# Patient Record
Sex: Male | Born: 1950 | Race: White | Hispanic: No | Marital: Married | Smoking: Never smoker
Health system: Southern US, Community
[De-identification: ages and names within clinical notes are randomized; demographics above are authoritative.]

## PROBLEM LIST (undated history)

## (undated) DIAGNOSIS — E785 Hyperlipidemia, unspecified: Secondary | ICD-10-CM

## (undated) DIAGNOSIS — N189 Chronic kidney disease, unspecified: Secondary | ICD-10-CM

## (undated) DIAGNOSIS — F1020 Alcohol dependence, uncomplicated: Secondary | ICD-10-CM

## (undated) DIAGNOSIS — B20 Human immunodeficiency virus [HIV] disease: Secondary | ICD-10-CM

## (undated) DIAGNOSIS — J189 Pneumonia, unspecified organism: Secondary | ICD-10-CM

## (undated) DIAGNOSIS — I10 Essential (primary) hypertension: Secondary | ICD-10-CM

## (undated) DIAGNOSIS — N529 Male erectile dysfunction, unspecified: Secondary | ICD-10-CM

## (undated) HISTORY — PX: BACK SURGERY: SHX140

## (undated) HISTORY — DX: Male erectile dysfunction, unspecified: N52.9

## (undated) HISTORY — DX: Hyperlipidemia, unspecified: E78.5

## (undated) HISTORY — DX: Pneumonia, unspecified organism: J18.9

## (undated) HISTORY — DX: Essential (primary) hypertension: I10

## (undated) HISTORY — PX: TONSILLECTOMY: SUR1361

## (undated) HISTORY — DX: Human immunodeficiency virus (HIV) disease: B20

## (undated) HISTORY — DX: Alcohol dependence, uncomplicated: F10.20

---

## 2005-11-20 ENCOUNTER — Encounter (INDEPENDENT_AMBULATORY_CARE_PROVIDER_SITE_OTHER): Payer: Self-pay | Admitting: *Deleted

## 2005-11-20 LAB — CONVERTED CEMR LAB: CD4 Count: 427 microliters

## 2006-01-08 ENCOUNTER — Ambulatory Visit: Payer: Self-pay | Admitting: Internal Medicine

## 2006-01-08 ENCOUNTER — Encounter (INDEPENDENT_AMBULATORY_CARE_PROVIDER_SITE_OTHER): Payer: Self-pay | Admitting: *Deleted

## 2006-01-08 ENCOUNTER — Encounter: Admission: RE | Admit: 2006-01-08 | Discharge: 2006-01-08 | Payer: Self-pay | Admitting: Internal Medicine

## 2006-01-23 ENCOUNTER — Ambulatory Visit: Payer: Self-pay | Admitting: Internal Medicine

## 2006-02-26 ENCOUNTER — Encounter: Admission: RE | Admit: 2006-02-26 | Discharge: 2006-02-26 | Payer: Self-pay | Admitting: Internal Medicine

## 2006-02-26 ENCOUNTER — Ambulatory Visit: Payer: Self-pay | Admitting: Internal Medicine

## 2006-02-26 ENCOUNTER — Encounter (INDEPENDENT_AMBULATORY_CARE_PROVIDER_SITE_OTHER): Payer: Self-pay | Admitting: *Deleted

## 2006-02-26 LAB — CONVERTED CEMR LAB
ALT: 31 units/L (ref 0–53)
AST: 21 units/L (ref 0–37)
Alkaline Phosphatase: 75 units/L (ref 39–117)
Basophils Absolute: 0 10*3/uL (ref 0.0–0.1)
Basophils Relative: 0 % (ref 0–1)
CD4 Count: 320 microliters
Eosinophils Absolute: 0.9 10*3/uL — ABNORMAL HIGH (ref 0.0–0.7)
Eosinophils Relative: 11 % — ABNORMAL HIGH (ref 0–5)
Glucose, Bld: 95 mg/dL (ref 70–99)
HCT: 46.5 % (ref 39.0–52.0)
HIV 1 RNA Quant: 2930 copies/mL
HIV-1 RNA Quant, Log: 3.47 — ABNORMAL HIGH (ref <1.70)
MCV: 83.2 fL (ref 78.0–100.0)
Platelets: 245 10*3/uL (ref 150–400)
RDW: 14.8 % — ABNORMAL HIGH (ref 11.5–14.0)
Sodium: 140 meq/L (ref 135–145)
Total Bilirubin: 0.3 mg/dL (ref 0.3–1.2)
Total Protein: 7.4 g/dL (ref 6.0–8.3)

## 2006-03-17 ENCOUNTER — Ambulatory Visit: Payer: Self-pay | Admitting: Internal Medicine

## 2006-04-06 ENCOUNTER — Encounter: Payer: Self-pay | Admitting: Internal Medicine

## 2006-04-20 ENCOUNTER — Encounter (INDEPENDENT_AMBULATORY_CARE_PROVIDER_SITE_OTHER): Payer: Self-pay | Admitting: *Deleted

## 2006-04-20 LAB — CONVERTED CEMR LAB

## 2006-05-03 ENCOUNTER — Encounter (INDEPENDENT_AMBULATORY_CARE_PROVIDER_SITE_OTHER): Payer: Self-pay | Admitting: *Deleted

## 2006-06-02 ENCOUNTER — Encounter: Admission: RE | Admit: 2006-06-02 | Discharge: 2006-06-02 | Payer: Self-pay | Admitting: Internal Medicine

## 2006-06-02 ENCOUNTER — Ambulatory Visit: Payer: Self-pay | Admitting: Internal Medicine

## 2006-06-02 LAB — CONVERTED CEMR LAB
AST: 26 units/L (ref 0–37)
BUN: 10 mg/dL (ref 6–23)
Basophils Relative: 1 % (ref 0–1)
CD4 Count: 430 microliters
CO2: 22 meq/L (ref 19–32)
Calcium: 9.6 mg/dL (ref 8.4–10.5)
Chloride: 108 meq/L (ref 96–112)
Creatinine, Ser: 0.98 mg/dL (ref 0.40–1.50)
HIV 1 RNA Quant: 367 copies/mL — ABNORMAL HIGH (ref <50)
HIV-1 RNA Quant, Log: 2.56 — ABNORMAL HIGH (ref <1.70)
Hemoglobin: 15.3 g/dL (ref 13.0–17.0)
MCHC: 34.8 g/dL (ref 30.0–36.0)
Monocytes Absolute: 0.5 10*3/uL (ref 0.2–0.7)
Monocytes Relative: 8 % (ref 3–11)
Neutro Abs: 3.2 10*3/uL (ref 1.7–7.7)
RBC: 5.15 M/uL (ref 4.22–5.81)

## 2006-06-16 ENCOUNTER — Encounter (INDEPENDENT_AMBULATORY_CARE_PROVIDER_SITE_OTHER): Payer: Self-pay | Admitting: *Deleted

## 2006-06-16 ENCOUNTER — Ambulatory Visit: Payer: Self-pay | Admitting: Internal Medicine

## 2006-06-16 DIAGNOSIS — G4700 Insomnia, unspecified: Secondary | ICD-10-CM | POA: Insufficient documentation

## 2006-06-16 DIAGNOSIS — B20 Human immunodeficiency virus [HIV] disease: Secondary | ICD-10-CM | POA: Insufficient documentation

## 2006-07-16 ENCOUNTER — Telehealth: Payer: Self-pay | Admitting: Internal Medicine

## 2006-07-30 ENCOUNTER — Encounter: Admission: RE | Admit: 2006-07-30 | Discharge: 2006-07-30 | Payer: Self-pay | Admitting: Internal Medicine

## 2006-07-30 ENCOUNTER — Ambulatory Visit: Payer: Self-pay | Admitting: Internal Medicine

## 2006-07-30 LAB — CONVERTED CEMR LAB
AST: 21 units/L (ref 0–37)
BUN: 20 mg/dL (ref 6–23)
Basophils Absolute: 0 10*3/uL (ref 0.0–0.1)
Basophils Relative: 0 % (ref 0–1)
CD4 Count: 430 microliters
Calcium: 9.7 mg/dL (ref 8.4–10.5)
Chloride: 110 meq/L (ref 96–112)
Creatinine, Ser: 1.27 mg/dL (ref 0.40–1.50)
Eosinophils Absolute: 0.2 10*3/uL (ref 0.0–0.7)
Lymphocytes Relative: 31 % (ref 12–46)
Lymphs Abs: 1.6 10*3/uL (ref 0.7–3.3)
Neutro Abs: 2.9 10*3/uL (ref 1.7–7.7)
Neutrophils Relative %: 55 % (ref 43–77)
Platelets: 262 10*3/uL (ref 150–400)
Total Bilirubin: 0.4 mg/dL (ref 0.3–1.2)
WBC: 5.2 10*3/uL (ref 4.0–10.5)

## 2006-08-14 ENCOUNTER — Ambulatory Visit: Payer: Self-pay | Admitting: Internal Medicine

## 2006-08-14 DIAGNOSIS — F528 Other sexual dysfunction not due to a substance or known physiological condition: Secondary | ICD-10-CM | POA: Insufficient documentation

## 2006-09-24 ENCOUNTER — Encounter (INDEPENDENT_AMBULATORY_CARE_PROVIDER_SITE_OTHER): Payer: Self-pay | Admitting: *Deleted

## 2006-09-25 ENCOUNTER — Encounter: Payer: Self-pay | Admitting: Licensed Clinical Social Worker

## 2006-09-25 ENCOUNTER — Ambulatory Visit: Payer: Self-pay | Admitting: Internal Medicine

## 2006-10-13 ENCOUNTER — Telehealth: Payer: Self-pay | Admitting: Internal Medicine

## 2006-12-09 ENCOUNTER — Encounter: Admission: RE | Admit: 2006-12-09 | Discharge: 2006-12-09 | Payer: Self-pay | Admitting: Internal Medicine

## 2006-12-09 ENCOUNTER — Ambulatory Visit: Payer: Self-pay | Admitting: Internal Medicine

## 2006-12-09 LAB — CONVERTED CEMR LAB
Albumin: 4.6 g/dL (ref 3.5–5.2)
CO2: 19 meq/L (ref 19–32)
Eosinophils Relative: 4 % (ref 0–5)
Glucose, Bld: 98 mg/dL (ref 70–99)
HCT: 45.9 % (ref 39.0–52.0)
HIV 1 RNA Quant: 75 copies/mL — ABNORMAL HIGH (ref <50)
HIV-1 RNA Quant, Log: 1.88 — ABNORMAL HIGH (ref <1.70)
Lymphocytes Relative: 26 % (ref 12–46)
Lymphs Abs: 1.1 10*3/uL (ref 0.7–3.3)
Monocytes Relative: 11 % (ref 3–11)
Neutrophils Relative %: 59 % (ref 43–77)
Platelets: 228 10*3/uL (ref 150–400)
Potassium: 4.2 meq/L (ref 3.5–5.3)
RBC: 5.1 M/uL (ref 4.22–5.81)
Sodium: 139 meq/L (ref 135–145)
Total Protein: 7.2 g/dL (ref 6.0–8.3)
WBC: 4.3 10*3/uL (ref 4.0–10.5)

## 2006-12-29 ENCOUNTER — Ambulatory Visit: Payer: Self-pay | Admitting: Internal Medicine

## 2007-02-23 ENCOUNTER — Encounter: Payer: Self-pay | Admitting: Internal Medicine

## 2007-03-16 ENCOUNTER — Encounter: Admission: RE | Admit: 2007-03-16 | Discharge: 2007-03-16 | Payer: Self-pay | Admitting: Internal Medicine

## 2007-03-16 ENCOUNTER — Ambulatory Visit: Payer: Self-pay | Admitting: Internal Medicine

## 2007-03-16 LAB — CONVERTED CEMR LAB
ALT: 38 units/L (ref 0–53)
Basophils Absolute: 0 10*3/uL (ref 0.0–0.1)
CO2: 19 meq/L (ref 19–32)
Calcium: 9.4 mg/dL (ref 8.4–10.5)
Chloride: 105 meq/L (ref 96–112)
HIV 1 RNA Quant: <50 copies/mL (ref <50)
Lymphocytes Relative: 28 % (ref 12–46)
Neutro Abs: 2.7 10*3/uL (ref 1.7–7.7)
Neutrophils Relative %: 58 % (ref 43–77)
Platelets: 254 10*3/uL (ref 150–400)
Potassium: 4.3 meq/L (ref 3.5–5.3)
RDW: 13.8 % (ref 11.5–15.5)
Sodium: 137 meq/L (ref 135–145)
Total Bilirubin: 0.3 mg/dL (ref 0.3–1.2)
Total Protein: 7.4 g/dL (ref 6.0–8.3)

## 2007-03-31 ENCOUNTER — Ambulatory Visit: Payer: Self-pay | Admitting: Internal Medicine

## 2007-04-16 ENCOUNTER — Telehealth: Payer: Self-pay | Admitting: Internal Medicine

## 2007-05-19 ENCOUNTER — Telehealth: Payer: Self-pay | Admitting: Internal Medicine

## 2007-06-30 ENCOUNTER — Ambulatory Visit: Payer: Self-pay | Admitting: Internal Medicine

## 2007-06-30 ENCOUNTER — Encounter: Admission: RE | Admit: 2007-06-30 | Discharge: 2007-06-30 | Payer: Self-pay | Admitting: Internal Medicine

## 2007-06-30 LAB — CONVERTED CEMR LAB
ALT: 32 units/L (ref 0–53)
AST: 22 units/L (ref 0–37)
Alkaline Phosphatase: 76 units/L (ref 39–117)
Basophils Absolute: 0 10*3/uL (ref 0.0–0.1)
Basophils Relative: 0 % (ref 0–1)
Chloride: 108 meq/L (ref 96–112)
Creatinine, Ser: 1.1 mg/dL (ref 0.40–1.50)
Eosinophils Relative: 2 % (ref 0–5)
HIV 1 RNA Quant: <50 copies/mL (ref <50)
Hemoglobin: 14.9 g/dL (ref 13.0–17.0)
LDL Cholesterol: 143 mg/dL — ABNORMAL HIGH (ref 0–99)
MCHC: 35.1 g/dL (ref 30.0–36.0)
Monocytes Absolute: 0.4 10*3/uL (ref 0.1–1.0)
Neutro Abs: 3.1 10*3/uL (ref 1.7–7.7)
PSA: 2.05 ng/mL (ref 0.10–4.00)
RDW: 14.4 % (ref 11.5–15.5)
Total Bilirubin: 0.5 mg/dL (ref 0.3–1.2)
Total CHOL/HDL Ratio: 5.3
VLDL: 34 mg/dL (ref 0–40)

## 2007-07-20 ENCOUNTER — Ambulatory Visit: Payer: Self-pay | Admitting: Internal Medicine

## 2007-07-20 ENCOUNTER — Encounter (INDEPENDENT_AMBULATORY_CARE_PROVIDER_SITE_OTHER): Payer: Self-pay | Admitting: *Deleted

## 2007-07-20 DIAGNOSIS — E785 Hyperlipidemia, unspecified: Secondary | ICD-10-CM | POA: Insufficient documentation

## 2007-10-19 ENCOUNTER — Ambulatory Visit: Payer: Self-pay | Admitting: Internal Medicine

## 2007-10-19 LAB — CONVERTED CEMR LAB
AST: 24 units/L (ref 0–37)
Albumin: 4.7 g/dL (ref 3.5–5.2)
Alkaline Phosphatase: 64 units/L (ref 39–117)
BUN: 12 mg/dL (ref 6–23)
Basophils Relative: 0 % (ref 0–1)
Eosinophils Absolute: 0.1 10*3/uL (ref 0.0–0.7)
Eosinophils Relative: 2 % (ref 0–5)
HDL: 47 mg/dL (ref >39)
HIV-1 RNA Quant, Log: <1.7 (ref <1.70)
LDL Cholesterol: 120 mg/dL — ABNORMAL HIGH (ref 0–99)
MCHC: 34.5 g/dL (ref 30.0–36.0)
MCV: 88.3 fL (ref 78.0–100.0)
Neutrophils Relative %: 63 % (ref 43–77)
Platelets: 241 10*3/uL (ref 150–400)
Potassium: 4.2 meq/L (ref 3.5–5.3)
Sodium: 142 meq/L (ref 135–145)
Total Bilirubin: 0.3 mg/dL (ref 0.3–1.2)
Total Protein: 7.2 g/dL (ref 6.0–8.3)
VLDL: 61 mg/dL — ABNORMAL HIGH (ref 0–40)

## 2007-11-09 ENCOUNTER — Ambulatory Visit: Payer: Self-pay | Admitting: Internal Medicine

## 2008-02-07 ENCOUNTER — Ambulatory Visit: Payer: Self-pay | Admitting: Internal Medicine

## 2008-02-07 LAB — CONVERTED CEMR LAB
ALT: 38 units/L (ref 0–53)
AST: 22 units/L (ref 0–37)
Alkaline Phosphatase: 62 units/L (ref 39–117)
Basophils Absolute: 0 10*3/uL (ref 0.0–0.1)
Basophils Relative: 1 % (ref 0–1)
Cholesterol: 231 mg/dL — ABNORMAL HIGH (ref 0–200)
Creatinine, Ser: 1.09 mg/dL (ref 0.40–1.50)
Eosinophils Absolute: 0.2 10*3/uL (ref 0.0–0.7)
Eosinophils Relative: 3 % (ref 0–5)
HCT: 44.4 % (ref 39.0–52.0)
HIV-1 RNA Quant, Log: <1.68 (ref <1.68)
Hemoglobin: 14.8 g/dL (ref 13.0–17.0)
LDL Cholesterol: 145 mg/dL — ABNORMAL HIGH (ref 0–99)
Lymphocytes Relative: 26 % (ref 12–46)
MCHC: 33.3 g/dL (ref 30.0–36.0)
Monocytes Absolute: 0.4 10*3/uL (ref 0.1–1.0)
Platelets: 229 10*3/uL (ref 150–400)
RDW: 14.1 % (ref 11.5–15.5)
Sodium: 141 meq/L (ref 135–145)
Total Bilirubin: 0.3 mg/dL (ref 0.3–1.2)
Total CHOL/HDL Ratio: 5.6
Total Protein: 6.8 g/dL (ref 6.0–8.3)
VLDL: 45 mg/dL — ABNORMAL HIGH (ref 0–40)

## 2008-02-22 ENCOUNTER — Ambulatory Visit: Payer: Self-pay | Admitting: Internal Medicine

## 2008-03-08 ENCOUNTER — Encounter: Payer: Self-pay | Admitting: Internal Medicine

## 2008-03-16 ENCOUNTER — Encounter: Payer: Self-pay | Admitting: Internal Medicine

## 2008-05-01 ENCOUNTER — Ambulatory Visit: Payer: Self-pay | Admitting: Infectious Diseases

## 2008-05-01 ENCOUNTER — Encounter: Payer: Self-pay | Admitting: Internal Medicine

## 2008-05-01 LAB — CONVERTED CEMR LAB
AST: 14 units/L (ref 0–37)
Alkaline Phosphatase: 62 units/L (ref 39–117)
BUN: 15 mg/dL (ref 6–23)
Band Neutrophils: 0 % (ref 0–10)
Basophils Absolute: 0 10*3/uL (ref 0.0–0.1)
Basophils Relative: 1 % (ref 0–1)
Eosinophils Relative: 3 % (ref 0–5)
Glucose, Bld: 99 mg/dL (ref 70–99)
HCT: 46.1 % (ref 39.0–52.0)
HDL: 44 mg/dL (ref >39)
LDL Cholesterol: 140 mg/dL — ABNORMAL HIGH (ref 0–99)
Lymphocytes Relative: 13 % (ref 12–46)
Monocytes Absolute: 0.6 10*3/uL (ref 0.1–1.0)
Neutro Abs: 4.6 10*3/uL (ref 1.7–7.7)
Platelets: 205 10*3/uL (ref 150–400)
RDW: 14.1 % (ref 11.5–15.5)
Sodium: 139 meq/L (ref 135–145)
Total Bilirubin: 0.4 mg/dL (ref 0.3–1.2)
Triglycerides: 170 mg/dL — ABNORMAL HIGH (ref <150)
VLDL: 34 mg/dL (ref 0–40)
WBC: 6.2 10*3/uL (ref 4.0–10.5)

## 2008-05-22 ENCOUNTER — Ambulatory Visit: Payer: Self-pay | Admitting: Internal Medicine

## 2008-05-22 LAB — CONVERTED CEMR LAB
Alkaline Phosphatase: 59 units/L (ref 39–117)
BUN: 17 mg/dL (ref 6–23)
Basophils Absolute: 0 10*3/uL (ref 0.0–0.1)
CO2: 21 meq/L (ref 19–32)
Cholesterol: 192 mg/dL (ref 0–200)
Creatinine, Ser: 1.12 mg/dL (ref 0.40–1.50)
GFR calc Af Amer: >60 mL/min (ref >60)
GFR calc non Af Amer: >60 mL/min (ref >60)
Glucose, Bld: 98 mg/dL (ref 70–99)
HDL: 47 mg/dL (ref >39)
Hemoglobin: 15 g/dL (ref 13.0–17.0)
Lymphocytes Relative: 29 % (ref 12–46)
Lymphs Abs: 1.3 10*3/uL (ref 0.7–4.0)
Monocytes Absolute: 0.4 10*3/uL (ref 0.1–1.0)
Neutro Abs: 2.6 10*3/uL (ref 1.7–7.7)
RBC: 4.92 M/uL (ref 4.22–5.81)
RDW: 14 % (ref 11.5–15.5)
Total Bilirubin: 0.3 mg/dL (ref 0.3–1.2)
Total Protein: 7.2 g/dL (ref 6.0–8.3)
Triglycerides: 225 mg/dL — ABNORMAL HIGH (ref <150)
VLDL: 45 mg/dL — ABNORMAL HIGH (ref 0–40)
WBC: 4.4 10*3/uL (ref 4.0–10.5)

## 2008-06-06 ENCOUNTER — Ambulatory Visit: Payer: Self-pay | Admitting: Internal Medicine

## 2008-06-29 ENCOUNTER — Telehealth: Payer: Self-pay | Admitting: Internal Medicine

## 2008-09-04 ENCOUNTER — Ambulatory Visit: Payer: Self-pay | Admitting: Internal Medicine

## 2008-09-04 LAB — CONVERTED CEMR LAB
Albumin: 4.7 g/dL (ref 3.5–5.2)
CO2: 21 meq/L (ref 19–32)
Cholesterol: 254 mg/dL — ABNORMAL HIGH (ref 0–200)
Glucose, Bld: 92 mg/dL (ref 70–99)
HDL: 38 mg/dL — ABNORMAL LOW (ref >39)
Lymphs Abs: 1.4 10*3/uL (ref 0.7–4.0)
Monocytes Relative: 9 % (ref 3–12)
Neutro Abs: 3.3 10*3/uL (ref 1.7–7.7)
Neutrophils Relative %: 63 % (ref 43–77)
Potassium: 4.2 meq/L (ref 3.5–5.3)
RBC: 5.29 M/uL (ref 4.22–5.81)
Sodium: 140 meq/L (ref 135–145)
Total Bilirubin: 0.3 mg/dL (ref 0.3–1.2)
Total CHOL/HDL Ratio: 6.7
Total Protein: 7 g/dL (ref 6.0–8.3)
WBC: 5.3 10*3/uL (ref 4.0–10.5)

## 2008-10-06 ENCOUNTER — Ambulatory Visit: Payer: Self-pay | Admitting: Internal Medicine

## 2008-12-12 ENCOUNTER — Ambulatory Visit: Payer: Self-pay | Admitting: Internal Medicine

## 2008-12-12 LAB — CONVERTED CEMR LAB
ALT: 41 units/L (ref 0–53)
Albumin: 4.5 g/dL (ref 3.5–5.2)
CO2: 20 meq/L (ref 19–32)
Calcium: 9.2 mg/dL (ref 8.4–10.5)
Chloride: 109 meq/L (ref 96–112)
Cholesterol: 214 mg/dL — ABNORMAL HIGH (ref 0–200)
Creatinine, Ser: 1.12 mg/dL (ref 0.40–1.50)
Eosinophils Absolute: 0.1 10*3/uL (ref 0.0–0.7)
HCT: 44.7 % (ref 39.0–52.0)
Lymphs Abs: 1.1 10*3/uL (ref 0.7–4.0)
MCV: 87.6 fL (ref >=78.0)
Monocytes Relative: 9 % (ref 3–12)
Neutrophils Relative %: 63 % (ref 43–77)
Potassium: 4.2 meq/L (ref 3.5–5.3)
RBC: 5.1 M/uL (ref 4.22–5.81)
Total CHOL/HDL Ratio: 4.9
Total Protein: 7 g/dL (ref 6.0–8.3)
WBC: 4.4 10*3/uL (ref 4.0–10.5)

## 2008-12-26 ENCOUNTER — Ambulatory Visit: Payer: Self-pay | Admitting: Internal Medicine

## 2008-12-26 DIAGNOSIS — I10 Essential (primary) hypertension: Secondary | ICD-10-CM | POA: Insufficient documentation

## 2009-03-26 ENCOUNTER — Ambulatory Visit: Payer: Self-pay | Admitting: Internal Medicine

## 2009-03-26 LAB — CONVERTED CEMR LAB
Basophils Relative: 0 % (ref 0–1)
CO2: 20 meq/L (ref 19–32)
Calcium: 8.9 mg/dL (ref 8.4–10.5)
Chloride: 108 meq/L (ref 96–112)
Cholesterol: 216 mg/dL — ABNORMAL HIGH (ref 0–200)
Creatinine, Ser: 0.99 mg/dL (ref 0.40–1.50)
Eosinophils Absolute: 0.1 10*3/uL (ref 0.0–0.7)
Eosinophils Relative: 1 % (ref 0–5)
Glucose, Bld: 94 mg/dL (ref 70–99)
HCT: 44.8 % (ref 39.0–52.0)
HIV-1 RNA Quant, Log: <1.68 (ref <1.68)
Hemoglobin: 15.4 g/dL (ref 13.0–17.0)
Lymphs Abs: 1 10*3/uL (ref 0.7–4.0)
MCHC: 34.4 g/dL (ref 30.0–36.0)
MCV: 86.3 fL (ref >=78.0)
Monocytes Absolute: 0.4 10*3/uL (ref 0.1–1.0)
Monocytes Relative: 8 % (ref 3–12)
RBC: 5.19 M/uL (ref 4.22–5.81)
Total Bilirubin: 0.3 mg/dL (ref 0.3–1.2)
Total CHOL/HDL Ratio: 5.1
Triglycerides: 317 mg/dL — ABNORMAL HIGH (ref <150)
VLDL: 63 mg/dL — ABNORMAL HIGH (ref 0–40)

## 2009-04-10 ENCOUNTER — Ambulatory Visit: Payer: Self-pay | Admitting: Internal Medicine

## 2009-07-10 ENCOUNTER — Ambulatory Visit: Payer: Self-pay | Admitting: Internal Medicine

## 2009-07-10 LAB — CONVERTED CEMR LAB
AST: 22 units/L (ref 0–37)
Albumin: 4.5 g/dL (ref 3.5–5.2)
BUN: 11 mg/dL (ref 6–23)
CO2: 22 meq/L (ref 19–32)
Calcium: 9.4 mg/dL (ref 8.4–10.5)
Chloride: 107 meq/L (ref 96–112)
Creatinine, Ser: 1.04 mg/dL (ref 0.40–1.50)
Eosinophils Absolute: 0.1 10*3/uL (ref 0.0–0.7)
Eosinophils Relative: 1 % (ref 0–5)
Glucose, Bld: 98 mg/dL (ref 70–99)
HCT: 43.6 % (ref 39.0–52.0)
HIV 1 RNA Quant: <48 copies/mL (ref <48)
HIV-1 RNA Quant, Log: <1.68 (ref <1.68)
Hemoglobin: 15.2 g/dL (ref 13.0–17.0)
Lymphocytes Relative: 20 % (ref 12–46)
Lymphs Abs: 1.1 10*3/uL (ref 0.7–4.0)
MCV: 85.8 fL (ref 78.0–100.0)
Monocytes Absolute: 0.4 10*3/uL (ref 0.1–1.0)
Platelets: 243 10*3/uL (ref 150–400)
Potassium: 4.3 meq/L (ref 3.5–5.3)
RDW: 14.1 % (ref 11.5–15.5)
WBC: 5.8 10*3/uL (ref 4.0–10.5)

## 2009-07-11 ENCOUNTER — Telehealth: Payer: Self-pay | Admitting: Internal Medicine

## 2009-07-24 ENCOUNTER — Ambulatory Visit: Payer: Self-pay | Admitting: Internal Medicine

## 2009-07-30 ENCOUNTER — Encounter (INDEPENDENT_AMBULATORY_CARE_PROVIDER_SITE_OTHER): Payer: Self-pay | Admitting: *Deleted

## 2009-10-10 ENCOUNTER — Ambulatory Visit: Payer: Self-pay | Admitting: Internal Medicine

## 2009-10-10 LAB — CONVERTED CEMR LAB
Albumin: 4.8 g/dL (ref 3.5–5.2)
Alkaline Phosphatase: 68 units/L (ref 39–117)
BUN: 19 mg/dL (ref 6–23)
Calcium: 9.5 mg/dL (ref 8.4–10.5)
Chloride: 109 meq/L (ref 96–112)
Eosinophils Absolute: 0 10*3/uL (ref 0.0–0.7)
Glucose, Bld: 98 mg/dL (ref 70–99)
HIV-1 RNA Quant, Log: <1.68 (ref <1.68)
LDL Cholesterol: 153 mg/dL — ABNORMAL HIGH (ref 0–99)
Lymphocytes Relative: 25 % (ref 12–46)
Lymphs Abs: 1.4 10*3/uL (ref 0.7–4.0)
MCV: 89.1 fL (ref 78.0–100.0)
Monocytes Relative: 8 % (ref 3–12)
Neutro Abs: 3.6 10*3/uL (ref 1.7–7.7)
Neutrophils Relative %: 66 % (ref 43–77)
Platelets: 229 10*3/uL (ref 150–400)
Potassium: 4.2 meq/L (ref 3.5–5.3)
RBC: 5.22 M/uL (ref 4.22–5.81)
Sodium: 141 meq/L (ref 135–145)
Total Protein: 7.4 g/dL (ref 6.0–8.3)
Triglycerides: 249 mg/dL — ABNORMAL HIGH (ref <150)
WBC: 5.5 10*3/uL (ref 4.0–10.5)

## 2009-10-24 ENCOUNTER — Ambulatory Visit: Payer: Self-pay | Admitting: Internal Medicine

## 2009-11-23 ENCOUNTER — Telehealth: Payer: Self-pay | Admitting: Internal Medicine

## 2010-01-23 ENCOUNTER — Ambulatory Visit: Payer: Self-pay | Admitting: Internal Medicine

## 2010-01-23 LAB — CONVERTED CEMR LAB
ALT: 34 units/L (ref 0–53)
Albumin: 4.3 g/dL (ref 3.5–5.2)
Alkaline Phosphatase: 57 units/L (ref 39–117)
Basophils Absolute: 0 10*3/uL (ref 0.0–0.1)
Basophils Relative: 0 % (ref 0–1)
CO2: 23 meq/L (ref 19–32)
Cholesterol: 233 mg/dL — ABNORMAL HIGH (ref 0–200)
Eosinophils Absolute: 0.1 10*3/uL (ref 0.0–0.7)
LDL Cholesterol: 139 mg/dL — ABNORMAL HIGH (ref 0–99)
MCHC: 34.2 g/dL (ref 30.0–36.0)
MCV: 87.3 fL (ref 78.0–100.0)
Neutrophils Relative %: 67 % (ref 43–77)
Platelets: 221 10*3/uL (ref 150–400)
Potassium: 4.2 meq/L (ref 3.5–5.3)
RDW: 14.1 % (ref 11.5–15.5)
Sodium: 143 meq/L (ref 135–145)
Total Bilirubin: 0.3 mg/dL (ref 0.3–1.2)
Total Protein: 6.4 g/dL (ref 6.0–8.3)
Triglycerides: 276 mg/dL — ABNORMAL HIGH (ref <150)

## 2010-03-06 ENCOUNTER — Ambulatory Visit
Admission: RE | Admit: 2010-03-06 | Discharge: 2010-03-06 | Payer: Self-pay | Source: Home / Self Care | Attending: Internal Medicine | Admitting: Internal Medicine

## 2010-03-26 NOTE — Miscellaneous (Signed)
Summary: clinical update/ryan white  Clinical Lists Changes  Observations: Added new observation of PCTFPL: 305.08  (07/30/2009 14:47) Added new observation of HOUSEINCOME: 16109  (07/30/2009 14:47) Added new observation of FINASSESSDT: 07/24/2009  (07/30/2009 14:47) Added new observation of YEARLYEXPEN: 60454  (07/30/2009 14:47) Added new observation of RW VITAL STA: Active  (07/30/2009 14:47)

## 2010-03-26 NOTE — Assessment & Plan Note (Signed)
Summary: 41month f/u/vs   Primary Provider:  Philipp Deputy  CC:  follow-up visit and lab results.  History of Present Illness: Pt doing well.  He missed his BP medication and Atripla this weekend because he was moving his son.  Preventive Screening-Counseling & Management  Alcohol-Tobacco     Alcohol drinks/day: 2     Alcohol type: beer     Smoking Status: never     Passive Smoke Exposure: yes  Caffeine-Diet-Exercise     Caffeine use/day: yes, 2 cups of coffee in the mornings     Does Patient Exercise: yes     Type of exercise: active with grandkids     Exercise (avg: min/session): 30-60     Times/week: <3  Hep-HIV-STD-Contraception     HIV Risk: no  Safety-Violence-Falls     Seat Belt Use: yes      Sexual History:  currently monogamous.        Drug Use:  never.        Blood Transfusions:  no.        Travel History:  NONE.     Updated Prior Medication List: ATRIPLA 600-200-300 MG TABS (EFAVIRENZ-EMTRICITAB-TENOFOVIR) Take 1 tablet by mouth at bedtime ATENOLOL 100 MG  TABS (ATENOLOL) Take 1 tablet by mouth once a day ATIVAN 0.5 MG TABS (LORAZEPAM) Take 1 tablet by mouth at bedtime  Current Allergies (reviewed today): ! SULFA Past History:  Past Medical History: Last updated: 05/01/2008 HIV disease Depression Insomnia Erectile dysfunction Laminectomy  Review of Systems       The patient complains of headaches.  The patient denies anorexia, fever, weight loss, and chest pain.    Vital Signs:  Patient profile:   60 year old male Height:      68 inches (172.72 cm) Weight:      188.0 pounds (85.45 kg) BMI:     28.69 Temp:     98.0 degrees F (36.67 degrees C) oral Pulse rate:   75 / minute BP sitting:   162 / 90  (right arm)  Vitals Entered By: Wendall Mola CMA Duncan Dull) (Jul 24, 2009 3:49 PM) CC: follow-up visit, lab results Is Patient Diabetic? No Pain Assessment Patient in pain? no      Nutritional Status BMI of 25 - 29 =  overweight Nutritional Status Detail appetite "good"  Does patient need assistance? Functional Status Self care Ambulation Normal Comments 2 missed doses of meds per patient   Physical Exam  General:  alert, well-developed, well-nourished, and well-hydrated.   Head:  normocephalic and atraumatic.   Mouth:  pharynx pink and moist.   Lungs:  normal breath sounds.      Impression & Recommendations:  Problem # 1:  HIV DISEASE (ICD-042) Pt.s most recent CD4ct was 520 and VL<48  .  Pt instructed to continue the current antiretroviral regimen.  Pt encouraged to take medication regularly and not miss doses.  Pt will f/u in 3 months for repeat blood work and will see me 2 weeks later.  Diagnostics Reviewed:  CD4: 520 (07/11/2009)   WBC: 5.8 (07/10/2009)   PMN (bands): 0 (05/01/2008)   Hgb: 15.2 (07/10/2009)   HCT: 43.6 (07/10/2009)   Platelets: 243 (07/10/2009) HIV-1 RNA: <48 copies/mL (07/10/2009)   HBSAg: NO (04/20/2006)  Problem # 2:  HYPERTENSION, BENIGN (ICD-401.1) discussed need to take his BP meds every day. His updated medication list for this problem includes:    Atenolol 100 Mg Tabs (Atenolol) .Marland Kitchen... Take 1 tablet by mouth once  a day  Other Orders: Est. Patient Level III ([address]) Future Orders: T-CD4SP ([facility]) (CD4SP) ... 10/22/2009 T-HIV Viral Load [id number]) ... 10/22/2009 T-Comprehensive Metabolic Panel [id number]) ... 10/22/2009 T-CBC w/Diff ([id number]) ... 10/22/2009 T-RPR (Syphilis) [id number]) ... 10/22/2009 T-Lipid Profile [id number]) ... 10/22/2009  Patient Instructions: 1)  Please schedule a follow-up appointment in 3 months, 2 weeks after labs.

## 2010-03-26 NOTE — Progress Notes (Signed)
Summary: new pharmacy-new prescription  Phone Note From Pharmacy   [facility] Details for Reason: need new prescription--new patient to pharmacy Summary of Call: received [facility] requesting a new prescription for Atripla.  this patient is new to their pharmacy.  there phone number is [phone] Initial call taken by: Paulo Fruit  BS,CPht II,MPH,  November 23, 2009 11:22 AM    Prescriptions: ATRIPLA 600-200-300 MG TABS (EFAVIRENZ-EMTRICITAB-TENOFOVIR) Take 1 tablet by mouth at bedtime  #30 Tablet x 5   Entered by:   Paulo Fruit  BS,CPht II,MPH   Authorized by:   Yisroel Ramming MD   Signed by:   Paulo Fruit  BS,CPht II,MPH on 11/23/2009   Method used:   Telephoned to ...       [facility]             , [place]         Ph:        Fax: [phone]   RxID:   [id number]  spoke to [facility] who read the prescription back/mld Paulo Fruit  BS,CPht II,MPH  November 23, 2009 11:28 AM

## 2010-03-26 NOTE — Progress Notes (Signed)
Summary: Request for Ativan  Phone Note From Pharmacy   Caller: [facility] [address] Reason for Call: Needs renewal Summary of Call: Requesting refill on the Ativan.  Patient has an appt May 31. Initial call taken by: Paulo Fruit  BS,CPht II,MPH,  Jul 11, 2009 9:31 AM  Follow-up for Phone Call        ok x 1 Follow-up by: Yisroel Ramming MD,  Jul 11, 2009 9:54 AM

## 2010-03-26 NOTE — Assessment & Plan Note (Signed)
Summary: 79month f/u [mkj]   Primary Gweneth Fredlund:  Philipp Deputy  CC:  follow-up visit and lab results.  History of Present Illness: Pt here for f/u. No missed doses.  Preventive Screening-Counseling & Management  Alcohol-Tobacco     Alcohol drinks/day: 2     Alcohol type: beer     Smoking Status: never     Passive Smoke Exposure: yes  Caffeine-Diet-Exercise     Caffeine use/day: yes, 2 cups of coffee in the mornings     Does Patient Exercise: yes     Type of exercise: active with grandkids     Exercise (avg: min/session): 30-60     Times/week: <3  Hep-HIV-STD-Contraception     HIV Risk: no  Safety-Violence-Falls     Seat Belt Use: yes      Sexual History:  currently monogamous.        Drug Use:  never.        Blood Transfusions:  no.        Travel History:  NONE.     Updated Prior Medication List: ATRIPLA 600-200-300 MG TABS (EFAVIRENZ-EMTRICITAB-TENOFOVIR) Take 1 tablet by mouth at bedtime ATENOLOL 100 MG  TABS (ATENOLOL) Take 1 tablet by mouth once a day  Current Allergies (reviewed today): ! SULFA Past History:  Past Medical History: Last updated: 05/01/2008 HIV disease Depression Insomnia Erectile dysfunction Laminectomy  Review of Systems  The patient denies anorexia, fever, and weight loss.    Vital Signs:  Patient profile:   60 year old male Height:      68 inches (172.72 cm) Weight:      185.8 pounds (84.45 kg) BMI:     28.35 Temp:     98.3 degrees F (36.83 degrees C) oral Pulse rate:   73 / minute BP sitting:   159 / 90  (left arm)  Vitals Entered By: Wendall Mola CMA Duncan Dull) (October 24, 2009 3:50 PM) CC: follow-up visit, lab results Is Patient Diabetic? No Pain Assessment Patient in pain? no      Nutritional Status BMI of 25 - 29 = overweight Nutritional Status Detail appetite "good"  Does patient need assistance? Functional Status Self care Ambulation Normal Comments no missed doses of meds per patient   Physical  Exam  General:  alert, well-developed, well-nourished, and well-hydrated.   Head:  normocephalic and atraumatic.   Lungs:  normal breath sounds.      Impression & Recommendations:  Problem # 1:  HIV DISEASE (ICD-042) Pt.s most recent CD4ct was 520 and VL <48 .  Pt instructed to continue the current antiretroviral regimen.  Pt encouraged to take medication regularly and not miss doses.  Pt will f/u in 3 months for repeat blood work and will see me 2 weeks later.  Diagnostics Reviewed:  CD4: 520 (10/11/2009)   WBC: 5.5 (10/10/2009)   PMN (bands): 0 (05/01/2008)   Hgb: 15.8 (10/10/2009)   HCT: 46.5 (10/10/2009)   Platelets: 229 (10/10/2009) HIV-1 RNA: <48 copies/mL (10/10/2009)   HBSAg: NO (04/20/2006)  Problem # 2:  HYPERLIPIDEMIA (ICD-272.4) discussed diet he has been eating a lot of take out food lately - will cut back  Medications Added to Medication List This Visit: 1)  Ativan 0.5 Mg Tabs (Lorazepam) .... Take 1 tablet by mouth at bedtime  Other Orders: Est. Patient Level III (04540) Future Orders: T-CD4SP ([facility]) (CD4SP) ... 01/22/2010 T-HIV Viral Load [id number]) ... 01/22/2010 T-Comprehensive Metabolic Panel [id number]) ... 01/22/2010 T-CBC w/Diff ([id number]) ... 01/22/2010 T-Lipid Profile [id number]) .Marland KitchenMarland Kitchen  01/22/2010  Patient Instructions: 1)  Please schedule a follow-up appointment in 3 months, 2 weeks after labs  Prescriptions: ATIVAN 0.5 MG TABS (LORAZEPAM) Take 1 tablet by mouth at bedtime  #30 x 0   Entered and Authorized by:   Yisroel Ramming MD   Signed by:   Yisroel Ramming MD on 10/24/2009   Method used:   Print then Give to Patient   RxID:   [id number] ATRIPLA 600-200-300 MG TABS (EFAVIRENZ-EMTRICITAB-TENOFOVIR) Take 1 tablet by mouth at bedtime  #30 Tablet x 5   Entered and Authorized by:   Yisroel Ramming MD   Signed by:   Yisroel Ramming MD on 10/24/2009   Method used:   Print then Give to Patient   RxID:   [id number]

## 2010-03-26 NOTE — Assessment & Plan Note (Signed)
Summary: F/U/VS   Primary Provider:  Philipp Deputy  CC:  f/u labs and headache x 1 week.  History of Present Illness: Pt here for f/u.  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 Has had a HA for the past week due to stress. No missed doses of his HIV meds.  Preventive Screening-Counseling & Management  Alcohol-Tobacco     Alcohol drinks/day: 0     Alcohol type: beer     Smoking Status: quit     Passive Smoke Exposure: yes  Caffeine-Diet-Exercise     Caffeine use/day: yes, 2 cups of coffee in the mornings     Does Patient Exercise: yes     Type of exercise: active with grandkids     Exercise (avg: min/session): 30-60     Times/week: <3   Updated Prior Medication List: ATRIPLA 600-200-300 MG TABS (EFAVIRENZ-EMTRICITAB-TENOFOVIR) Take 1 tablet by mouth at bedtime ATENOLOL 100 MG  TABS (ATENOLOL) Take 1 tablet by mouth once a day ATIVAN 0.5 MG TABS (LORAZEPAM) Take 1 tablet by mouth at bedtime VICODIN 5-500 MG TABS (HYDROCODONE-ACETAMINOPHEN) Take 1 tablet by mouth every 6 hours as needed  Current Allergies (reviewed today): ! SULFA Past History:  Past Medical History: Last updated: 05/01/2008 HIV disease Depression Insomnia Erectile dysfunction Laminectomy  Review of Systems  The patient denies anorexia, fever, and weight loss.    Vital Signs:  Patient profile:   60 year old male Height:      68 inches (172.72 cm) Weight:      184 pounds (83.64 kg) BMI:     28.08 Pulse rate:   55 / minute BP sitting:   137 / 84  (left arm)  Vitals Entered By: Starleen Arms CMA (April 10, 2009 3:36 PM) CC: f/u labs, headache x 1 week Is Patient Diabetic? No Pain Assessment Patient in pain? no      Nutritional Status BMI of 25 - 29 = overweight Nutritional Status Detail nl  Have you ever been in a relationship where you felt threatened, hurt or afraid?No   Does patient need assistance? Functional Status Self care Ambulation  Normal   Physical Exam  General:  alert, well-developed, well-nourished, and well-hydrated.   Head:  normocephalic and atraumatic.   Mouth:  pharynx pink and moist.   Lungs:  normal breath sounds.      Impression & Recommendations:  Problem # 1:  HIV DISEASE (ICD-042) Pt.s most recent CD4ct was 440 and VL <48 .  Pt instructed to continue the current antiretroviral regimen.  Pt encouraged to take medication regularly and not miss doses.  Pt will f/u in 3 months for repeat blood work and will see me 2 weeks later.  Diagnostics Reviewed:  CD4: 440 (03/27/2009)   WBC: 5.2 (03/26/2009)   PMN (bands): 0 (05/01/2008)   Hgb: 15.4 (03/26/2009)   HCT: 44.8 (03/26/2009)   Platelets: 231 (03/26/2009) HIV-1 RNA: <48 copies/mL (03/26/2009)   HBSAg: NO (04/20/2006)  Problem # 2:  HYPERLIPIDEMIA (ICD-272.4) stable continue dietary restrictions Labs Reviewed: SGOT: 25 (03/26/2009)   SGPT: 34 (03/26/2009)   HDL:42 (03/26/2009), 44 (12/12/2008)  LDL:111 (03/26/2009), 112 (12/12/2008)  Chol:216 (03/26/2009), 214 (12/12/2008)  Trig:317 (03/26/2009), 292 (12/12/2008)  Other Orders: Est. Patient Level III (27253) Future Orders: T-CD4SP ([facility]) (CD4SP) ... 07/09/2009 T-HIV Viral Load [id number]) ... 07/09/2009 T-Comprehensive Metabolic Panel [id number]) ... 07/09/2009 T-CBC w/Diff ([id number]) ... 07/09/2009  Patient Instructions: 1)  Please schedule a follow-up appointment in 3 months, 2 weeks after  labs.

## 2010-03-28 NOTE — Assessment & Plan Note (Signed)
Summary: F/U/VS   Primary Provider:  Philipp Deputy  CC:  follow-up visit, lab results, B/P elevated, and has not been taking Atenolol regularly.  History of Present Illness: patient is here for follow-up on his lab results.  He's been taking his a triple everyday.  He has not been taking his blood pressure medication every day.  He thinks his blood pressure may be elevated because he was rushing to get here on time.  Preventive Screening-Counseling & Management  Alcohol-Tobacco     Alcohol drinks/day: 2     Alcohol type: beer     Smoking Status: never     Passive Smoke Exposure: yes  Caffeine-Diet-Exercise     Caffeine use/day: yes, 2 cups of coffee in the mornings     Does Patient Exercise: yes     Type of exercise: active with grandkids     Exercise (avg: min/session): 30-60     Times/week: <3  Hep-HIV-STD-Contraception     HIV Risk: no  Safety-Violence-Falls     Seat Belt Use: yes      Sexual History:  currently monogamous.        Drug Use:  never.        Blood Transfusions:  no.        Travel History:  NONE.    Comments: pt. given condoms   Updated Prior Medication List: ATRIPLA 600-200-300 MG TABS (EFAVIRENZ-EMTRICITAB-TENOFOVIR) Take 1 tablet by mouth at bedtime ATENOLOL 100 MG  TABS (ATENOLOL) Take 1 tablet by mouth once a day ATIVAN 0.5 MG TABS (LORAZEPAM) Take 1 tablet by mouth at bedtime LIPITOR 20 MG TABS (ATORVASTATIN CALCIUM) Take 1 tablet by mouth once a day  Current Allergies (reviewed today): ! SULFA Past History:  Past Medical History: Last updated: 05/01/2008 HIV disease Depression Insomnia Erectile dysfunction Laminectomy  Review of Systems  The patient denies anorexia, fever, weight loss, chest pain, and headaches.    Vital Signs:  Patient profile:   60 year old male Height:      68 inches (172.72 cm) Weight:      179.0 pounds (81.36 kg) BMI:     27.32 Temp:     98.2 degrees F (36.78 degrees C) oral Pulse rate:   69 /  minute BP sitting:   159 / 110  (right arm)  Vitals Entered By: Wendall Mola CMA Duncan Dull) (March 06, 2010 4:11 PM) CC: follow-up visit, lab results, B/P elevated, has not been taking Atenolol regularly Is Patient Diabetic? No Pain Assessment Patient in pain? no      Nutritional Status BMI of 25 - 29 = overweight Nutritional Status Detail appetite "too good"  Have you ever been in a relationship where you felt threatened, hurt or afraid?No   Does patient need assistance? Functional Status Self care Ambulation Normal Comments no missed doses of Atripla per pt.   Physical Exam  General:  alert, well-developed, well-nourished, and well-hydrated.  alert, well-developed, well-nourished, and well-hydrated.   Head:  normocephalic and atraumatic.  normocephalic and atraumatic.   Mouth:  pharynx pink and moist.  pharynx pink and moist.   Lungs:  normal breath sounds.  normal breath sounds.   Heart:  normal rate and regular rhythm.  normal rate and regular rhythm.     Impression & Recommendations:  Problem # 1:  HIV DISEASE (ICD-042) Pt.s most recent CD4ct was 510 and VL <20 .  Pt instructed to continue the current antiretroviral regimen.  Pt encouraged to take medication regularly and not  miss doses.  Pt will f/u in 3 months for repeat blood work and will see me 2 weeks later. Influenza vaccine given.  Diagnostics Reviewed:  HIV: HIV positive - not AIDS (10/24/2009)   CD4: 510 (01/25/2010)   WBC: 5.0 (01/23/2010)   PMN (bands): 0 (05/01/2008)   Hgb: 15.0 (01/23/2010)   HCT: 43.8 (01/23/2010)   Platelets: 221 (01/23/2010) HIV-1 RNA: <20 copies/mL (01/23/2010)   HBSAg: NO (04/20/2006)  Problem # 2:  HYPERTENSION, BENIGN (ICD-401.1) Pt encouraged to take his atenolol every day. His updated medication list for this problem includes:    Atenolol 100 Mg Tabs (Atenolol) .Marland Kitchen... Take 1 tablet by mouth once a day  Problem # 3:  HYPERLIPIDEMIA (ICD-272.4)  will start lipitor and  repea labs in 3 months. His updated medication list for this problem includes:    Lipitor 20 Mg Tabs (Atorvastatin calcium) .Marland Kitchen... Take 1 tablet by mouth once a day  Labs Reviewed: SGOT: 25 (01/23/2010)   SGPT: 34 (01/23/2010)   HDL:39 (01/23/2010), 46 (10/10/2009)  LDL:139 (01/23/2010), 153 (10/10/2009)  Chol:233 (01/23/2010), 249 (10/10/2009)  Trig:276 (01/23/2010), 249 (10/10/2009)  Medications Added to Medication List This Visit: 1)  Lipitor 20 Mg Tabs (Atorvastatin calcium) .... Take 1 tablet by mouth once a day  Other Orders: Est. Patient Level III (04540) Future Orders: T-CD4SP ([facility]) (CD4SP) ... 06/04/2010 T-HIV Viral Load [id number]) ... 06/04/2010 T-Comprehensive Metabolic Panel [id number]) ... 06/04/2010 T-CBC w/Diff ([id number]) ... 06/04/2010 T-Lipid Profile [id number]) ... 06/04/2010  Patient Instructions: 1)  Please schedule a follow-up appointment in 3 months, 2 weeks after labs.  Prescriptions: ATIVAN 0.5 MG TABS (LORAZEPAM) Take 1 tablet by mouth at bedtime  #30 x 0   Entered by:   Wendall Mola CMA ( AAMA)   Authorized by:   Yisroel Ramming MD   Signed by:   Wendall Mola CMA ( AAMA) on 03/06/2010   Method used:   Telephoned to ...       [facility] [address] [phone]* (retail)       [address]       [place], [place]  [address]       Ph: [phone]       Fax: [phone]   RxID:   [id number] LIPITOR 20 MG TABS (ATORVASTATIN CALCIUM) Take 1 tablet by mouth once a day  #30 x 5   Entered by:   Wendall Mola CMA ( AAMA)   Authorized by:   Yisroel Ramming MD   Signed by:   Wendall Mola CMA ( AAMA) on 03/06/2010   Method used:   Electronically to        [facility] [phone]* (retail)       [address]       [place], [place]  [address]       Ph: [phone]       Fax: [phone]   RxID:   [id number]

## 2010-04-15 ENCOUNTER — Encounter (INDEPENDENT_AMBULATORY_CARE_PROVIDER_SITE_OTHER): Payer: Self-pay | Admitting: *Deleted

## 2010-04-23 ENCOUNTER — Telehealth: Payer: Self-pay | Admitting: *Deleted

## 2010-04-23 NOTE — Miscellaneous (Signed)
  Clinical Lists Changes 

## 2010-04-23 NOTE — Miscellaneous (Signed)
  Clinical Lists Changes  Observations: Added new observation of PCTFPL: 244.06  (04/15/2010 15:25) Added new observation of HOUSEINCOME: 19147  (04/15/2010 15:25)     net income - 35,560

## 2010-05-02 NOTE — Progress Notes (Signed)
  Phone Note Call from Patient   Caller: Patient Summary of Call: pt LM that he needed refills on his aptripla. called [facility] [phone]. gave #30 with 6 refills. LM at his home that his med refill has been done Initial call taken by: Golden Circle RN,  April 23, 2010 11:31 AM

## 2010-05-07 LAB — T-HELPER CELL (CD4) - (RCID CLINIC ONLY)
CD4 % Helper T Cell: 42 % (ref 33–55)
CD4 T Cell Abs: 510 uL (ref 400–2700)

## 2010-05-10 LAB — T-HELPER CELL (CD4) - (RCID CLINIC ONLY): CD4 T Cell Abs: 520 uL (ref 400–2700)

## 2010-05-12 LAB — T-HELPER CELL (CD4) - (RCID CLINIC ONLY): CD4 T Cell Abs: 440 uL (ref 400–2700)

## 2010-05-30 LAB — T-HELPER CELL (CD4) - (RCID CLINIC ONLY): CD4 T Cell Abs: 490 uL (ref 400–2700)

## 2010-06-02 LAB — T-HELPER CELL (CD4) - (RCID CLINIC ONLY): CD4 % Helper T Cell: 44 % (ref 33–55)

## 2010-06-06 LAB — T-HELPER CELL (CD4) - (RCID CLINIC ONLY)
CD4 % Helper T Cell: 43 % (ref 33–55)
CD4 % Helper T Cell: 38 % (ref 33–55)
CD4 T Cell Abs: 580 uL (ref 400–2700)

## 2010-06-13 ENCOUNTER — Other Ambulatory Visit (INDEPENDENT_AMBULATORY_CARE_PROVIDER_SITE_OTHER): Payer: 59

## 2010-06-13 DIAGNOSIS — B20 Human immunodeficiency virus [HIV] disease: Secondary | ICD-10-CM

## 2010-06-13 DIAGNOSIS — Z79899 Other long term (current) drug therapy: Secondary | ICD-10-CM

## 2010-06-13 LAB — CBC WITH DIFFERENTIAL/PLATELET
Basophils Relative: 0 % (ref 0–1)
Eosinophils Absolute: 0.1 10*3/uL (ref 0.0–0.7)
Eosinophils Relative: 2 % (ref 0–5)
Hemoglobin: 15 g/dL (ref 13.0–17.0)
Lymphs Abs: 1.7 10*3/uL (ref 0.7–4.0)
MCH: 30.3 pg (ref 26.0–34.0)
MCHC: 34.7 g/dL (ref 30.0–36.0)
MCV: 87.3 fL (ref 78.0–100.0)
Monocytes Relative: 8 % (ref 3–12)
Platelets: 210 10*3/uL (ref 150–400)
RBC: 4.95 MIL/uL (ref 4.22–5.81)

## 2010-06-14 LAB — LIPID PANEL
Total CHOL/HDL Ratio: 5 Ratio
VLDL: 65 mg/dL — ABNORMAL HIGH (ref 0–40)

## 2010-06-14 LAB — COMPLETE METABOLIC PANEL WITH GFR
Albumin: 4.5 g/dL (ref 3.5–5.2)
Alkaline Phosphatase: 71 U/L (ref 39–117)
BUN: 14 mg/dL (ref 6–23)
Calcium: 9.7 mg/dL (ref 8.4–10.5)
Chloride: 106 mEq/L (ref 96–112)
Creat: 0.96 mg/dL (ref 0.40–1.50)
GFR, Est Non African American: >60 mL/min (ref >60)
Glucose, Bld: 100 mg/dL — ABNORMAL HIGH (ref 70–99)
Potassium: 4.1 mEq/L (ref 3.5–5.3)

## 2010-06-14 LAB — T-HELPER CELL (CD4) - (RCID CLINIC ONLY)
CD4 % Helper T Cell: 41 % (ref 33–55)
CD4 T Cell Abs: 681 uL (ref 400–2700)

## 2010-06-27 ENCOUNTER — Ambulatory Visit (INDEPENDENT_AMBULATORY_CARE_PROVIDER_SITE_OTHER): Payer: 59 | Admitting: Adult Health

## 2010-06-27 ENCOUNTER — Encounter: Payer: Self-pay | Admitting: Adult Health

## 2010-06-27 VITALS — BP 162/95 | HR 76 | Temp 98.1°F | Ht 68.0 in | Wt 190.0 lb

## 2010-06-27 DIAGNOSIS — B20 Human immunodeficiency virus [HIV] disease: Secondary | ICD-10-CM

## 2010-06-27 DIAGNOSIS — E785 Hyperlipidemia, unspecified: Secondary | ICD-10-CM

## 2010-06-27 DIAGNOSIS — I10 Essential (primary) hypertension: Secondary | ICD-10-CM

## 2010-06-27 DIAGNOSIS — G4700 Insomnia, unspecified: Secondary | ICD-10-CM

## 2010-06-27 MED ORDER — LORAZEPAM 0.5 MG PO TABS: 0.5000 mg | ORAL_TABLET | Freq: Every day | ORAL | Status: DC

## 2010-06-27 NOTE — Progress Notes (Signed)
Subjective:    Patient ID: Brandon Joyce, male    DOB: Dec 16, 1950, 60 y.o.   MRN: [id number]  HPI presents to clinic for followup. Claims adherence to his medications with good tolerance and no missed doses. He voices no new physical complaints except for noticeable weight gain. He also relates increased stress at work, which he feels has affected his blood pressure.    Review of Systems  Constitutional: Negative for fever, chills, diaphoresis, activity change, appetite change, fatigue and unexpected weight change.  HENT: Negative for hearing loss, ear pain, nosebleeds, congestion, sore throat, facial swelling, rhinorrhea, sneezing, drooling, mouth sores, trouble swallowing, neck pain, neck stiffness, dental problem, voice change, postnasal drip, sinus pressure, tinnitus and ear discharge.   Eyes: Negative for photophobia, pain, discharge, redness, itching and visual disturbance.  Respiratory: Negative for apnea, cough, choking, chest tightness, shortness of breath, wheezing and stridor.   Cardiovascular: Negative for chest pain, palpitations and leg swelling.  Gastrointestinal: Negative for nausea, vomiting, abdominal pain, diarrhea, constipation, blood in stool, abdominal distention, anal bleeding and rectal pain.  Genitourinary: Negative for dysuria, urgency, frequency, hematuria, flank pain, decreased urine volume, discharge, penile swelling, scrotal swelling, enuresis, difficulty urinating, genital sores, penile pain and testicular pain.  Musculoskeletal: Negative for myalgias, back pain, joint swelling, arthralgias and gait problem.  Skin: Negative for color change, pallor, rash and wound.  Neurological: Negative for dizziness, tremors, seizures, syncope, facial asymmetry, speech difficulty, weakness, light-headedness, numbness and headaches.  Hematological: Negative for adenopathy. Does not bruise/bleed easily.  Psychiatric/Behavioral: Negative for suicidal ideas, hallucinations, behavioral  problems, confusion, sleep disturbance, self-injury, dysphoric mood, decreased concentration and agitation. The patient is not nervous/anxious and is not hyperactive.        Objective:   Physical Exam  Constitutional: He is oriented to person, place, and time. He appears well-developed and well-nourished. No distress.  HENT:  Head: Normocephalic and atraumatic.  Right Ear: External ear normal.  Left Ear: External ear normal.  Mouth/Throat: Oropharynx is clear and moist. No oropharyngeal exudate.  Eyes: Conjunctivae and EOM are normal. Pupils are equal, round, and reactive to light. Right eye exhibits no discharge. Left eye exhibits no discharge.  Neck: Normal range of motion. Neck supple. No JVD present. No thyromegaly present.  Cardiovascular: Normal rate, regular rhythm, normal heart sounds and intact distal pulses.   Pulmonary/Chest: Effort normal and breath sounds normal.  Abdominal: Soft. Bowel sounds are normal.  Musculoskeletal: Normal range of motion. He exhibits no edema and no tenderness.  Neurological: He is alert and oriented to person, place, and time. No cranial nerve deficit. Coordination normal.  Skin: Skin is warm. No rash noted. He is not diaphoretic.  Psychiatric: He has a normal mood and affect. Judgment and thought content normal.          Assessment & Plan:  1. HIV. CD4 count 681 at 41% with a viral load of 40 copies per mL. He has a mild "blip" in his viral load, but nothing a value. Otherwise, clinically stable on his current regimen. Recommend continuing present management with a followup in 4 months and fasting labs 2 weeks before his appointment. Verbally acknowledged this and agreed with plan.  2. Dyslipidemia. Total cholesterol, was 185 mg/dL, his HDL was low at 37, LDL was relatively normal at 83, but his serum triglycerides were 327. Triglycerides continue a trend upward from previous evaluation of 276. He did admit that he did not fast prior to his lab  work, however, he also admits  his diet is "not the best." We spent great length of time discussing measures to improve his lipids. Specifically, his triglycerides. Advise on a carbohydrate restricted diet and low fat diet with a graded exercise program as well. Also discussed the use of fish oil capsules, which he agreed to (enteric-coated fish oil caps 1000 mg), and he was instructed to take 2 capsules twice a day with meals. He verbally knowledge. All information that was discussed with him and agreed with plan of care. We'll reevaluate his lipids on his next blood draw.  3. Hypertension. He claims his blood pressure is elevated relative to his work issues. However, he states he has been having some problems with blood pressure elevations recently. He does have a blood pressure monitor at home and we recommended he check his blood pressure twice a week with the explicit instructions that should his systolic blood pressure remained greater than 140 mm Hg he should contact the clinic so that we can make some adjustments to his blood pressure medications. He agreed with this and stated he would call us if there are problems with his blood pressure. We will continue to followup with him on next visit.  4. Insomnia. Chronic in nature. Good response was 0.5 mg, lorazepam therapy. We will go ahead and renew this for him.

## 2010-07-23 ENCOUNTER — Emergency Department (HOSPITAL_COMMUNITY)
Admission: EM | Admit: 2010-07-23 | Discharge: 2010-07-23 | Disposition: A | Discharge status: Home / Self Care | Payer: Worker's Compensation | Attending: Emergency Medicine | Admitting: Emergency Medicine

## 2010-07-23 DIAGNOSIS — F411 Generalized anxiety disorder: Secondary | ICD-10-CM | POA: Insufficient documentation

## 2010-07-23 DIAGNOSIS — S52539A Colles' fracture of unspecified radius, initial encounter for closed fracture: Secondary | ICD-10-CM | POA: Insufficient documentation

## 2010-07-23 DIAGNOSIS — I10 Essential (primary) hypertension: Secondary | ICD-10-CM | POA: Insufficient documentation

## 2010-07-23 DIAGNOSIS — W208XXA Other cause of strike by thrown, projected or falling object, initial encounter: Secondary | ICD-10-CM | POA: Insufficient documentation

## 2010-07-23 DIAGNOSIS — S51809A Unspecified open wound of unspecified forearm, initial encounter: Secondary | ICD-10-CM | POA: Insufficient documentation

## 2010-10-30 ENCOUNTER — Other Ambulatory Visit: Payer: Self-pay | Admitting: *Deleted

## 2010-10-30 ENCOUNTER — Other Ambulatory Visit (INDEPENDENT_AMBULATORY_CARE_PROVIDER_SITE_OTHER): Payer: 59

## 2010-10-30 ENCOUNTER — Other Ambulatory Visit: Payer: Self-pay | Admitting: Infectious Diseases

## 2010-10-30 DIAGNOSIS — E785 Hyperlipidemia, unspecified: Secondary | ICD-10-CM

## 2010-10-30 DIAGNOSIS — I10 Essential (primary) hypertension: Secondary | ICD-10-CM

## 2010-10-30 DIAGNOSIS — B20 Human immunodeficiency virus [HIV] disease: Secondary | ICD-10-CM

## 2010-10-30 MED ORDER — ATENOLOL 100 MG PO TABS: 100.0000 mg | ORAL_TABLET | Freq: Every day | ORAL | Status: DC

## 2010-10-31 LAB — COMPREHENSIVE METABOLIC PANEL
Albumin: 4.9 g/dL (ref 3.5–5.2)
BUN: 16 mg/dL (ref 6–23)
CO2: 22 mEq/L (ref 19–32)
Calcium: 10.1 mg/dL (ref 8.4–10.5)
Chloride: 107 mEq/L (ref 96–112)
Glucose, Bld: 90 mg/dL (ref 70–99)
Potassium: 4.5 mEq/L (ref 3.5–5.3)

## 2010-10-31 LAB — CBC WITH DIFFERENTIAL/PLATELET
Basophils Relative: 0 % (ref 0–1)
HCT: 45.8 % (ref 39.0–52.0)
Hemoglobin: 15.7 g/dL (ref 13.0–17.0)
Lymphocytes Relative: 32 % (ref 12–46)
Lymphs Abs: 1.6 10*3/uL (ref 0.7–4.0)
MCHC: 34.3 g/dL (ref 30.0–36.0)
Monocytes Absolute: 0.5 10*3/uL (ref 0.1–1.0)
Monocytes Relative: 10 % (ref 3–12)
Neutro Abs: 2.7 10*3/uL (ref 1.7–7.7)

## 2010-10-31 LAB — LIPID PANEL: Cholesterol: 182 mg/dL (ref 0–200)

## 2010-10-31 LAB — T-HELPER CELL (CD4) - (RCID CLINIC ONLY)
CD4 % Helper T Cell: 44 % (ref 33–55)
CD4 T Cell Abs: 700 uL (ref 400–2700)

## 2010-11-01 LAB — HIV-1 RNA QUANT-NO REFLEX-BLD: HIV-1 RNA Quant, Log: <1.3 {Log} (ref <1.30)

## 2010-11-13 ENCOUNTER — Encounter: Payer: Self-pay | Admitting: Adult Health

## 2010-11-13 ENCOUNTER — Ambulatory Visit (INDEPENDENT_AMBULATORY_CARE_PROVIDER_SITE_OTHER): Payer: 59 | Admitting: Adult Health

## 2010-11-13 VITALS — BP 164/88 | HR 59 | Temp 97.9°F | Ht 67.0 in | Wt 187.0 lb

## 2010-11-13 DIAGNOSIS — B20 Human immunodeficiency virus [HIV] disease: Secondary | ICD-10-CM

## 2010-11-13 DIAGNOSIS — I10 Essential (primary) hypertension: Secondary | ICD-10-CM

## 2010-11-13 DIAGNOSIS — Z23 Encounter for immunization: Secondary | ICD-10-CM

## 2010-11-13 MED ORDER — LISINOPRIL 20 MG PO TABS: 20.0000 mg | ORAL_TABLET | Freq: Every day | ORAL | Status: DC

## 2010-11-18 ENCOUNTER — Other Ambulatory Visit: Payer: Self-pay | Admitting: *Deleted

## 2010-11-18 DIAGNOSIS — B20 Human immunodeficiency virus [HIV] disease: Secondary | ICD-10-CM

## 2010-11-18 MED ORDER — EFAVIRENZ-EMTRICITAB-TENOFOVIR 600-200-300 MG PO TABS: 1.0000 | ORAL_TABLET | Freq: Every day | ORAL | Status: DC

## 2010-11-25 ENCOUNTER — Ambulatory Visit (INDEPENDENT_AMBULATORY_CARE_PROVIDER_SITE_OTHER): Payer: 59 | Admitting: Adult Health

## 2010-11-25 VITALS — BP 134/83 | HR 46 | Ht 69.0 in | Wt 186.8 lb

## 2010-11-25 DIAGNOSIS — I10 Essential (primary) hypertension: Secondary | ICD-10-CM

## 2010-11-29 LAB — T-HELPER CELL (CD4) - (RCID CLINIC ONLY): CD4 % Helper T Cell: 45 % (ref 33–55)

## 2010-12-04 LAB — T-HELPER CELL (CD4) - (RCID CLINIC ONLY)
CD4 % Helper T Cell: 33
CD4 T Cell Abs: 360 — ABNORMAL LOW

## 2010-12-12 LAB — T-HELPER CELL (CD4) - (RCID CLINIC ONLY)
CD4 % Helper T Cell: 28 — ABNORMAL LOW
CD4 T Cell Abs: 430

## 2011-03-03 ENCOUNTER — Other Ambulatory Visit: Payer: Self-pay | Admitting: Infectious Diseases

## 2011-03-03 DIAGNOSIS — Z79899 Other long term (current) drug therapy: Secondary | ICD-10-CM

## 2011-03-03 DIAGNOSIS — Z113 Encounter for screening for infections with a predominantly sexual mode of transmission: Secondary | ICD-10-CM

## 2011-03-03 DIAGNOSIS — Z21 Asymptomatic human immunodeficiency virus [HIV] infection status: Secondary | ICD-10-CM

## 2011-03-03 DIAGNOSIS — B20 Human immunodeficiency virus [HIV] disease: Secondary | ICD-10-CM

## 2011-03-04 ENCOUNTER — Other Ambulatory Visit: Payer: Self-pay | Admitting: Infectious Disease

## 2011-03-04 ENCOUNTER — Other Ambulatory Visit (INDEPENDENT_AMBULATORY_CARE_PROVIDER_SITE_OTHER): Payer: 59

## 2011-03-04 DIAGNOSIS — B20 Human immunodeficiency virus [HIV] disease: Secondary | ICD-10-CM

## 2011-03-04 DIAGNOSIS — Z113 Encounter for screening for infections with a predominantly sexual mode of transmission: Secondary | ICD-10-CM

## 2011-03-04 DIAGNOSIS — Z79899 Other long term (current) drug therapy: Secondary | ICD-10-CM

## 2011-03-05 LAB — CBC WITH DIFFERENTIAL/PLATELET
Basophils Absolute: 0 10*3/uL (ref 0.0–0.1)
Basophils Relative: 1 % (ref 0–1)
Eosinophils Absolute: 0.2 10*3/uL (ref 0.0–0.7)
Eosinophils Relative: 3 % (ref 0–5)
MCH: 30.9 pg (ref 26.0–34.0)
MCV: 87.9 fL (ref 78.0–100.0)
Platelets: 182 10*3/uL (ref 150–400)
RDW: 14.8 % (ref 11.5–15.5)
WBC: 5 10*3/uL (ref 4.0–10.5)

## 2011-03-05 LAB — COMPREHENSIVE METABOLIC PANEL
ALT: 54 U/L — ABNORMAL HIGH (ref 0–53)
AST: 34 U/L (ref 0–37)
Creat: 1 mg/dL (ref 0.50–1.35)
Total Bilirubin: 0.5 mg/dL (ref 0.3–1.2)

## 2011-03-05 LAB — LIPID PANEL
HDL: 36 mg/dL — ABNORMAL LOW (ref >39)
LDL Cholesterol: 131 mg/dL — ABNORMAL HIGH (ref 0–99)
Triglycerides: 328 mg/dL — ABNORMAL HIGH (ref <150)
VLDL: 66 mg/dL — ABNORMAL HIGH (ref 0–40)

## 2011-03-05 LAB — T-HELPER CELL (CD4) - (RCID CLINIC ONLY)
CD4 % Helper T Cell: 44 % (ref 33–55)
CD4 T Cell Abs: 740 uL (ref 400–2700)

## 2011-03-06 LAB — HIV-1 RNA QUANT-NO REFLEX-BLD: HIV 1 RNA Quant: <20 copies/mL (ref <20)

## 2011-03-19 ENCOUNTER — Ambulatory Visit (INDEPENDENT_AMBULATORY_CARE_PROVIDER_SITE_OTHER): Payer: 59 | Admitting: Infectious Diseases

## 2011-03-19 ENCOUNTER — Other Ambulatory Visit: Payer: Self-pay | Admitting: *Deleted

## 2011-03-19 ENCOUNTER — Encounter: Payer: Self-pay | Admitting: Infectious Diseases

## 2011-03-19 VITALS — BP 163/94 | HR 62 | Temp 98.2°F | Ht 68.0 in | Wt 195.8 lb

## 2011-03-19 DIAGNOSIS — E785 Hyperlipidemia, unspecified: Secondary | ICD-10-CM

## 2011-03-19 DIAGNOSIS — I10 Essential (primary) hypertension: Secondary | ICD-10-CM

## 2011-03-19 DIAGNOSIS — G4700 Insomnia, unspecified: Secondary | ICD-10-CM

## 2011-03-19 DIAGNOSIS — Z0000 Encounter for general adult medical examination without abnormal findings: Secondary | ICD-10-CM

## 2011-03-19 DIAGNOSIS — B20 Human immunodeficiency virus [HIV] disease: Secondary | ICD-10-CM

## 2011-03-19 DIAGNOSIS — F528 Other sexual dysfunction not due to a substance or known physiological condition: Secondary | ICD-10-CM

## 2011-03-19 DIAGNOSIS — Z23 Encounter for immunization: Secondary | ICD-10-CM

## 2011-03-19 MED ORDER — LORAZEPAM 0.5 MG PO TABS
0.5000 mg | ORAL_TABLET | Freq: Every day | ORAL | Status: DC
Start: 2011-03-19 — End: 2011-03-19

## 2011-03-19 MED ORDER — LISINOPRIL 20 MG PO TABS: 20.0000 mg | ORAL_TABLET | Freq: Every day | ORAL | Status: DC

## 2011-03-19 MED ORDER — LORAZEPAM 0.5 MG PO TABS
0.5000 mg | ORAL_TABLET | Freq: Every day | ORAL | Status: DC
Start: 2011-03-19 — End: 2011-08-26

## 2011-03-19 NOTE — Progress Notes (Signed)
  Subjective:    Patient ID: Brandon Joyce, male    DOB: 22-Sep-1950, 61 y.o.   MRN: [id number]  HPI 61 yo M with HIV, HTN, hyperlipidemia. Last CD4 740, VL <20, Chol 233, Trig 328 (03-04-11). Was pre started on lipitor.  Feels well today. Had a couple of bad nights- panic/anxiety. Has been out of lorazepam.    Review of Systems  Constitutional: Negative for appetite change and unexpected weight change.  Cardiovascular: Negative for chest pain.  Gastrointestinal: Negative for diarrhea and constipation.  Genitourinary: Negative for dysuria.  Neurological: Positive for headaches.  Psychiatric/Behavioral: Positive for sleep disturbance. The patient is nervous/anxious.        Objective:   Physical Exam  Constitutional: He appears well-developed and well-nourished.  HENT:  Head: Normocephalic.  Mouth/Throat: No oropharyngeal exudate.  Eyes: EOM are normal. Pupils are equal, round, and reactive to light.  Neck: Neck supple.  Cardiovascular: Normal rate, regular rhythm and normal heart sounds.   Pulmonary/Chest: Effort normal and breath sounds normal.  Abdominal: Soft. Bowel sounds are normal. He exhibits no distension. There is no tenderness.  Lymphadenopathy:    He has no cervical adenopathy.  Psychiatric: He has a normal mood and affect.          Assessment & Plan:

## 2011-03-19 NOTE — Assessment & Plan Note (Signed)
Will add ACE-I to his beta blocker. See him back in 3-4 months.

## 2011-03-19 NOTE — Assessment & Plan Note (Signed)
Offered viagra but he refused. Given condoms.

## 2011-03-19 NOTE — Assessment & Plan Note (Signed)
States he has been off his meds for "a couple of weeks". Will refill and get labs when he is adherent.

## 2011-03-19 NOTE — Assessment & Plan Note (Signed)
Will refill his lorazepam.

## 2011-03-19 NOTE — Assessment & Plan Note (Signed)
He is doing very well. Given condoms. vax are up to date. Will get him an IM referral. See him back in 3-4 months.

## 2011-03-20 ENCOUNTER — Telehealth: Payer: Self-pay | Admitting: *Deleted

## 2011-03-20 NOTE — Telephone Encounter (Signed)
His wife called to make sure he was to take both bp meds. I told her yes

## 2011-03-25 NOTE — Assessment & Plan Note (Signed)
Clinically stable on current regimen with good virologic and immunologic control. Continue present management.

## 2011-03-25 NOTE — Progress Notes (Signed)
Patient ID: Brandon Joyce, male   DOB: March 23, 1950, 61 y.o.   MRN: [id number] Subjective:    Patient ID: Brandon Joyce is a 61 y.o. male.  Chief Complaint: HIV Follow-up Visit Brandon Joyce is here for follow-up of HIV infection. He is feeling unchanged since his last visit.  He claims continued adherence to therapy with good tolerance and no complications. There are not additional complaints.   Data Review: Diagnostic studies reviewed.  Review of Systems - General ROS: negative for - fatigue, malaise or sleep disturbance Psychological ROS: negative for - anxiety, behavioral disorder, concentration difficulties, depression or mood swings Ophthalmic ROS: negative for - blurry vision, decreased vision or double vision Respiratory ROS: no cough, shortness of breath, or wheezing Cardiovascular ROS: no chest pain or dyspnea on exertion Gastrointestinal ROS: no abdominal pain, change in bowel habits, or black or bloody stools Neurological ROS: no TIA or stroke symptoms  Objective:  General appearance: alert, cooperative, appears stated age and no distress Head: Normocephalic, without obvious abnormality, atraumatic Neck: no adenopathy, no carotid bruit, no JVD, supple, symmetrical, trachea midline and thyroid not enlarged, symmetric, no tenderness/mass/nodules Resp: clear to auscultation bilaterally Cardio: regular rate and rhythm, S1, S2 normal, no murmur, click, rub or gallop Extremities: extremities normal, atraumatic, no cyanosis or edema Neurologic: Alert and oriented X 3, normal strength and tone. Normal symmetric reflexes. Normal coordination and gait Skin:  No active lesions or rashes noted.    Psych:  No vegetative signs or delusional behaviors noted.    Laboratory: From 10/30/2010 ,  CD4 count was 700 c/cmm @ 44 %. Viral load was less than 20 copies/mL.     Assessment/Plan:   HIV DISEASE Clinically stable on current regimen with good virologic and immunologic control. Continue  present management.  Counseling provided on prevention of transmission of HIV. Medication adherence discussed with patient. Follow up visit in 4 months with labs 2 weeks prior to appointment.     HYPERTENSION, BENIGN Despite the use of Tenormin 100 mg daily, there still seems to be significant. 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 We will add lisinopril 20 mg by mouth daily, and asked that he return in one to 2 weeks for repeat blood pressure check. Followup with a provider will be necessary. If blood pressure control is not maintained.     Brandon Joyce A. Sundra Aland, MS, [facility] [phone]  03/25/2011, 10:22 AM

## 2011-03-25 NOTE — Assessment & Plan Note (Signed)
Despite the use of Tenormin 100 mg daily, there still seems to be significant. 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 We will add lisinopril 20 mg by mouth daily, and asked that he return in one to 2 weeks for repeat blood pressure check. Followup with a provider will be necessary. If blood pressure control is not maintained.

## 2011-04-03 ENCOUNTER — Telehealth: Payer: Self-pay | Admitting: *Deleted

## 2011-04-03 NOTE — Telephone Encounter (Signed)
rec'd email from Brandon Joyce at internal medicine that he will be seen 04/22/11 at 3:15. They will be his primary from now on. I called his cell which remained busy. I called his home # & lm asking him to call me so I can give him this info

## 2011-04-04 ENCOUNTER — Encounter: Payer: Self-pay | Admitting: *Deleted

## 2011-04-04 NOTE — Telephone Encounter (Signed)
His cell remains busy. If he has not called back today, I will send him a letter

## 2011-04-18 ENCOUNTER — Telehealth: Payer: Self-pay | Admitting: *Deleted

## 2011-04-18 ENCOUNTER — Other Ambulatory Visit: Payer: Self-pay | Admitting: *Deleted

## 2011-04-18 DIAGNOSIS — E785 Hyperlipidemia, unspecified: Secondary | ICD-10-CM

## 2011-04-18 MED ORDER — ATORVASTATIN CALCIUM 20 MG PO TABS: 20.0000 mg | ORAL_TABLET | Freq: Every day | ORAL | Status: DC

## 2011-04-18 NOTE — Telephone Encounter (Signed)
Patient wife called and advised he needs his Rx for lipitor filled. Advised will send Rx to pharmacy on file.

## 2011-04-22 ENCOUNTER — Ambulatory Visit (INDEPENDENT_AMBULATORY_CARE_PROVIDER_SITE_OTHER): Payer: 59 | Admitting: Internal Medicine

## 2011-04-22 ENCOUNTER — Encounter: Payer: Self-pay | Admitting: Internal Medicine

## 2011-04-22 VITALS — BP 130/76 | HR 54 | Temp 96.8°F | Ht 68.0 in | Wt 192.6 lb

## 2011-04-22 DIAGNOSIS — N529 Male erectile dysfunction, unspecified: Secondary | ICD-10-CM

## 2011-04-22 DIAGNOSIS — E785 Hyperlipidemia, unspecified: Secondary | ICD-10-CM

## 2011-04-22 DIAGNOSIS — I10 Essential (primary) hypertension: Secondary | ICD-10-CM

## 2011-04-22 DIAGNOSIS — F1020 Alcohol dependence, uncomplicated: Secondary | ICD-10-CM

## 2011-04-22 DIAGNOSIS — F528 Other sexual dysfunction not due to a substance or known physiological condition: Secondary | ICD-10-CM

## 2011-04-22 NOTE — Assessment & Plan Note (Signed)
?  secondary to BB, offered him the option of changing to hctz-ACei combo but wants to finish the pills he is left with and will call us after that

## 2011-04-22 NOTE — Patient Instructions (Signed)
Your bad cholesterol number (LDL) is 131.  For you we want the number to be less than 160

## 2011-04-22 NOTE — Assessment & Plan Note (Signed)
LDL Goal <160 controlled on lipitor LFTs normal last month No myalgia

## 2011-04-22 NOTE — Assessment & Plan Note (Addendum)
Drinks 3 beer/day and 12 pack on weekend. 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 No specific stressors or reason for drinking. Says it seems like the right thing to do after a long day at work or if the football match is going on .  Was drinking much more but cut down last year and has not thinking about cutting down anymore. No guilt about it. Not annoyed to discuss it. No withdrawal symptoms in am.  Had extensive discussion about quitting and outlines a plan for him LFTs normal last month Will call the clinic if needs further help for quitting

## 2011-04-22 NOTE — Progress Notes (Signed)
Patient ID: Brandon Joyce, male   DOB: [birth year], 61 y.o.   MRN: [id number]  61 y/o m with pmh of HIV, HTN, HLD comes to establish care. Never seen medicine clinic here. Referred from [facility] No complaints Compliant with medications  Physical exam   General Appearance:     Filed Vitals:   04/22/11 1504  BP: 130/76  Pulse: 54  Temp: 96.8 F (36 C)  TempSrc: Oral  Height: 5\' 8"  (1.727 m)  Weight: 192 lb 9.6 oz (87.363 kg)  SpO2: 98%     Alert, cooperative, no distress, appears stated age  Head:    Normocephalic, without obvious abnormality, atraumatic  Eyes:    PERRL, conjunctiva/corneas clear, EOM's intact, fundi    benign, both eyes       Neck:   Supple, symmetrical, trachea midline, no adenopathy;       thyroid:  No enlargement/tenderness/nodules; no carotid   bruit or JVD  Lungs:     Clear to auscultation bilaterally, respirations unlabored  Chest wall:    No tenderness or deformity  Heart:    Regular rate and rhythm, S1 and S2 normal, no murmur, rub   or gallop  Abdomen:     Soft, non-tender, bowel sounds active all four quadrants,    no masses, no organomegaly  Extremities:   Extremities normal, atraumatic, no cyanosis or edema  Pulses:   2+ and symmetric all extremities  Skin:   Skin color, texture, turgor normal, no rashes or lesions  Neurologic:  nonfocal grossly    ROS  Constitutional: Denies fever, chills, diaphoresis, appetite change and fatigue.  Respiratory: Denies SOB, DOE, cough, chest tightness,  and wheezing.   Cardiovascular: Denies chest pain, palpitations and leg swelling.  Gastrointestinal: Denies nausea, vomiting, abdominal pain, diarrhea, constipation, blood in stool and abdominal distention.  Skin: Denies pallor, rash and wound.  Neurological: Denies dizziness, light-headedness, numbness and headaches.

## 2011-04-22 NOTE — Assessment & Plan Note (Signed)
Has been on BB for a long time, ACei was added last month at [facility] I believe with no synergistic effect of ACE+BB and c/o impotence, ACE-HCTZ combo will be better for him He does not want to mess around much with his meds today but will re-emphasize at next visit. Also he will call us if his ED/impotence becomes unbearable and we can change his meds over the phone at that time Chem checked last month and was normal

## 2011-05-28 ENCOUNTER — Other Ambulatory Visit: Payer: Self-pay | Admitting: *Deleted

## 2011-05-28 DIAGNOSIS — Z113 Encounter for screening for infections with a predominantly sexual mode of transmission: Secondary | ICD-10-CM

## 2011-06-24 ENCOUNTER — Other Ambulatory Visit: Payer: Self-pay | Admitting: Licensed Clinical Social Worker

## 2011-06-24 DIAGNOSIS — B20 Human immunodeficiency virus [HIV] disease: Secondary | ICD-10-CM

## 2011-06-24 MED ORDER — EFAVIRENZ-EMTRICITAB-TENOFOVIR 600-200-300 MG PO TABS: 1.0000 | ORAL_TABLET | Freq: Every day | ORAL | Status: DC

## 2011-07-17 ENCOUNTER — Other Ambulatory Visit: Payer: Self-pay | Admitting: *Deleted

## 2011-07-17 ENCOUNTER — Other Ambulatory Visit: Payer: 59

## 2011-07-17 DIAGNOSIS — Z113 Encounter for screening for infections with a predominantly sexual mode of transmission: Secondary | ICD-10-CM

## 2011-07-17 DIAGNOSIS — B20 Human immunodeficiency virus [HIV] disease: Secondary | ICD-10-CM

## 2011-07-17 LAB — CBC
MCV: 86.5 fL (ref 78.0–100.0)
Platelets: 219 10*3/uL (ref 150–400)
RDW: 14.4 % (ref 11.5–15.5)
WBC: 5.2 10*3/uL (ref 4.0–10.5)

## 2011-07-17 LAB — COMPLETE METABOLIC PANEL WITH GFR
ALT: 29 U/L (ref 0–53)
AST: 19 U/L (ref 0–37)
Calcium: 9.2 mg/dL (ref 8.4–10.5)
Chloride: 108 mEq/L (ref 96–112)
Creat: 1.14 mg/dL (ref 0.50–1.35)

## 2011-07-18 LAB — T-HELPER CELL (CD4) - (RCID CLINIC ONLY): CD4 % Helper T Cell: 42 % (ref 33–55)

## 2011-07-18 LAB — HIV-1 RNA QUANT-NO REFLEX-BLD
HIV 1 RNA Quant: 36 {copies}/mL — ABNORMAL HIGH
HIV-1 RNA Quant, Log: 1.56 {Log} — ABNORMAL HIGH

## 2011-08-04 ENCOUNTER — Ambulatory Visit (INDEPENDENT_AMBULATORY_CARE_PROVIDER_SITE_OTHER): Payer: 59 | Admitting: Infectious Diseases

## 2011-08-04 ENCOUNTER — Encounter: Payer: Self-pay | Admitting: Infectious Diseases

## 2011-08-04 VITALS — BP 136/82 | HR 60 | Temp 97.9°F | Ht 68.0 in | Wt 190.0 lb

## 2011-08-04 DIAGNOSIS — E785 Hyperlipidemia, unspecified: Secondary | ICD-10-CM

## 2011-08-04 DIAGNOSIS — R252 Cramp and spasm: Secondary | ICD-10-CM

## 2011-08-04 DIAGNOSIS — B20 Human immunodeficiency virus [HIV] disease: Secondary | ICD-10-CM

## 2011-08-04 NOTE — Progress Notes (Signed)
  Subjective:    Patient ID: Brandon Joyce, male    DOB: February 01, 1951, 61 y.o.   MRN: [id number]  HPI 61 yo M with HIV, HTN, hyperlipidemia. Was previously started on lipitor.  Also hx of panic/anxiety.  Working very hard (only 3 saturdays off 3-4 months). Taking atripla without problems.  Has been having leg cramps, woke him from sleep. Taking lipitor (since beginning of year).   HIV 1 RNA Quant (copies/mL)  Date Value  07/17/2011 36*  03/04/2011 <20   10/30/2010 <20      CD4 T Cell Abs (cmm)  Date Value  07/17/2011 700   03/04/2011 740   10/30/2010 700       Review of Systems  Constitutional: Negative for appetite change and unexpected weight change.  Cardiovascular: Negative for chest pain, palpitations and leg swelling.  Gastrointestinal: Negative for diarrhea.  Genitourinary: Negative for dysuria.       Objective:   Physical Exam  Constitutional: He appears well-developed and well-nourished.  HENT:  Mouth/Throat: No oropharyngeal exudate.  Eyes: EOM are normal. Pupils are equal, round, and reactive to light.  Neck: Neck supple.  Cardiovascular: Normal rate, regular rhythm and normal heart sounds.   Pulmonary/Chest: Effort normal and breath sounds normal.  Abdominal: Soft. Bowel sounds are normal. He exhibits no distension. There is no tenderness.  Musculoskeletal: He exhibits no tenderness.  Lymphadenopathy:    He has no cervical adenopathy.          Assessment & Plan:

## 2011-08-04 NOTE — Assessment & Plan Note (Signed)
He is doing well, no change in his medications. Will cont to watch. Offered/refused condoms. rtc  3 months to f/u his cramps. vax up to date.

## 2011-08-04 NOTE — Assessment & Plan Note (Signed)
Suspect this may be related to his statin. Could also be electrolyte abn (although nl recently), work, thyroid? HIV? Will stop statin and see if this improves, if not check bmp, tsh, ck. Muscle bx?

## 2011-08-04 NOTE — Assessment & Plan Note (Signed)
Stop lipitor due to leg cramps. Diet and exercise alone?

## 2011-08-26 ENCOUNTER — Other Ambulatory Visit: Payer: Self-pay | Admitting: Infectious Diseases

## 2011-08-30 ENCOUNTER — Other Ambulatory Visit: Payer: Self-pay | Admitting: Infectious Diseases

## 2011-09-01 ENCOUNTER — Other Ambulatory Visit: Payer: Self-pay | Admitting: Licensed Clinical Social Worker

## 2011-09-01 DIAGNOSIS — F419 Anxiety disorder, unspecified: Secondary | ICD-10-CM

## 2011-09-01 MED ORDER — LORAZEPAM 0.5 MG PO TABS: 0.5000 mg | ORAL_TABLET | Freq: Every day | ORAL | Status: DC

## 2011-10-22 ENCOUNTER — Other Ambulatory Visit: Payer: Self-pay | Admitting: Infectious Diseases

## 2011-10-22 DIAGNOSIS — B20 Human immunodeficiency virus [HIV] disease: Secondary | ICD-10-CM

## 2011-10-29 ENCOUNTER — Other Ambulatory Visit: Payer: 59

## 2011-10-29 DIAGNOSIS — B20 Human immunodeficiency virus [HIV] disease: Secondary | ICD-10-CM

## 2011-10-29 LAB — CBC WITH DIFFERENTIAL/PLATELET
Eosinophils Relative: 4 % (ref 0–5)
HCT: 44.2 % (ref 39.0–52.0)
Hemoglobin: 15.4 g/dL (ref 13.0–17.0)
Lymphocytes Relative: 33 % (ref 12–46)
Lymphs Abs: 1.6 10*3/uL (ref 0.7–4.0)
MCV: 87.9 fL (ref 78.0–100.0)
Monocytes Absolute: 0.4 10*3/uL (ref 0.1–1.0)
Monocytes Relative: 8 % (ref 3–12)
RBC: 5.03 MIL/uL (ref 4.22–5.81)
RDW: 14.6 % (ref 11.5–15.5)
WBC: 4.9 10*3/uL (ref 4.0–10.5)

## 2011-10-30 LAB — COMPREHENSIVE METABOLIC PANEL
Albumin: 4.6 g/dL (ref 3.5–5.2)
BUN: 13 mg/dL (ref 6–23)
CO2: 24 mEq/L (ref 19–32)
Calcium: 9.6 mg/dL (ref 8.4–10.5)
Chloride: 107 mEq/L (ref 96–112)
Creat: 1.11 mg/dL (ref 0.50–1.35)
Glucose, Bld: 93 mg/dL (ref 70–99)

## 2011-10-31 LAB — T-HELPER CELL (CD4) - (RCID CLINIC ONLY): CD4 T Cell Abs: 710 uL (ref 400–2700)

## 2011-10-31 LAB — HIV-1 RNA QUANT-NO REFLEX-BLD: HIV 1 RNA Quant: <20 copies/mL (ref <20)

## 2011-11-12 ENCOUNTER — Ambulatory Visit: Payer: Self-pay | Admitting: Infectious Diseases

## 2011-12-03 ENCOUNTER — Other Ambulatory Visit: Payer: Self-pay | Admitting: *Deleted

## 2011-12-03 ENCOUNTER — Ambulatory Visit: Payer: Self-pay | Admitting: Infectious Diseases

## 2011-12-03 ENCOUNTER — Encounter: Payer: Self-pay | Admitting: Infectious Diseases

## 2011-12-03 ENCOUNTER — Ambulatory Visit (INDEPENDENT_AMBULATORY_CARE_PROVIDER_SITE_OTHER): Payer: 59 | Admitting: Infectious Diseases

## 2011-12-03 VITALS — BP 119/70 | HR 58 | Temp 97.8°F | Ht 68.0 in | Wt 186.0 lb

## 2011-12-03 DIAGNOSIS — Z79899 Other long term (current) drug therapy: Secondary | ICD-10-CM

## 2011-12-03 DIAGNOSIS — Z113 Encounter for screening for infections with a predominantly sexual mode of transmission: Secondary | ICD-10-CM

## 2011-12-03 DIAGNOSIS — I10 Essential (primary) hypertension: Secondary | ICD-10-CM

## 2011-12-03 DIAGNOSIS — B20 Human immunodeficiency virus [HIV] disease: Secondary | ICD-10-CM

## 2011-12-03 DIAGNOSIS — F528 Other sexual dysfunction not due to a substance or known physiological condition: Secondary | ICD-10-CM

## 2011-12-03 MED ORDER — TADALAFIL 5 MG PO TABS: 5.0000 mg | ORAL_TABLET | Freq: Every day | ORAL | Status: DC | PRN

## 2011-12-03 MED ORDER — ATENOLOL 100 MG PO TABS: 100.0000 mg | ORAL_TABLET | Freq: Every day | ORAL | Status: DC

## 2011-12-03 NOTE — Assessment & Plan Note (Signed)
He's doing well. Offered/has condoms. Will get flu shot at work. Will see him back in 6 months.

## 2011-12-03 NOTE — Progress Notes (Signed)
  Subjective:    Patient ID: Brandon Joyce, male    DOB: 06-28-1950, 61 y.o.   MRN: [id number]  HPI 61 yo M with hx of HIV+, hyperlipidemia. At prev visit was having difficulty with muscle pain/cramps. His lipitor was stopped, cramps went away. Then he restarted his lipitor and has not had problems since. He has developed pain in his L heel. Worse when he is at home.  BP medicine has been messing with his "performance". Has not tried any viagra/cialis for several years. Had previously been using testosterone gel. Not sure that it helped. Has been feeling more fatigued lately.  Atripla going well.   HIV 1 RNA Quant (copies/mL)  Date Value  10/29/2011 <20   07/17/2011 36*  03/04/2011 <20      CD4 T Cell Abs (cmm)  Date Value  10/29/2011 710   07/17/2011 700   03/04/2011 740       Review of Systems  Constitutional: Negative for appetite change and unexpected weight change.  Gastrointestinal: Negative for diarrhea and constipation.  Genitourinary: Negative for dysuria.       Objective:   Physical Exam  Constitutional: He appears well-developed and well-nourished.  Eyes: EOM are normal. Pupils are equal, round, and reactive to light.  Neck: Neck supple.  Cardiovascular: Normal rate, regular rhythm and normal heart sounds.   Pulmonary/Chest: Effort normal and breath sounds normal.  Abdominal: Soft. Bowel sounds are normal. He exhibits no distension. There is no tenderness.  Lymphadenopathy:    He has no cervical adenopathy.          Assessment & Plan:

## 2011-12-03 NOTE — Assessment & Plan Note (Signed)
Will try cialis. Offered/has condoms. Given precautions about not taking more than every 48 h. Cautions about possible BP lowering effects.

## 2011-12-03 NOTE — Assessment & Plan Note (Signed)
Will cont his meds.  He appears to be doing well. Watch for ADR due to cialis.

## 2011-12-04 ENCOUNTER — Telehealth: Payer: Self-pay

## 2011-12-04 NOTE — Telephone Encounter (Signed)
Pt did not know correct dose of medication at last visit .  He is taking  atorvastatin  20 mg daily.   Medication updated.  Laurell Josephs, RN

## 2011-12-05 ENCOUNTER — Ambulatory Visit: Payer: Self-pay | Admitting: Infectious Diseases

## 2011-12-30 ENCOUNTER — Telehealth: Payer: Self-pay | Admitting: *Deleted

## 2011-12-30 NOTE — Telephone Encounter (Signed)
Pt lost his job last week needing assistance w/ medications.  Phone call transferred to B. Jeraldine Loots, Oncologist.  May also need PAPs until possible ADAP is approved.  RN recommended [employer]inal[employer] Cloud for PAP applications.

## 2012-01-01 ENCOUNTER — Ambulatory Visit: Payer: Self-pay

## 2012-01-01 ENCOUNTER — Telehealth: Payer: Self-pay | Admitting: Infectious Diseases

## 2012-01-01 NOTE — Telephone Encounter (Signed)
Called Brandon Joyce about assistance with his medication.  I left a message for him to return my call.

## 2012-01-19 ENCOUNTER — Telehealth: Payer: Self-pay | Admitting: Infectious Diseases

## 2012-01-19 ENCOUNTER — Other Ambulatory Visit: Payer: Self-pay | Admitting: *Deleted

## 2012-01-19 DIAGNOSIS — B20 Human immunodeficiency virus [HIV] disease: Secondary | ICD-10-CM

## 2012-01-19 MED ORDER — EFAVIRENZ-EMTRICITAB-TENOFOVIR 600-200-300 MG PO TABS: 1.0000 | ORAL_TABLET | Freq: Every day | ORAL | Status: DC

## 2012-01-19 NOTE — Telephone Encounter (Signed)
I called [employer] and got Mr. Wollin a 30 day emergency supply of Atripla.  Called and left a message for him to come and pick up his voucher.

## 2012-04-30 ENCOUNTER — Ambulatory Visit: Payer: Self-pay

## 2012-05-19 ENCOUNTER — Other Ambulatory Visit (INDEPENDENT_AMBULATORY_CARE_PROVIDER_SITE_OTHER): Payer: Self-pay

## 2012-05-19 DIAGNOSIS — B20 Human immunodeficiency virus [HIV] disease: Secondary | ICD-10-CM

## 2012-05-19 DIAGNOSIS — Z113 Encounter for screening for infections with a predominantly sexual mode of transmission: Secondary | ICD-10-CM

## 2012-05-19 DIAGNOSIS — Z79899 Other long term (current) drug therapy: Secondary | ICD-10-CM

## 2012-05-19 LAB — COMPREHENSIVE METABOLIC PANEL
AST: 21 U/L (ref 0–37)
Albumin: 4.5 g/dL (ref 3.5–5.2)
Alkaline Phosphatase: 63 U/L (ref 39–117)
BUN: 14 mg/dL (ref 6–23)
Calcium: 9.2 mg/dL (ref 8.4–10.5)
Chloride: 105 mEq/L (ref 96–112)
Creat: 1.18 mg/dL (ref 0.50–1.35)
Glucose, Bld: 102 mg/dL — ABNORMAL HIGH (ref 70–99)

## 2012-05-19 LAB — CBC
HCT: 45.7 % (ref 39.0–52.0)
Hemoglobin: 16.1 g/dL (ref 13.0–17.0)
MCHC: 35.2 g/dL (ref 30.0–36.0)
MCV: 85.4 fL (ref 78.0–100.0)
RDW: 15.3 % (ref 11.5–15.5)

## 2012-05-19 LAB — LIPID PANEL
HDL: 38 mg/dL — ABNORMAL LOW (ref >39)
Total CHOL/HDL Ratio: 5.8 Ratio
Triglycerides: 185 mg/dL — ABNORMAL HIGH (ref <150)

## 2012-05-19 LAB — RPR

## 2012-05-27 ENCOUNTER — Ambulatory Visit: Payer: Self-pay

## 2012-06-02 ENCOUNTER — Ambulatory Visit: Payer: Self-pay | Admitting: Infectious Diseases

## 2012-06-07 ENCOUNTER — Ambulatory Visit: Payer: Self-pay | Admitting: Infectious Diseases

## 2012-06-07 ENCOUNTER — Other Ambulatory Visit: Payer: Self-pay | Admitting: Infectious Diseases

## 2012-06-07 DIAGNOSIS — F411 Generalized anxiety disorder: Secondary | ICD-10-CM

## 2012-06-07 DIAGNOSIS — I10 Essential (primary) hypertension: Secondary | ICD-10-CM

## 2012-06-08 ENCOUNTER — Other Ambulatory Visit: Payer: Self-pay | Admitting: Licensed Clinical Social Worker

## 2012-06-08 DIAGNOSIS — F419 Anxiety disorder, unspecified: Secondary | ICD-10-CM

## 2012-06-08 DIAGNOSIS — E785 Hyperlipidemia, unspecified: Secondary | ICD-10-CM

## 2012-06-08 DIAGNOSIS — I10 Essential (primary) hypertension: Secondary | ICD-10-CM

## 2012-06-08 MED ORDER — LISINOPRIL 20 MG PO TABS: 20.0000 mg | ORAL_TABLET | Freq: Every day | ORAL | Status: DC

## 2012-06-08 MED ORDER — LORAZEPAM 0.5 MG PO TABS: 0.5000 mg | ORAL_TABLET | Freq: Every day | ORAL | Status: DC

## 2012-06-14 ENCOUNTER — Ambulatory Visit (INDEPENDENT_AMBULATORY_CARE_PROVIDER_SITE_OTHER): Payer: Self-pay | Admitting: Infectious Diseases

## 2012-06-14 ENCOUNTER — Encounter: Payer: Self-pay | Admitting: Infectious Diseases

## 2012-06-14 VITALS — BP 134/83 | HR 57 | Temp 97.8°F | Wt 169.0 lb

## 2012-06-14 DIAGNOSIS — F1020 Alcohol dependence, uncomplicated: Secondary | ICD-10-CM

## 2012-06-14 DIAGNOSIS — B20 Human immunodeficiency virus [HIV] disease: Secondary | ICD-10-CM

## 2012-06-14 DIAGNOSIS — E785 Hyperlipidemia, unspecified: Secondary | ICD-10-CM

## 2012-06-14 DIAGNOSIS — F411 Generalized anxiety disorder: Secondary | ICD-10-CM

## 2012-06-14 DIAGNOSIS — F419 Anxiety disorder, unspecified: Secondary | ICD-10-CM

## 2012-06-14 MED ORDER — LORAZEPAM 0.5 MG PO TABS: 0.5000 mg | ORAL_TABLET | Freq: Every day | ORAL | Status: DC

## 2012-06-14 NOTE — Assessment & Plan Note (Signed)
He is doing well, CD4 dropped but VL undetectable. Offered/refuses condoms. vax are up to date. Will see him back 4 months.

## 2012-06-14 NOTE — Progress Notes (Signed)
  Subjective:    Patient ID: Brandon Joyce, male    DOB: 1950-02-28, 61 y.o.   MRN: [id number]  HPI 62 yo M with HIV+, statin related myalgias, hyperlipidemia, erectile dysfunction, hypertension.  Has been taking lipitor erratically since previous stopping at visit due to myalgias. Still having myalgias. Has been taking atripla. Having difficulty finding work, truck Psychologist, counselling. Managing his anger/stress in evening by drinking 12 pack of beer. Is going to take course from [facility] for retraining.   HIV 1 RNA Quant (copies/mL)  Date Value  05/19/2012 <20   10/29/2011 <20   07/17/2011 36*     CD4 T Cell Abs (cmm)  Date Value  05/19/2012 470   10/29/2011 710   07/17/2011 700    Lab Results  Component Value Date   CHOL 221* 05/19/2012   HDL 38* 05/19/2012   LDLCALC 146* 05/19/2012   TRIG 185* 05/19/2012   CHOLHDL 5.8 05/19/2012      Review of Systems     Objective:   Physical Exam  Constitutional: He appears well-developed and well-nourished.  HENT:  Mouth/Throat: No oropharyngeal exudate.  Eyes: EOM are normal. Pupils are equal, round, and reactive to light.  Neck: Neck supple.  Cardiovascular: Normal rate, regular rhythm and normal heart sounds.   Pulmonary/Chest: Effort normal and breath sounds normal.  Abdominal: Soft. Bowel sounds are normal. There is no tenderness. There is no rebound.  Musculoskeletal:       Legs: Lymphadenopathy:    He has no cervical adenopathy.          Assessment & Plan:

## 2012-06-14 NOTE — Assessment & Plan Note (Signed)
I offered to get him into counseling here in the clinic. He refuses.

## 2012-06-14 NOTE — Assessment & Plan Note (Signed)
i encouraged him to stop taking the lipitor. Also stop taking the atorvastatin calcium which i let him know is the generic equivalent.

## 2012-10-04 ENCOUNTER — Other Ambulatory Visit: Payer: Self-pay

## 2012-10-06 ENCOUNTER — Other Ambulatory Visit: Payer: Self-pay | Admitting: *Deleted

## 2012-10-06 DIAGNOSIS — B20 Human immunodeficiency virus [HIV] disease: Secondary | ICD-10-CM

## 2012-10-06 MED ORDER — EFAVIRENZ-EMTRICITAB-TENOFOVIR 600-200-300 MG PO TABS: 1.0000 | ORAL_TABLET | Freq: Every day | ORAL | Status: DC

## 2012-10-14 ENCOUNTER — Ambulatory Visit: Payer: Self-pay

## 2012-10-18 ENCOUNTER — Ambulatory Visit: Payer: Self-pay | Admitting: Infectious Diseases

## 2012-11-10 ENCOUNTER — Other Ambulatory Visit (INDEPENDENT_AMBULATORY_CARE_PROVIDER_SITE_OTHER): Payer: Self-pay

## 2012-11-10 DIAGNOSIS — B20 Human immunodeficiency virus [HIV] disease: Secondary | ICD-10-CM

## 2012-11-10 DIAGNOSIS — E785 Hyperlipidemia, unspecified: Secondary | ICD-10-CM

## 2012-11-10 DIAGNOSIS — Z79899 Other long term (current) drug therapy: Secondary | ICD-10-CM

## 2012-11-10 LAB — COMPLETE METABOLIC PANEL WITH GFR
ALT: 33 U/L (ref 0–53)
AST: 22 U/L (ref 0–37)
Albumin: 4.6 g/dL (ref 3.5–5.2)
Alkaline Phosphatase: 62 U/L (ref 39–117)
BUN: 17 mg/dL (ref 6–23)
Potassium: 4.3 mEq/L (ref 3.5–5.3)
Sodium: 139 mEq/L (ref 135–145)

## 2012-11-10 LAB — LIPID PANEL
Cholesterol: 205 mg/dL — ABNORMAL HIGH (ref 0–200)
LDL Cholesterol: 125 mg/dL — ABNORMAL HIGH (ref 0–99)
Total CHOL/HDL Ratio: 4.1 Ratio
VLDL: 30 mg/dL (ref 0–40)

## 2012-11-10 LAB — CBC
Platelets: 250 10*3/uL (ref 150–400)
RBC: 4.99 MIL/uL (ref 4.22–5.81)
WBC: 4.9 10*3/uL (ref 4.0–10.5)

## 2012-11-12 LAB — HIV-1 RNA QUANT-NO REFLEX-BLD: HIV 1 RNA Quant: <20 copies/mL (ref <20)

## 2012-11-12 LAB — T-HELPER CELL (CD4) - (RCID CLINIC ONLY): CD4 % Helper T Cell: 46 % (ref 33–55)

## 2012-11-24 ENCOUNTER — Ambulatory Visit (INDEPENDENT_AMBULATORY_CARE_PROVIDER_SITE_OTHER): Payer: Self-pay | Admitting: Infectious Diseases

## 2012-11-24 ENCOUNTER — Encounter: Payer: Self-pay | Admitting: Infectious Diseases

## 2012-11-24 VITALS — BP 144/87 | HR 74 | Temp 98.3°F | Ht 68.0 in | Wt 176.0 lb

## 2012-11-24 DIAGNOSIS — Z113 Encounter for screening for infections with a predominantly sexual mode of transmission: Secondary | ICD-10-CM

## 2012-11-24 DIAGNOSIS — B20 Human immunodeficiency virus [HIV] disease: Secondary | ICD-10-CM

## 2012-11-24 DIAGNOSIS — Z23 Encounter for immunization: Secondary | ICD-10-CM

## 2012-11-24 DIAGNOSIS — Z79899 Other long term (current) drug therapy: Secondary | ICD-10-CM

## 2012-11-24 NOTE — Progress Notes (Signed)
  Subjective:    Patient ID: Brandon Joyce, male    DOB: Nov 21, 1950, 62 y.o.   MRN: [id number]  HPI 62 yo M with HIV+, statin related myalgias, hyperlipidemia, erectile dysfunction, hypertension.  Is having difficulty, out of work, looking for work.  BPs at home have been normal. Feels well.  Taking atripla.  Wife is wheelchair bound, not sexually active.   HIV 1 RNA Quant (copies/mL)  Date Value  11/10/2012 <20   05/19/2012 <20   10/29/2011 <20      CD4 T Cell Abs (/uL)  Date Value  11/10/2012 670   05/19/2012 470   10/29/2011 710    Review of Systems  Constitutional: Negative for fever, chills, appetite change and unexpected weight change.  Cardiovascular: Negative for chest pain.  Gastrointestinal: Negative for diarrhea and constipation.  Genitourinary: Negative for dysuria.  Neurological: Positive for headaches.  weight up 7# (usually in 180s) Rare headache. He was not sure if sinus or migraine.      Objective:   Physical Exam  Constitutional: He appears well-developed and well-nourished.  HENT:  Mouth/Throat: No oropharyngeal exudate.  Eyes: EOM are normal. Pupils are equal, round, and reactive to light.  Neck: Neck supple.  Cardiovascular: Normal rate, regular rhythm and normal heart sounds.   Pulmonary/Chest: Effort normal and breath sounds normal.  Abdominal: Soft. Bowel sounds are normal. He exhibits no distension. There is no tenderness.  Lymphadenopathy:    He has no cervical adenopathy.          Assessment & Plan:

## 2013-02-23 ENCOUNTER — Other Ambulatory Visit: Payer: Self-pay | Admitting: Infectious Diseases

## 2013-04-05 ENCOUNTER — Other Ambulatory Visit: Payer: Self-pay | Admitting: Infectious Diseases

## 2013-05-25 ENCOUNTER — Other Ambulatory Visit (INDEPENDENT_AMBULATORY_CARE_PROVIDER_SITE_OTHER): Payer: BC Managed Care – PPO

## 2013-05-25 ENCOUNTER — Ambulatory Visit: Payer: Self-pay

## 2013-05-25 DIAGNOSIS — B20 Human immunodeficiency virus [HIV] disease: Secondary | ICD-10-CM

## 2013-05-25 DIAGNOSIS — Z113 Encounter for screening for infections with a predominantly sexual mode of transmission: Secondary | ICD-10-CM

## 2013-05-25 DIAGNOSIS — Z79899 Other long term (current) drug therapy: Secondary | ICD-10-CM

## 2013-05-25 LAB — CBC WITH DIFFERENTIAL/PLATELET
BASOS ABS: 0 10*3/uL (ref 0.0–0.1)
BASOS PCT: 0 % (ref 0–1)
EOS ABS: 0.2 10*3/uL (ref 0.0–0.7)
Eosinophils Relative: 4 % (ref 0–5)
HEMATOCRIT: 41.9 % (ref 39.0–52.0)
Hemoglobin: 14.9 g/dL (ref 13.0–17.0)
LYMPHS ABS: 1.6 10*3/uL (ref 0.7–4.0)
Lymphocytes Relative: 28 % (ref 12–46)
MCH: 30 pg (ref 26.0–34.0)
MCHC: 35.6 g/dL (ref 30.0–36.0)
MCV: 84.5 fL (ref 78.0–100.0)
Monocytes Absolute: 0.5 10*3/uL (ref 0.1–1.0)
Monocytes Relative: 8 % (ref 3–12)
Neutro Abs: 3.5 10*3/uL (ref 1.7–7.7)
Neutrophils Relative %: 60 % (ref 43–77)
Platelets: 229 10*3/uL (ref 150–400)
RBC: 4.96 MIL/uL (ref 4.22–5.81)
RDW: 15.1 % (ref 11.5–15.5)
WBC: 5.8 10*3/uL (ref 4.0–10.5)

## 2013-05-26 LAB — COMPLETE METABOLIC PANEL WITH GFR
ALK PHOS: 59 U/L (ref 39–117)
ALT: 30 U/L (ref 0–53)
AST: 20 U/L (ref 0–37)
Albumin: 4.3 g/dL (ref 3.5–5.2)
BILIRUBIN TOTAL: 0.3 mg/dL (ref 0.2–1.2)
BUN: 17 mg/dL (ref 6–23)
CO2: 19 mEq/L (ref 19–32)
Calcium: 8.9 mg/dL (ref 8.4–10.5)
Chloride: 105 mEq/L (ref 96–112)
Creat: 1.02 mg/dL (ref 0.50–1.35)
GFR, EST NON AFRICAN AMERICAN: 78 mL/min
GFR, Est African American: >89 mL/min
GLUCOSE: 112 mg/dL — AB (ref 70–99)
POTASSIUM: 4.1 meq/L (ref 3.5–5.3)
Sodium: 136 mEq/L (ref 135–145)
TOTAL PROTEIN: 6.7 g/dL (ref 6.0–8.3)

## 2013-05-26 LAB — LIPID PANEL
CHOLESTEROL: 207 mg/dL — AB (ref 0–200)
HDL: 42 mg/dL (ref >39)
LDL Cholesterol: 111 mg/dL — ABNORMAL HIGH (ref 0–99)
TRIGLYCERIDES: 268 mg/dL — AB (ref <150)
Total CHOL/HDL Ratio: 4.9 Ratio
VLDL: 54 mg/dL — ABNORMAL HIGH (ref 0–40)

## 2013-05-26 LAB — HIV-1 RNA QUANT-NO REFLEX-BLD
HIV 1 RNA Quant: <20 copies/mL (ref <20)
HIV-1 RNA Quant, Log: <1.3 {Log} (ref <1.30)

## 2013-05-26 LAB — RPR

## 2013-05-26 LAB — T-HELPER CELL (CD4) - (RCID CLINIC ONLY)
CD4 % Helper T Cell: 45 % (ref 33–55)
CD4 T Cell Abs: 770 /uL (ref 400–2700)

## 2013-06-08 ENCOUNTER — Ambulatory Visit (HOSPITAL_COMMUNITY): Payer: BC Managed Care – PPO

## 2013-06-08 ENCOUNTER — Encounter: Payer: Self-pay | Admitting: Infectious Diseases

## 2013-06-08 ENCOUNTER — Other Ambulatory Visit: Payer: Self-pay

## 2013-06-08 ENCOUNTER — Ambulatory Visit (HOSPITAL_COMMUNITY)
Admission: RE | Admit: 2013-06-08 | Discharge: 2013-06-08 | Disposition: A | Discharge status: Home / Self Care | Payer: BC Managed Care – PPO | Source: Ambulatory Visit | Attending: Infectious Diseases | Admitting: Infectious Diseases

## 2013-06-08 ENCOUNTER — Ambulatory Visit (INDEPENDENT_AMBULATORY_CARE_PROVIDER_SITE_OTHER): Payer: BC Managed Care – PPO | Admitting: Infectious Diseases

## 2013-06-08 VITALS — BP 168/105 | HR 64 | Temp 97.9°F | Wt 169.0 lb

## 2013-06-08 DIAGNOSIS — I498 Other specified cardiac arrhythmias: Secondary | ICD-10-CM | POA: Insufficient documentation

## 2013-06-08 DIAGNOSIS — R42 Dizziness and giddiness: Secondary | ICD-10-CM | POA: Insufficient documentation

## 2013-06-08 DIAGNOSIS — E785 Hyperlipidemia, unspecified: Secondary | ICD-10-CM

## 2013-06-08 DIAGNOSIS — I10 Essential (primary) hypertension: Secondary | ICD-10-CM

## 2013-06-08 DIAGNOSIS — B20 Human immunodeficiency virus [HIV] disease: Secondary | ICD-10-CM

## 2013-06-08 DIAGNOSIS — F1020 Alcohol dependence, uncomplicated: Secondary | ICD-10-CM

## 2013-06-08 MED ORDER — LISINOPRIL-HYDROCHLOROTHIAZIDE 20-12.5 MG PO TABS: 1.0000 | ORAL_TABLET | Freq: Every day | ORAL | Status: DC

## 2013-06-08 MED ORDER — EFAVIRENZ-EMTRICITAB-TENOFOVIR 600-200-300 MG PO TABS: 1.0000 | ORAL_TABLET | Freq: Every day | ORAL | Status: DC

## 2013-06-08 MED ORDER — ASPIRIN 81 MG PO TABS: 160.0000 mg | ORAL_TABLET | Freq: Every day | ORAL | Status: DC

## 2013-06-08 NOTE — Addendum Note (Signed)
Addended by: Jaremy Nosal C on: 06/08/2013 05:15 PM   Modules accepted: Orders

## 2013-06-08 NOTE — Assessment & Plan Note (Signed)
Drinking 2/evening with supper. Was afraid to drink ETOH when he had dizzy spells.

## 2013-06-08 NOTE — Assessment & Plan Note (Addendum)
Not clear if due to HTN, ETOH or other. His ECG shows "NSR 62 bpm, septal infarct, age undetermined". There is mild (< 17mm) downward sloping of ST-T in V5-6. Will have try to have him seen by CV. If he we cannot get him in, he should go to ed if he has further dizzy episodes.

## 2013-06-08 NOTE — Assessment & Plan Note (Addendum)
Appears to be doing well. Need to get him into ADAP coordinator. Will see him back in 3-4 months. Offered/refused condoms. Will recheck his Hep B S Ab.

## 2013-06-08 NOTE — Assessment & Plan Note (Signed)
On further questioning states he has been eating a lot of bacon and eggs, ice cream and popcorn. Encouraged him to watch diet better.

## 2013-06-08 NOTE — Progress Notes (Signed)
   Subjective:    Patient ID: Brandon Joyce, male    DOB: Mar 15, 1950, 63 y.o.   MRN: [id number]  HPI 63 yo M with HIV+, statin related myalgias, hyperlipidemia, erectile dysfunction, hypertension, ETOH use.  Has been off atripla for last 7 days. Thinks that his ADAP has expired/lapsed.  Believes his BP is up today due to stress of losing a unlce's car keys (also 30 minutes late for appt).    HIV 1 RNA Quant (copies/mL)  Date Value  05/25/2013 <20   11/10/2012 <20   05/19/2012 <20      CD4 T Cell Abs (/uL)  Date Value  05/25/2013 770   11/10/2012 670   05/19/2012 470    Lab Results  Component Value Date   CHOL 207* 05/25/2013   HDL 42 05/25/2013   LDLCALC 111* 05/25/2013   TRIG 268* 05/25/2013   CHOLHDL 4.9 05/25/2013    Hep B S Ab (-) 2008.   Review of Systems  Constitutional: Negative for fever, chills, appetite change and unexpected weight change.  Gastrointestinal: Negative for diarrhea and constipation.  Genitourinary: Negative for difficulty urinating.  Neurological: Positive for dizziness.   Having dizzy spells. Describes a pre-syncopal episode with standing up quickly. Then has a sweat. Happened once today, none in prev 48h.     Objective:   Physical Exam  Constitutional: He appears well-developed and well-nourished.  HENT:  Mouth/Throat: No oropharyngeal exudate.  Eyes: EOM are normal. Pupils are equal, round, and reactive to light.  Neck: Neck supple.  Cardiovascular: Normal rate, regular rhythm and normal heart sounds.   Pulmonary/Chest: Effort normal and breath sounds normal.  Abdominal: Soft. Bowel sounds are normal. He exhibits no distension. There is no tenderness.  Lymphadenopathy:    He has no cervical adenopathy.          Assessment & Plan:

## 2013-06-09 ENCOUNTER — Encounter: Payer: Self-pay | Admitting: Infectious Diseases

## 2013-06-10 ENCOUNTER — Telehealth: Payer: Self-pay | Admitting: *Deleted

## 2013-06-10 NOTE — Telephone Encounter (Signed)
Called patient and notified him of appt with [facility] at their [facility] office for 06/15/13 at 12:00 pm with Dr. Stanford Breed. [place], [place] # [phone]. Advised to please call their office if unable to keep appt. Myrtis Hopping

## 2013-06-13 ENCOUNTER — Telehealth: Payer: Self-pay | Admitting: *Deleted

## 2013-06-13 NOTE — Telephone Encounter (Signed)
His heart sounded normal

## 2013-06-13 NOTE — Telephone Encounter (Signed)
Notified patient's wife.  He is keeping his cardiology appointment Wednesday.

## 2013-06-13 NOTE — Telephone Encounter (Signed)
Patent had the following questions after his appointment.  Please see below and advise:  How are you feeling after your recent visit? worried  Does the recommended course of treatment seem to be helping your symptoms? too early to tell  Are you experiencing any side effects from your recommended treatment? no  is there anything else you would like to ask your physician? what did you hear when you listened to my heart

## 2013-06-15 ENCOUNTER — Encounter: Payer: Self-pay | Admitting: Cardiology

## 2013-06-15 ENCOUNTER — Ambulatory Visit (INDEPENDENT_AMBULATORY_CARE_PROVIDER_SITE_OTHER): Payer: BC Managed Care – PPO | Admitting: Cardiology

## 2013-06-15 VITALS — BP 122/70 | HR 50 | Ht 68.0 in | Wt 184.0 lb

## 2013-06-15 DIAGNOSIS — R42 Dizziness and giddiness: Secondary | ICD-10-CM

## 2013-06-15 DIAGNOSIS — E785 Hyperlipidemia, unspecified: Secondary | ICD-10-CM

## 2013-06-15 DIAGNOSIS — F1020 Alcohol dependence, uncomplicated: Secondary | ICD-10-CM

## 2013-06-15 DIAGNOSIS — B20 Human immunodeficiency virus [HIV] disease: Secondary | ICD-10-CM

## 2013-06-15 DIAGNOSIS — I10 Essential (primary) hypertension: Secondary | ICD-10-CM

## 2013-06-15 MED ORDER — ATENOLOL 100 MG PO TABS: 50.0000 mg | ORAL_TABLET | Freq: Every day | ORAL | Status: DC

## 2013-06-15 MED ORDER — LISINOPRIL 40 MG PO TABS: 40.0000 mg | ORAL_TABLET | Freq: Every day | ORAL | Status: DC

## 2013-06-15 NOTE — Assessment & Plan Note (Signed)
Blood pressure is borderline. Given orthostatic symptoms DC HCTZ and decrease atenolol to 50 mg daily. Follow blood pressure at home and adjust regimen as needed.

## 2013-06-15 NOTE — Patient Instructions (Signed)
Your physician recommends that you schedule a follow-up appointment in: [place] has requested that you have an echocardiogram. Echocardiography is a painless test that uses sound waves to create images of your heart. It provides your doctor with information about the size and shape of your heart and how well your heart's chambers and valves are working. This procedure takes approximately one hour. There are no restrictions for this procedure.   STOP LISINOPRIL/HCTZ  INCREASE LISINOPRIL TO 40 MG ONCE DAILY= TWO 20 MG TABLETS ONCE DAILY  DECREASE ATENOLOL TO 50 MG ONCE DAILY= 1/2 OF 50 MG TABLET

## 2013-06-15 NOTE — Assessment & Plan Note (Signed)
Patient symptoms sound to be orthostatic mediated. They typically occur immediately after standing although he did have an episode while sitting recently. He has not had frank syncope. He is noted to be bradycardic on his electrocardiogram. His orthostatic vitals today showed a supine blood pressure of 115/72 with a pulse of 48 and a standing blood pressure of 103/79 and standing heart rate of 48. I will discontinue HCTZ. I have asked him to decrease alcohol intake and increase water intake. Decrease atenolol to 50 mg daily. If symptoms persist we will consider a monitor. Schedule echocardiogram.

## 2013-06-15 NOTE — Assessment & Plan Note (Signed)
Management per infectious disease. I will schedule an echocardiogram to assess LV function and exclude alcohol or HIV -induced cardiomyopathy.

## 2013-06-15 NOTE — Assessment & Plan Note (Signed)
Management per primary care. 

## 2013-06-15 NOTE — Assessment & Plan Note (Signed)
He consumes approximately 3-5 beers per day. I've asked him to decrease this.

## 2013-06-15 NOTE — Progress Notes (Signed)
HPI: 63 year-old male for evaluation of hypertension and dizziness. No prior cardiac history. He has dyspnea with extreme activities but not routine activities. No orthopnea, PND, pedal edema, chest pain or palpitations. Over the past 2 weeks he has had occasional "dizzy spells". These typically occur immediately after standing. He will suddenly feel dizzy which lasts less than 30 seconds and resolves. He has not had frank syncope. No associated chest pain, palpitations or dyspnea. Immediately afterwards he has some diaphoresis. He does state he had a spell while sitting down. Again no frank syncope.  Current Outpatient Prescriptions  Medication Sig Dispense Refill  . aspirin 81 MG tablet Take 2 tablets (162 mg total) by mouth daily.  90 tablet  3  . atenolol (TENORMIN) 100 MG tablet TAKE ONE TABLET BY MOUTH EVERY DAY  30 tablet  3  . efavirenz-emtricitabine-tenofovir (ATRIPLA) 600-200-300 MG per tablet Take 1 tablet by mouth at bedtime.  90 tablet  4  . lisinopril (PRINIVIL,ZESTRIL) 20 MG tablet TAKE ONE TABLET BY MOUTH ONCE DAILY  90 tablet  0  . LORazepam (ATIVAN) 0.5 MG tablet Take 0.5 mg by mouth every 8 (eight) hours. For anxiety       No current facility-administered medications for this visit.    Allergies  Allergen Reactions  . Sulfonamide Derivatives     Past Medical History  Diagnosis Date  . HIV disease   . HTN (hypertension)   . HLD (hyperlipidemia)   . Alcoholism   . ED (erectile dysfunction)   . Pneumonia     Past Surgical History  Procedure Laterality Date  . Tonsillectomy    . Back surgery      History   Social History  . Marital Status: Married    Spouse Name: N/A    Number of Children: 3  . Years of Education: N/A   Occupational History  .  [employer]   Social History Main Topics  . Smoking status: Never Smoker   . Smokeless tobacco: Never Used  . Alcohol Use: 10.5 oz/week    21 drink(s) per week     Comment: beer  . Drug Use: No    . Sexual Activity: Yes    Partners: Female    Birth Control/ Protection: Condom     Comment: accepted condoms today   Other Topics Concern  . Not on file   Social History Narrative  . No narrative on file    Family History  Problem Relation Age of Onset  . Diverticulitis Mother   . Heart disease Father     CABG  . Heart disease Mother     Atrial fibrillation    ROS: no fevers or chills, productive cough, hemoptysis, dysphasia, odynophagia, melena, hematochezia, dysuria, hematuria, rash, seizure activity, orthopnea, PND, pedal edema, claudication. Remaining systems are negative.  Physical Exam:   Blood pressure 122/70, pulse 50, height 5\' 8"  (1.727 m), weight 184 lb (83.462 kg).  General:  Well developed/well nourished in NAD Skin warm/dry; Tattoos Patient not depressed No peripheral clubbing Back-normal HEENT-normal/normal eyelids Neck supple/normal carotid upstroke bilaterally; no bruits; no JVD; no thyromegaly chest - CTA/ normal expansion CV - RRR/normal S1 and S2; no murmurs, rubs or gallops;  PMI nondisplaced Abdomen -NT/ND, no HSM, no mass, + bowel sounds, no bruit 2+ femoral pulses, no bruits Ext-no edema, chords, 2+ DP Neuro-grossly nonfocal  ECG 06/08/2013-sinus rhythm, cannot rule out prior septal infarct, nonspecific lateral T-wave changes.  Electrocardiogram today shows sinus bradycardia  at a rate of 50. Nonspecific ST changes.

## 2013-06-28 ENCOUNTER — Ambulatory Visit (HOSPITAL_COMMUNITY): Payer: BC Managed Care – PPO | Attending: Cardiology | Admitting: Cardiology

## 2013-06-28 ENCOUNTER — Ambulatory Visit (INDEPENDENT_AMBULATORY_CARE_PROVIDER_SITE_OTHER): Payer: BC Managed Care – PPO | Admitting: Cardiology

## 2013-06-28 ENCOUNTER — Encounter: Payer: Self-pay | Admitting: Cardiology

## 2013-06-28 VITALS — BP 140/70 | HR 66 | Wt 184.0 lb

## 2013-06-28 DIAGNOSIS — R079 Chest pain, unspecified: Secondary | ICD-10-CM

## 2013-06-28 DIAGNOSIS — I10 Essential (primary) hypertension: Secondary | ICD-10-CM | POA: Insufficient documentation

## 2013-06-28 DIAGNOSIS — R0609 Other forms of dyspnea: Secondary | ICD-10-CM | POA: Insufficient documentation

## 2013-06-28 DIAGNOSIS — B20 Human immunodeficiency virus [HIV] disease: Secondary | ICD-10-CM

## 2013-06-28 DIAGNOSIS — R42 Dizziness and giddiness: Secondary | ICD-10-CM

## 2013-06-28 DIAGNOSIS — R0602 Shortness of breath: Secondary | ICD-10-CM

## 2013-06-28 DIAGNOSIS — R0989 Other specified symptoms and signs involving the circulatory and respiratory systems: Secondary | ICD-10-CM | POA: Insufficient documentation

## 2013-06-28 DIAGNOSIS — E785 Hyperlipidemia, unspecified: Secondary | ICD-10-CM | POA: Insufficient documentation

## 2013-06-28 NOTE — Progress Notes (Signed)
[facility]. [address], [address], [place]  [address] Phone: [phone] Fax:  [phone]  Date:  06/28/2013   ID:  Brandon Joyce, DOB 1950/07/07, MRN [id number]  PCP:  Brandon Chiquito, MD  Cardiologist: Dr. Stanford Breed History of Present Illness: Brandon Joyce is a 63 y.o. male with history of alcohol use, hypertension, dizziness, HIV who was getting his echocardiogram today, ordered by Dr. Stanford Breed and was stating that he was having chest discomfort, continuous, like a 50 pound bag on his chest. In review of prior note from 06/15/13, there was no complaint of chest discomfort at that time. No frank syncope. Preliminary echocardiogram demonstrated normal ejection fraction with no pericardial effusion.  First dizzy spell, April 1st. Last Wed after Dr. Stanford Breed visit, noted "bad" Chest pain with diaphoresis. As long as working on the line, 50 pound sand bag on the chest. No nausea. Break time, feels fine. None of this 2 months ago. No Fever, no cough.    Wt Readings from Last 3 Encounters:  06/28/13 184 lb (83.462 kg)  06/15/13 184 lb (83.462 kg)  06/08/13 169 lb (76.658 kg)     Past Medical History  Diagnosis Date  . HIV disease   . HTN (hypertension)   . HLD (hyperlipidemia)   . Alcoholism   . ED (erectile dysfunction)   . Pneumonia     Past Surgical History  Procedure Laterality Date  . Tonsillectomy    . Back surgery      Current Outpatient Prescriptions  Medication Sig Dispense Refill  . aspirin 81 MG tablet Take 2 tablets (162 mg total) by mouth daily.  90 tablet  3  . atenolol (TENORMIN) 100 MG tablet Take 0.5 tablets (50 mg total) by mouth daily.  30 tablet  12  . efavirenz-emtricitabine-tenofovir (ATRIPLA) 600-200-300 MG per tablet Take 1 tablet by mouth at bedtime.  90 tablet  4  . lisinopril (PRINIVIL,ZESTRIL) 40 MG tablet Take 1 tablet (40 mg total) by mouth daily.  90 tablet  3  . LORazepam (ATIVAN) 0.5 MG tablet Take 0.5 mg by mouth every 8 (eight) hours.  For anxiety       No current facility-administered medications for this visit.    Allergies:    Allergies  Allergen Reactions  . Sulfonamide Derivatives     Social History:  The patient  reports that he has never smoked. He has never used smokeless tobacco. He reports that he drinks about 10.5 ounces of alcohol per week. He reports that he does not use illicit drugs. Works at [employer].   Family History  Problem Relation Age of Onset  . Diverticulitis Mother   . Heart disease Father     CABG  . Heart disease Mother     Atrial fibrillation    ROS:  Please see the history of present illness.   Positive for chest pain, prior dizziness, lethargy, no bleeding, no strokelike symptoms   All other systems reviewed and negative.   PHYSICAL EXAM: VS:  BP 140/70  Pulse 66  Wt 184 lb (83.462 kg) Well nourished, well developed, in no acute distress HEENT: normal, [place]/AT, EOMI Neck: no JVD, normal carotid upstroke, no bruit Cardiac:  normal S1, S2; RRR; no murmurno rub. No pain with palpation.  Lungs:  clear to auscultation bilaterally, no wheezing, rhonchi or rales Abd: soft, nontender, no hepatomegaly, no bruits Ext: no edema, 2+ distal pulses Skin: warm and dry GU: deferred Neuro: no focal abnormalities noted,  AAO x 3  EKG: 06/08/13: Sinus bradycardia, 57, nonspecific ST-T wave changes    06/28/13-sinus rhythm heart rate 66, nonspecific ST-T wave changes noted. No significant change from prior. Poor R wave progression.  ASSESSMENT AND PLAN:  1. Chest pain-has both typical as well as atypical features. He states that whenever he is working on the [employer] at [employer], moving some heavy equipment around, he is feeling the central chest discomfort. When he takes his break or when he takes lunch after a proximally 5 minutes, the pain is gone. He describes the pain as fairly constant throughout his work day. This and the dizziness was concerning to him. Because of his age, smoking  history, family history of coronary disease, I will go ahead and get him set up for a nuclear stress test. Hold atenolol on morning of test. I would like for him to exercise on the treadmill to see if we can elicit exertional chest discomfort. 2. Dizziness-echocardiogram official read pending. May need decrease in his antihypertensives. Has follow up June 3.   Signed, Candee Furbish, MD [facility]  06/28/2013 5:20 PM

## 2013-06-28 NOTE — Progress Notes (Signed)
Patient complained of recent chest pain on exertion, called triage nurse, routed to [facility], who in turn spoke with Dr Marlou Porch.

## 2013-06-28 NOTE — Patient Instructions (Signed)
Your physician recommends that you schedule a follow-up appointment in: [place] has requested that you have en exercise stress myoview. For further information please visit [email or web address]. Please follow instruction sheet, as given.

## 2013-06-28 NOTE — Progress Notes (Signed)
Echo performed. 

## 2013-06-30 ENCOUNTER — Telehealth (HOSPITAL_COMMUNITY): Payer: Self-pay

## 2013-07-01 ENCOUNTER — Encounter (HOSPITAL_COMMUNITY): Payer: Self-pay

## 2013-07-06 ENCOUNTER — Ambulatory Visit (HOSPITAL_COMMUNITY)
Admission: RE | Admit: 2013-07-06 | Discharge: 2013-07-06 | Disposition: A | Discharge status: Home / Self Care | Payer: BC Managed Care – PPO | Source: Ambulatory Visit | Attending: Cardiovascular Disease | Admitting: Cardiovascular Disease

## 2013-07-06 ENCOUNTER — Encounter (HOSPITAL_COMMUNITY): Payer: Self-pay

## 2013-07-06 DIAGNOSIS — R079 Chest pain, unspecified: Secondary | ICD-10-CM | POA: Insufficient documentation

## 2013-07-06 MED ORDER — TECHNETIUM TC 99M SESTAMIBI GENERIC - CARDIOLITE
30.0000 | Freq: Once | INTRAVENOUS | Status: AC | PRN
  Administered 2013-07-06: 30 via INTRAVENOUS

## 2013-07-06 MED ORDER — TECHNETIUM TC 99M SESTAMIBI GENERIC - CARDIOLITE
10.0000 | Freq: Once | INTRAVENOUS | Status: AC | PRN
  Administered 2013-07-06: 10 via INTRAVENOUS

## 2013-07-06 NOTE — Procedures (Addendum)
[facility] [address] [place] [address] [phone]  Cardiology Nuclear Med Study  Brandon Joyce is a 63 y.o. male     MRN : [id number]     DOB: 1951-01-02  Procedure Date: 07/06/2013  Nuclear Med Background Indication for Stress Test:  Evaluation for Ischemia History:  No prior cardiac or respiratory history reported,No prior NUC MPI for comparison;HIV;alcoholism Cardiac Risk Factors: Family History - CAD, History of Smoking, Hypertension, Lipids and Overweight  Symptoms:  Chest Pain, Dizziness, Fatigue and Light-Headedness   Nuclear Pre-Procedure Caffeine/Decaff Intake:  9:00pm NPO After: 7:00am   IV Site: R Forearm  IV 0.9% NS with Angio Cath:  22g  Chest Size (in):  44"  IV Started by: Rolene Course, RN  Height: 5\' 8"  (1.727 m)  Cup Size: n/a  BMI:  Body mass index is 27.98 kg/(m^2). Weight:  184 lb (83.462 kg)   Tech Comments:  n/a    Nuclear Med Study 1 or 2 day study: 1 day  Stress Test Type:  Stress  Order Authorizing Provider:  Candee Furbish, MD   Resting Radionuclide: Technetium 67m Sestamibi  Resting Radionuclide Dose: 9.9 mCi   Stress Radionuclide:  Technetium 34m Sestamibi  Stress Radionuclide Dose: 31.0 mCi           Stress Protocol Rest HR: 59 Stress HR: 144  Rest BP: 154/95 Stress BP: 200/107  Exercise Time (min): 7:46 METS: 9.7   Predicted Max HR: 158 bpm % Max HR: 91.14 bpm Rate Pressure Product: 31392  Dose of Adenosine (mg):  n/a Dose of Lexiscan: n/a mg  Dose of Atropine (mg): n/a Dose of Dobutamine: n/a mcg/kg/min (at max HR)  Stress Test Technologist: Leane Para, CCT Nuclear Technologist: Otho Perl, CNMT   Rest Procedure:  Myocardial perfusion imaging was performed at rest 45 minutes following the intravenous administration of Technetium 90m Sestamibi. Stress Procedure:  The patient performed treadmill exercise using a Bruce  Protocol for 7:46 minutes. The patient stopped  due to SOB and fatigue and denied any chest pain.  There were significant ST-T wave changes.  Technetium 6m Sestamibi was injected IV at peak exercise and myocardial perfusion imaging was performed after a brief delay.  Transient Ischemic Dilatation (Normal <1.22):  0.96 Lung/Heart Ratio (Normal <0.45):  0.29 QGS EDV:  109 ml QGS ESV:  44 ml LV Ejection Fraction: 59%     Rest ECG: NSR-LVH  Stress ECG: Significant ST abnormalities consistent with ischemia. 2-3 mm horizontal ST depression in multiple leads  QPS Raw Data Images:  Normal; no motion artifact; normal heart/lung ratio. Stress Images:  Normal homogeneous uptake in all areas of the myocardium. Rest Images:  Normal homogeneous uptake in all areas of the myocardium. Subtraction (SDS):  No evidence of ischemia. LV Wall Motion:  NL LV Function; NL Wall Motion  Impression Exercise Capacity:  Fair exercise capacity. BP Response:  Hypertensive blood pressure response. Clinical Symptoms:  The exercise was limited by fatigue. The patient described mild (2/10) non-limiting chest pain.. ECG Impression:  Significant ST abnormalities consistent with ischemia. Specificity is limited by underlying LVH changes. Comparison with Prior Nuclear Study: No previous nuclear study performed   Overall Impression:  Normal stress nuclear study. Suspect "false positive" LVH-related ECG changes, but cannot fully exclude the possibility of "balanced ischemia".   Sanda Klein, MD  07/06/2013 12:54 PM

## 2013-07-13 ENCOUNTER — Encounter (HOSPITAL_COMMUNITY): Payer: Self-pay

## 2013-07-27 ENCOUNTER — Encounter: Payer: Self-pay | Admitting: *Deleted

## 2013-07-27 ENCOUNTER — Ambulatory Visit (INDEPENDENT_AMBULATORY_CARE_PROVIDER_SITE_OTHER): Payer: BC Managed Care – PPO | Admitting: Cardiology

## 2013-07-27 ENCOUNTER — Encounter: Payer: Self-pay | Admitting: Cardiology

## 2013-07-27 ENCOUNTER — Other Ambulatory Visit: Payer: Self-pay | Admitting: Cardiology

## 2013-07-27 VITALS — BP 150/90 | HR 70 | Ht 68.0 in | Wt 181.1 lb

## 2013-07-27 DIAGNOSIS — B20 Human immunodeficiency virus [HIV] disease: Secondary | ICD-10-CM

## 2013-07-27 DIAGNOSIS — R079 Chest pain, unspecified: Secondary | ICD-10-CM | POA: Insufficient documentation

## 2013-07-27 DIAGNOSIS — R072 Precordial pain: Secondary | ICD-10-CM

## 2013-07-27 DIAGNOSIS — I10 Essential (primary) hypertension: Secondary | ICD-10-CM

## 2013-07-27 NOTE — Assessment & Plan Note (Signed)
Symptoms are concerning for angina. They are persistent despite recent negative nuclear study. I feel definitive evaluation is warranted. Plan left heart catheterization. The risks and benefits were discussed and he agrees to proceed. He has been scheduled for tomorrow. Continue aspirin. Add statin if coronary artery disease demonstrated.

## 2013-07-27 NOTE — Progress Notes (Signed)
      HPI: FU hypertension and dizziness. Echocardiogram in May 2015 showed normal LV function. Patient had chest pain during the office visit for echocardiogram and seen by Dr. Marlou Porch. Nuclear study - May of 2015 was normal with ejection fraction 59%. At last office visit I decreased his atenolol and discontinued HCTZ because of orthostatic symptoms. Since he was last seen His dizziness has improved significantly. However he describes lower substernal/epigastric pain. This occurs with vigorous activities such as working and is relieved with rest. It does not occur at rest. No radiation. No nausea but there is dyspnea and diaphoresis. The pain is described as a dull sensation.   Current Outpatient Prescriptions  Medication Sig Dispense Refill  . aspirin 81 MG tablet Take 2 tablets (162 mg total) by mouth daily.  90 tablet  3  . atenolol (TENORMIN) 100 MG tablet Take 0.5 tablets (50 mg total) by mouth daily.  30 tablet  12  . efavirenz-emtricitabine-tenofovir (ATRIPLA) 600-200-300 MG per tablet Take 1 tablet by mouth at bedtime.  90 tablet  4  . lisinopril (PRINIVIL,ZESTRIL) 40 MG tablet Take 1 tablet (40 mg total) by mouth daily.  90 tablet  3  . LORazepam (ATIVAN) 0.5 MG tablet Take 0.5 mg by mouth every 8 (eight) hours. For anxiety       No current facility-administered medications for this visit.     Past Medical History  Diagnosis Date  . HIV disease   . HTN (hypertension)   . HLD (hyperlipidemia)   . Alcoholism   . ED (erectile dysfunction)   . Pneumonia     Past Surgical History  Procedure Laterality Date  . Tonsillectomy    . Back surgery      History   Social History  . Marital Status: Married    Spouse Name: N/A    Number of Children: 3  . Years of Education: N/A   Occupational History  .  [employer]   Social History Main Topics  . Smoking status: Never Smoker   . Smokeless tobacco: Never Used  . Alcohol Use: 10.5 oz/week    21 drink(s) per week   Comment: beer  . Drug Use: No  . Sexual Activity: Yes    Partners: Female    Birth Control/ Protection: Condom     Comment: accepted condoms today   Other Topics Concern  . Not on file   Social History Narrative  . No narrative on file    ROS: no fevers or chills, productive cough, hemoptysis, dysphasia, odynophagia, melena, hematochezia, dysuria, hematuria, rash, seizure activity, orthopnea, PND, pedal edema, claudication. Remaining systems are negative.  Physical Exam: Well-developed well-nourished in no acute distress.  Skin is warm and dry.  HEENT is normal.  Neck is supple.  Chest is clear to auscultation with normal expansion.  Cardiovascular exam is regular rate and rhythm.  Abdominal exam nontender or distended. No masses palpated. Extremities show no edema. neuro grossly intact

## 2013-07-27 NOTE — Assessment & Plan Note (Signed)
Blood pressure is mildly elevated but he is following this routinely at home and it is typically controlled. His orthostatic symptoms are markedly improved after discontinuing HCTZ and decreasing atenolol. Continue to follow.

## 2013-07-27 NOTE — Assessment & Plan Note (Signed)
Followed by infectious disease.  

## 2013-07-27 NOTE — Patient Instructions (Signed)

## 2013-07-28 ENCOUNTER — Encounter (HOSPITAL_COMMUNITY)
Admission: RE | Disposition: A | Discharge status: Home / Self Care | Payer: Self-pay | Source: Ambulatory Visit | Attending: Interventional Cardiology

## 2013-07-28 ENCOUNTER — Ambulatory Visit (HOSPITAL_COMMUNITY)
Admission: RE | Admit: 2013-07-28 | Discharge: 2013-07-28 | Disposition: A | Discharge status: Home / Self Care | Payer: BC Managed Care – PPO | Source: Ambulatory Visit | Attending: Interventional Cardiology | Admitting: Interventional Cardiology

## 2013-07-28 DIAGNOSIS — I10 Essential (primary) hypertension: Secondary | ICD-10-CM | POA: Insufficient documentation

## 2013-07-28 DIAGNOSIS — F1020 Alcohol dependence, uncomplicated: Secondary | ICD-10-CM | POA: Insufficient documentation

## 2013-07-28 DIAGNOSIS — Z7982 Long term (current) use of aspirin: Secondary | ICD-10-CM | POA: Insufficient documentation

## 2013-07-28 DIAGNOSIS — N529 Male erectile dysfunction, unspecified: Secondary | ICD-10-CM | POA: Insufficient documentation

## 2013-07-28 DIAGNOSIS — Z21 Asymptomatic human immunodeficiency virus [HIV] infection status: Secondary | ICD-10-CM | POA: Insufficient documentation

## 2013-07-28 DIAGNOSIS — R072 Precordial pain: Secondary | ICD-10-CM

## 2013-07-28 DIAGNOSIS — I2510 Atherosclerotic heart disease of native coronary artery without angina pectoris: Secondary | ICD-10-CM | POA: Insufficient documentation

## 2013-07-28 DIAGNOSIS — E785 Hyperlipidemia, unspecified: Secondary | ICD-10-CM | POA: Insufficient documentation

## 2013-07-28 HISTORY — PX: LEFT HEART CATHETERIZATION WITH CORONARY ANGIOGRAM: SHX5451

## 2013-07-28 LAB — BASIC METABOLIC PANEL
BUN: 20 mg/dL (ref 6–23)
CALCIUM: 9.4 mg/dL (ref 8.4–10.5)
CO2: 22 mEq/L (ref 19–32)
Chloride: 103 mEq/L (ref 96–112)
Creatinine, Ser: 0.92 mg/dL (ref 0.50–1.35)
GFR calc non Af Amer: 88 mL/min — ABNORMAL LOW (ref >90)
Glucose, Bld: 106 mg/dL — ABNORMAL HIGH (ref 70–99)
Potassium: 4.4 mEq/L (ref 3.7–5.3)
Sodium: 137 mEq/L (ref 137–147)

## 2013-07-28 LAB — CBC
HEMATOCRIT: 43.6 % (ref 39.0–52.0)
Hemoglobin: 15.7 g/dL (ref 13.0–17.0)
MCH: 32.2 pg (ref 26.0–34.0)
MCHC: 36 g/dL (ref 30.0–36.0)
MCV: 89.3 fL (ref 78.0–100.0)
PLATELETS: 241 10*3/uL (ref 150–400)
RBC: 4.88 MIL/uL (ref 4.22–5.81)
RDW: 13.9 % (ref 11.5–15.5)
WBC: 5.1 10*3/uL (ref 4.0–10.5)

## 2013-07-28 LAB — PROTIME-INR
INR: 0.89 (ref 0.00–1.49)
PROTHROMBIN TIME: 11.9 s (ref 11.6–15.2)

## 2013-07-28 SURGERY — LEFT HEART CATHETERIZATION WITH CORONARY ANGIOGRAM
Anesthesia: LOCAL

## 2013-07-28 MED ORDER — SODIUM CHLORIDE 0.9 % IJ SOLN: 3.0000 mL | INTRAMUSCULAR | Status: DC | PRN

## 2013-07-28 MED ORDER — SODIUM CHLORIDE 0.9 % IV SOLN: 1.0000 mL/kg/h | INTRAVENOUS | Status: DC

## 2013-07-28 MED ORDER — SODIUM CHLORIDE 0.9 % IV SOLN: 250.0000 mL | INTRAVENOUS | Status: DC | PRN

## 2013-07-28 MED ORDER — SODIUM CHLORIDE 0.9 % IJ SOLN: 3.0000 mL | Freq: Two times a day (BID) | INTRAMUSCULAR | Status: DC

## 2013-07-28 MED ORDER — FENTANYL CITRATE 0.05 MG/ML IJ SOLN
INTRAMUSCULAR | Status: AC
  Filled 2013-07-28: qty 2

## 2013-07-28 MED ORDER — VERAPAMIL HCL 2.5 MG/ML IV SOLN
INTRAVENOUS | Status: AC
  Filled 2013-07-28: qty 2

## 2013-07-28 MED ORDER — LIDOCAINE HCL (PF) 1 % IJ SOLN
INTRAMUSCULAR | Status: AC
  Filled 2013-07-28: qty 30

## 2013-07-28 MED ORDER — MIDAZOLAM HCL 2 MG/2ML IJ SOLN
INTRAMUSCULAR | Status: AC
  Filled 2013-07-28: qty 2

## 2013-07-28 MED ORDER — HEPARIN (PORCINE) IN NACL 2-0.9 UNIT/ML-% IJ SOLN
INTRAMUSCULAR | Status: AC
  Filled 2013-07-28: qty 500

## 2013-07-28 MED ORDER — SODIUM CHLORIDE 0.9 % IV SOLN
1.0000 mL/kg/h | INTRAVENOUS | Status: DC
  Administered 2013-07-28: 1 mL/kg/h via INTRAVENOUS

## 2013-07-28 MED ORDER — ASPIRIN 81 MG PO CHEW: 81.0000 mg | CHEWABLE_TABLET | ORAL | Status: DC

## 2013-07-28 NOTE — CV Procedure (Signed)
       PROCEDURE:  Left heart catheterization with selective coronary angiography, left ventriculogram.  INDICATIONS:    The risks, benefits, and details of the procedure were explained to the patient.  The patient verbalized understanding and wanted to proceed.  Informed written consent was obtained.  PROCEDURE TECHNIQUE:  After Xylocaine anesthesia a 77F slender sheath was placed in the right radial artery with a single anterior needle wall stick. IV heparin was given.  Right coronary angiography was done using a Judkins R4 guide catheter.  Left coronary angiography was done using a Judkins L3.5 guide catheter.  Left ventriculography was done using a pigtail catheter.  A TR band was used for hemostasis.   CONTRAST:  Total of 50 cc.  COMPLICATIONS:  None.    HEMODYNAMICS:  Aortic pressure was 86/47; LV pressure was 87/0; LVEDP 8.  There was no gradient between the left ventricle and aorta.    ANGIOGRAPHIC DATA:   The left main coronary artery is widely patent.  The left anterior descending artery is a large vessel which wraps around the apex. The first diagonal is large and widely patent. There is minimal atherosclerosis in the LAD system.  The left circumflex artery is a large dominant vessel. There is minimal atherosclerosis throughout the vessel. The first significant obtuse marginal is medium-sized and patent. The second obtuse marginal is large and widely patent. There are several medium size obtuse marginals which originate further down the vessel. The left PDA is widely patent.  The right coronary artery is a nondominant vessel. It is widely patent.  LEFT VENTRICULOGRAM:  Left ventricular angiogram was done in the 30 RAO projection and revealed normal left ventricular wall motion and systolic function with an estimated ejection fraction of 60%.  LVEDP was 8 mmHg.  IMPRESSIONS:  1. Widely patent left main coronary artery. 2. Minimal atherosclerosis in the left anterior  descending artery and its branches. 3. Mild atherosclerosis in the left circumflex artery and its branches. 4. Patent, nondominant right coronary artery. 5. Normal left ventricular systolic function.  LVEDP 8 mmHg.  Ejection fraction 60%.  RECOMMENDATION:  Nonobstructive coronary artery disease. Continue preventative therapy. Patient will followup with Dr. Stanford Breed.

## 2013-07-28 NOTE — H&P (View-Only) (Signed)
      HPI: FU hypertension and dizziness. Echocardiogram in May 2015 showed normal LV function. Patient had chest pain during the office visit for echocardiogram and seen by Dr. Skains. Nuclear study - May of 2015 was normal with ejection fraction 59%. At last office visit I decreased his atenolol and discontinued HCTZ because of orthostatic symptoms. Since he was last seen His dizziness has improved significantly. However he describes lower substernal/epigastric pain. This occurs with vigorous activities such as working and is relieved with rest. It does not occur at rest. No radiation. No nausea but there is dyspnea and diaphoresis. The pain is described as a dull sensation.   Current Outpatient Prescriptions  Medication Sig Dispense Refill  . aspirin 81 MG tablet Take 2 tablets (162 mg total) by mouth daily.  90 tablet  3  . atenolol (TENORMIN) 100 MG tablet Take 0.5 tablets (50 mg total) by mouth daily.  30 tablet  12  . efavirenz-emtricitabine-tenofovir (ATRIPLA) 600-200-300 MG per tablet Take 1 tablet by mouth at bedtime.  90 tablet  4  . lisinopril (PRINIVIL,ZESTRIL) 40 MG tablet Take 1 tablet (40 mg total) by mouth daily.  90 tablet  3  . LORazepam (ATIVAN) 0.5 MG tablet Take 0.5 mg by mouth every 8 (eight) hours. For anxiety       No current facility-administered medications for this visit.     Past Medical History  Diagnosis Date  . HIV disease   . HTN (hypertension)   . HLD (hyperlipidemia)   . Alcoholism   . ED (erectile dysfunction)   . Pneumonia     Past Surgical History  Procedure Laterality Date  . Tonsillectomy    . Back surgery      History   Social History  . Marital Status: Married    Spouse Name: N/A    Number of Children: 3  . Years of Education: N/A   Occupational History  .  [employer]   Social History Main Topics  . Smoking status: Never Smoker   . Smokeless tobacco: Never Used  . Alcohol Use: 10.5 oz/week    21 drink(s) per week   Comment: beer  . Drug Use: No  . Sexual Activity: Yes    Partners: Female    Birth Control/ Protection: Condom     Comment: accepted condoms today   Other Topics Concern  . Not on file   Social History Narrative  . No narrative on file    ROS: no fevers or chills, productive cough, hemoptysis, dysphasia, odynophagia, melena, hematochezia, dysuria, hematuria, rash, seizure activity, orthopnea, PND, pedal edema, claudication. Remaining systems are negative.  Physical Exam: Well-developed well-nourished in no acute distress.  Skin is warm and dry.  HEENT is normal.  Neck is supple.  Chest is clear to auscultation with normal expansion.  Cardiovascular exam is regular rate and rhythm.  Abdominal exam nontender or distended. No masses palpated. Extremities show no edema. neuro grossly intact       

## 2013-07-28 NOTE — Interval H&P Note (Signed)
Cath Lab Visit (complete for each Cath Lab visit)  Clinical Evaluation Leading to the Procedure:   ACS: no  Non-ACS:    Anginal Classification: CCS III  Anti-ischemic medical therapy: Minimal Therapy (1 class of medications)  Non-Invasive Test Results: Low-risk stress test findings: cardiac mortality <1%/year  Prior CABG: No previous CABG      History and Physical Interval Note:  07/28/2013 1:03 PM  Brandon Joyce  has presented today for surgery, with the diagnosis of chest pain  The various methods of treatment have been discussed with the patient and family. After consideration of risks, benefits and other options for treatment, the patient has consented to  Procedure(s): LEFT HEART CATHETERIZATION WITH CORONARY ANGIOGRAM (N/A) as a surgical intervention .  The patient's history has been reviewed, patient examined, no change in status, stable for surgery.  I have reviewed the patient's chart and labs.  Questions were answered to the patient's satisfaction.     Jettie Booze

## 2013-07-28 NOTE — Discharge Instructions (Signed)

## 2013-08-01 ENCOUNTER — Telehealth: Payer: Self-pay | Admitting: Cardiology

## 2013-08-01 NOTE — Telephone Encounter (Signed)
Pt had a cardiac cath on 07/28/13. After D/C from the hospital this past Thursday he was given a note to go back to work with restrictions to not  lift  more than 5 lbs. Pt needs a letter stating that he can return to work without restrictions. He would like to return to work  this Wednesday 08/03/13. Pt is aware that will send this message to MD for recommendations.

## 2013-08-01 NOTE — Telephone Encounter (Signed)
New message     patient need a note stating he can return to work .     The note was not given by hospital  need more information.    Procedure done by Dr. Christ Kick

## 2013-08-01 NOTE — Telephone Encounter (Signed)
Ok to return to work [employer]

## 2013-08-02 ENCOUNTER — Encounter: Payer: Self-pay | Admitting: *Deleted

## 2013-08-02 ENCOUNTER — Telehealth: Payer: Self-pay | Admitting: *Deleted

## 2013-08-02 ENCOUNTER — Other Ambulatory Visit: Payer: Self-pay | Admitting: Infectious Diseases

## 2013-08-02 NOTE — Telephone Encounter (Signed)
Informed patient his return to work letter is ready.

## 2013-08-02 NOTE — Telephone Encounter (Signed)
Patient needs note to return to work. Patient would like to pick up note today to return on Thursday the 11th with NO restrictions. Please call when ready [phone].

## 2013-08-02 NOTE — Telephone Encounter (Signed)
Patient called and advised that he wants a refill on his Ativan. Called the patient back to advise him that we have not filled the Rx for him since 2014 and if he needs it he will need an appt to talk to the doctor about why.

## 2013-08-02 NOTE — Telephone Encounter (Signed)
Letter generated to return to work as of Thursday, 07/25/13 with no restrictions. Will place letter at front desk. Tried to reach patient at [phone], male answered x2 but was unable to hear me on the line.

## 2013-08-12 ENCOUNTER — Telehealth: Payer: Self-pay | Admitting: Cardiology

## 2013-08-12 NOTE — Telephone Encounter (Signed)
Spoke with pt mother, aware paperwork is ready for pick up

## 2013-08-12 NOTE — Telephone Encounter (Signed)
New Message   Pt called to discuss FMLA forms. Please call

## 2013-08-12 NOTE — Telephone Encounter (Signed)
Left message for patient mother to call, paperwork is ready for pick up.

## 2013-08-24 NOTE — Telephone Encounter (Signed)
Encounter complete. 

## 2013-09-28 ENCOUNTER — Other Ambulatory Visit: Payer: BC Managed Care – PPO

## 2013-09-28 DIAGNOSIS — B20 Human immunodeficiency virus [HIV] disease: Secondary | ICD-10-CM

## 2013-09-28 LAB — CBC
HCT: 43.6 % (ref 39.0–52.0)
Hemoglobin: 15.1 g/dL (ref 13.0–17.0)
MCH: 30.2 pg (ref 26.0–34.0)
MCHC: 34.6 g/dL (ref 30.0–36.0)
MCV: 87.2 fL (ref 78.0–100.0)
Platelets: 235 10*3/uL (ref 150–400)
RBC: 5 MIL/uL (ref 4.22–5.81)
RDW: 15 % (ref 11.5–15.5)
WBC: 4.3 10*3/uL (ref 4.0–10.5)

## 2013-09-29 LAB — COMPLETE METABOLIC PANEL WITH GFR
ALT: 34 U/L (ref 0–53)
AST: 23 U/L (ref 0–37)
Albumin: 4.5 g/dL (ref 3.5–5.2)
Alkaline Phosphatase: 60 U/L (ref 39–117)
BUN: 15 mg/dL (ref 6–23)
CO2: 22 mEq/L (ref 19–32)
Calcium: 9.2 mg/dL (ref 8.4–10.5)
Chloride: 106 mEq/L (ref 96–112)
Creat: 0.94 mg/dL (ref 0.50–1.35)
GFR, Est African American: >89 mL/min
GFR, Est Non African American: 86 mL/min
Glucose, Bld: 93 mg/dL (ref 70–99)
Potassium: 4.3 mEq/L (ref 3.5–5.3)
Sodium: 140 mEq/L (ref 135–145)
Total Bilirubin: 0.3 mg/dL (ref 0.2–1.2)
Total Protein: 6.8 g/dL (ref 6.0–8.3)

## 2013-09-29 LAB — HEPATITIS B SURFACE ANTIBODY,QUALITATIVE: HEP B S AB: NEGATIVE

## 2013-09-30 LAB — T-HELPER CELL (CD4) - (RCID CLINIC ONLY)
CD4 T CELL ABS: 620 /uL (ref 400–2700)
CD4 T CELL HELPER: 44 % (ref 33–55)

## 2013-09-30 LAB — HIV-1 RNA QUANT-NO REFLEX-BLD: HIV-1 RNA Quant, Log: <1.3 {Log} (ref <1.30)

## 2013-10-05 ENCOUNTER — Ambulatory Visit: Payer: Self-pay | Admitting: Cardiology

## 2013-10-12 ENCOUNTER — Ambulatory Visit (INDEPENDENT_AMBULATORY_CARE_PROVIDER_SITE_OTHER): Payer: BC Managed Care – PPO | Admitting: Infectious Diseases

## 2013-10-12 ENCOUNTER — Encounter: Payer: Self-pay | Admitting: Infectious Diseases

## 2013-10-12 VITALS — BP 122/80 | HR 59 | Temp 97.9°F | Ht 67.0 in | Wt 179.0 lb

## 2013-10-12 DIAGNOSIS — G4700 Insomnia, unspecified: Secondary | ICD-10-CM

## 2013-10-12 DIAGNOSIS — Z23 Encounter for immunization: Secondary | ICD-10-CM

## 2013-10-12 DIAGNOSIS — Z113 Encounter for screening for infections with a predominantly sexual mode of transmission: Secondary | ICD-10-CM

## 2013-10-12 DIAGNOSIS — N486 Induration penis plastica: Secondary | ICD-10-CM

## 2013-10-12 DIAGNOSIS — B20 Human immunodeficiency virus [HIV] disease: Secondary | ICD-10-CM

## 2013-10-12 DIAGNOSIS — Z79899 Other long term (current) drug therapy: Secondary | ICD-10-CM

## 2013-10-12 DIAGNOSIS — E785 Hyperlipidemia, unspecified: Secondary | ICD-10-CM

## 2013-10-12 MED ORDER — LORAZEPAM 0.5 MG PO TABS: 0.5000 mg | ORAL_TABLET | Freq: Two times a day (BID) | ORAL | Status: DC | PRN

## 2013-10-12 NOTE — Assessment & Plan Note (Signed)
Will refill his ativan.

## 2013-10-12 NOTE — Assessment & Plan Note (Addendum)
Has been doing well. I wrote down his labs. i suggested that it is ok for him to get varicella vaccine, explained his his CD4 and this. Will restart his Hep B series.  Will see him back in 6 months.

## 2013-10-12 NOTE — Assessment & Plan Note (Signed)
Will have him seen by urology.  

## 2013-10-12 NOTE — Progress Notes (Signed)
   Subjective:    Patient ID: Brandon Joyce, male    DOB: 1950/06/08, 63 y.o.   MRN: [id number]  HPI 63 yo M with HIV+, statin related myalgias, hyperlipidemia, erectile dysfunction, hypertension, ETOH use.  He had cardiac cath 07-2013 that showed non-obstr disease. He missed his last CV appt.  Currently taking atripla.  Needs refill of sleeper/ativan.  Has noted over last few months that his penis is bending.   HIV 1 RNA Quant (copies/mL)  Date Value  09/28/2013 <20   05/25/2013 <20   11/10/2012 <20      CD4 T Cell Abs (/uL)  Date Value  09/28/2013 620   05/25/2013 770   11/10/2012 670     Review of Systems  Constitutional: Negative for appetite change and unexpected weight change.  Gastrointestinal: Negative for diarrhea and constipation.  Genitourinary: Negative for difficulty urinating.  Psychiatric/Behavioral: Positive for sleep disturbance.      Objective:   Physical Exam  Constitutional: He appears well-developed and well-nourished.  HENT:  Mouth/Throat: No oropharyngeal exudate.  Eyes: EOM are normal. Pupils are equal, round, and reactive to light.  Neck: Neck supple.  Cardiovascular: Normal rate, regular rhythm and normal heart sounds.   Pulmonary/Chest: Effort normal and breath sounds normal.  Abdominal: Soft. Bowel sounds are normal. He exhibits no distension. There is no tenderness.  Lymphadenopathy:    He has no cervical adenopathy.          Assessment & Plan:

## 2013-10-12 NOTE — Assessment & Plan Note (Signed)
His Chol is fairly stable, his Trig have increased. Have asked him to watch diet, exercise.

## 2013-10-12 NOTE — Addendum Note (Signed)
Addended by: Landis Gandy on: 10/12/2013 05:03 PM   Modules accepted: Orders

## 2013-10-25 ENCOUNTER — Telehealth: Payer: Self-pay | Admitting: *Deleted

## 2013-10-25 NOTE — Telephone Encounter (Signed)
Urology referral sent.

## 2013-12-07 ENCOUNTER — Other Ambulatory Visit: Payer: Self-pay | Admitting: Infectious Diseases

## 2013-12-09 ENCOUNTER — Other Ambulatory Visit: Payer: Self-pay

## 2013-12-14 ENCOUNTER — Ambulatory Visit: Payer: Self-pay

## 2014-01-05 ENCOUNTER — Other Ambulatory Visit: Payer: Self-pay | Admitting: Infectious Diseases

## 2014-02-02 ENCOUNTER — Encounter (HOSPITAL_COMMUNITY): Payer: Self-pay | Admitting: Interventional Cardiology

## 2014-02-09 ENCOUNTER — Other Ambulatory Visit: Payer: Self-pay | Admitting: Infectious Diseases

## 2014-04-06 ENCOUNTER — Other Ambulatory Visit: Payer: BLUE CROSS/BLUE SHIELD

## 2014-04-06 DIAGNOSIS — Z79899 Other long term (current) drug therapy: Secondary | ICD-10-CM

## 2014-04-06 DIAGNOSIS — B20 Human immunodeficiency virus [HIV] disease: Secondary | ICD-10-CM

## 2014-04-06 DIAGNOSIS — Z113 Encounter for screening for infections with a predominantly sexual mode of transmission: Secondary | ICD-10-CM

## 2014-04-06 LAB — CBC
HCT: 42.2 % (ref 39.0–52.0)
HEMOGLOBIN: 14.8 g/dL (ref 13.0–17.0)
MCH: 30.9 pg (ref 26.0–34.0)
MCHC: 35.1 g/dL (ref 30.0–36.0)
MCV: 88.1 fL (ref 78.0–100.0)
MPV: 9.4 fL (ref 8.6–12.4)
Platelets: 223 10*3/uL (ref 150–400)
RBC: 4.79 MIL/uL (ref 4.22–5.81)
RDW: 14.5 % (ref 11.5–15.5)
WBC: 5.1 10*3/uL (ref 4.0–10.5)

## 2014-04-07 LAB — LIPID PANEL
Cholesterol: 181 mg/dL (ref 0–200)
HDL: 45 mg/dL (ref >39)
LDL CALC: 99 mg/dL (ref 0–99)
Total CHOL/HDL Ratio: 4 Ratio
Triglycerides: 185 mg/dL — ABNORMAL HIGH (ref <150)
VLDL: 37 mg/dL (ref 0–40)

## 2014-04-07 LAB — COMPREHENSIVE METABOLIC PANEL
ALBUMIN: 4.5 g/dL (ref 3.5–5.2)
ALT: 46 U/L (ref 0–53)
AST: 28 U/L (ref 0–37)
Alkaline Phosphatase: 64 U/L (ref 39–117)
BILIRUBIN TOTAL: 0.3 mg/dL (ref 0.2–1.2)
BUN: 20 mg/dL (ref 6–23)
CALCIUM: 9.4 mg/dL (ref 8.4–10.5)
CHLORIDE: 103 meq/L (ref 96–112)
CO2: 21 meq/L (ref 19–32)
Creat: 1.01 mg/dL (ref 0.50–1.35)
GLUCOSE: 94 mg/dL (ref 70–99)
Potassium: 4.5 mEq/L (ref 3.5–5.3)
SODIUM: 135 meq/L (ref 135–145)
TOTAL PROTEIN: 7.1 g/dL (ref 6.0–8.3)

## 2014-04-07 LAB — T-HELPER CELL (CD4) - (RCID CLINIC ONLY)
CD4 T CELL ABS: 410 /uL (ref 400–2700)
CD4 T CELL HELPER: 36 % (ref 33–55)

## 2014-04-07 LAB — RPR

## 2014-04-08 LAB — HIV-1 RNA QUANT-NO REFLEX-BLD
HIV 1 RNA Quant: <20 copies/mL (ref <20)
HIV-1 RNA Quant, Log: <1.3 {Log} (ref <1.30)

## 2014-04-10 ENCOUNTER — Other Ambulatory Visit: Payer: Self-pay | Admitting: Infectious Diseases

## 2014-04-11 ENCOUNTER — Other Ambulatory Visit: Payer: Self-pay | Admitting: *Deleted

## 2014-04-11 MED ORDER — LORAZEPAM 0.5 MG PO TABS: ORAL_TABLET | ORAL | Status: DC

## 2014-04-17 ENCOUNTER — Ambulatory Visit (INDEPENDENT_AMBULATORY_CARE_PROVIDER_SITE_OTHER): Payer: BLUE CROSS/BLUE SHIELD | Admitting: Infectious Diseases

## 2014-04-17 ENCOUNTER — Encounter: Payer: Self-pay | Admitting: Infectious Diseases

## 2014-04-17 VITALS — BP 122/70 | HR 54 | Temp 98.1°F | Wt 184.0 lb

## 2014-04-17 DIAGNOSIS — I10 Essential (primary) hypertension: Secondary | ICD-10-CM | POA: Diagnosis not present

## 2014-04-17 DIAGNOSIS — Z23 Encounter for immunization: Secondary | ICD-10-CM | POA: Diagnosis not present

## 2014-04-17 DIAGNOSIS — N486 Induration penis plastica: Secondary | ICD-10-CM | POA: Diagnosis not present

## 2014-04-17 DIAGNOSIS — B20 Human immunodeficiency virus [HIV] disease: Secondary | ICD-10-CM

## 2014-04-17 DIAGNOSIS — E785 Hyperlipidemia, unspecified: Secondary | ICD-10-CM

## 2014-04-17 NOTE — Assessment & Plan Note (Signed)
Greatly appreciate uro f/u.  Will defer cialis til he has seen urology.

## 2014-04-17 NOTE — Assessment & Plan Note (Signed)
Will get him in for repeat colonoscopy.  Has concerns about hemmerhoid.  Otherwise doing very well. Doubt significance of his CD4 drop.  Repeating Hep B series.  Offered/given condoms.

## 2014-04-17 NOTE — Progress Notes (Signed)
   Subjective:    Patient ID: Brandon Joyce, male    DOB: [birth year], 64 y.o.   MRN: [id number]  HPI 64 yo M with HIV+, statin related myalgias, hyperlipidemia, erectile dysfunction, hypertension, ETOH use.  He had cardiac cath 07-2013 that showed non-obstr disease. He missed his last CV appt.  Currently taking atripla.  Working hard, builds school buses.   HIV 1 RNA QUANT (copies/mL)  Date Value  04/06/2014 <20  09/28/2013 <20  05/25/2013 <20   CD4 T CELL ABS (/uL)  Date Value  04/06/2014 410  09/28/2013 620  05/25/2013 770   Saw urology for peyronie's. He was told he has a small blockage, will get injection for this.   Needs colon  Review of Systems  Constitutional: Negative for appetite change and unexpected weight change.  Gastrointestinal: Negative for diarrhea and constipation.  Genitourinary: Negative for difficulty urinating.       Erectile dysfunciton       Objective:   Physical Exam  Constitutional: He appears well-developed and well-nourished.  HENT:  Mouth/Throat: No oropharyngeal exudate.  Eyes: EOM are normal. Pupils are equal, round, and reactive to light.  Neck: Neck supple.  Cardiovascular: Normal rate, regular rhythm and normal heart sounds.   Pulmonary/Chest: Effort normal and breath sounds normal.  Abdominal: Soft. Bowel sounds are normal. There is no tenderness.  Lymphadenopathy:    He has no cervical adenopathy.       Assessment & Plan:

## 2014-04-17 NOTE — Assessment & Plan Note (Signed)
Con't lipitor  

## 2014-04-17 NOTE — Assessment & Plan Note (Signed)
Well controlled, on ACE-I and beta-blocker.

## 2014-04-18 ENCOUNTER — Telehealth: Payer: Self-pay | Admitting: *Deleted

## 2014-04-18 NOTE — Telephone Encounter (Signed)
Notified patient that his referral has been sent to [facility]. I verified they have received it and will contact patient with appointment. Myrtis Hopping

## 2014-04-20 ENCOUNTER — Encounter: Payer: Self-pay | Admitting: Gastroenterology

## 2014-05-11 ENCOUNTER — Other Ambulatory Visit: Payer: Self-pay | Admitting: Infectious Diseases

## 2014-05-11 DIAGNOSIS — E785 Hyperlipidemia, unspecified: Secondary | ICD-10-CM

## 2014-05-11 MED ORDER — ATORVASTATIN CALCIUM 20 MG PO TABS: 20.0000 mg | ORAL_TABLET | Freq: Every day | ORAL | Status: DC

## 2014-06-13 ENCOUNTER — Ambulatory Visit: Payer: Self-pay | Admitting: Gastroenterology

## 2014-06-16 ENCOUNTER — Other Ambulatory Visit: Payer: Self-pay | Admitting: Infectious Diseases

## 2014-06-16 DIAGNOSIS — B20 Human immunodeficiency virus [HIV] disease: Secondary | ICD-10-CM

## 2014-07-20 ENCOUNTER — Other Ambulatory Visit: Payer: Self-pay

## 2014-07-20 DIAGNOSIS — R42 Dizziness and giddiness: Secondary | ICD-10-CM

## 2014-07-20 MED ORDER — LISINOPRIL 40 MG PO TABS: 40.0000 mg | ORAL_TABLET | Freq: Every day | ORAL | Status: DC

## 2014-07-20 NOTE — Telephone Encounter (Signed)
Per note  6.3.15

## 2014-08-14 ENCOUNTER — Telehealth: Payer: Self-pay | Admitting: Cardiology

## 2014-08-14 ENCOUNTER — Telehealth: Payer: Self-pay | Admitting: *Deleted

## 2014-08-14 NOTE — Telephone Encounter (Signed)
Mrs. Torregrossa is calling because Mr. Brandon Joyce went to the E/D on yesterday because his bp dropped and they took him off of his Atenolol and told to come and see Dr. Stanford Breed either today or tomorrow. Wants to know what to do about his bp .Marland Kitchen Please call     Thanks

## 2014-08-14 NOTE — Telephone Encounter (Signed)
Spoke to patient - reports he had BP, HR drop Friday at work. Felt like "awful feeling" came over him. No other specific symptoms. Was seen by occupational health nurse at work, was sent home. He reported to ED at [facility]) yesterday d/t remaining concerns, low BP.  Lowest recorded BP was 100/48, HR 45. Pt was d/c'ed off atenolol, instr to follow up w/ cardiologist asap.  Noted at discharge yesterday from ED, BP of 158/91 recorded.  Wife wanted to make sure he was seen - informed them I would route to Dr. Stanford Breed, Dr. Claiborne Billings (DoD) on best advice, if pt needs to be seen by flex provider, etc. Advised to continue to check BPs twice daily, keep log - report any new concerns. Understanding verbalized.

## 2014-08-14 NOTE — Telephone Encounter (Signed)
error 

## 2014-08-14 NOTE — Telephone Encounter (Signed)
Pt seen at [facility] 08/13/14 for Hypotension.  Pt was told to stop Atenolol due to it being a Beta-Blocker.  Pt was advised to see his PCP for f/u.  Pt does not have a PCP other than Dr. Johnnye Sima.  [facility] does not have an available appointment that the pt can come to due to his work schedule until 08/29/14.   Pt does have a cardiologist who he also sees.  RN advised the pt's wife to contact the cardiologist regarding an earlier appt as well.  Pt's wife stated that she would call the cardiologist's office to see whether they would move the pt's appt to be seen sooner.   Pt's appt at that office is 09/20/14.  Appointment was made with Dr. Baxter Flattery for 08/29/14 in case the pt cannot be seen by cardiologist.

## 2014-08-14 NOTE — Telephone Encounter (Signed)
Continue off atenolol, follow hr and bp, paov [employer]

## 2014-08-15 NOTE — Telephone Encounter (Signed)
Spoke with pt wife, Aware of dr Jacalyn Lefevre recommendations.  Follow up scheduled

## 2014-08-21 NOTE — Telephone Encounter (Signed)
Thanks This needs to be managed by his cardiologist

## 2014-08-23 ENCOUNTER — Encounter: Payer: Self-pay | Admitting: Cardiology

## 2014-08-23 ENCOUNTER — Ambulatory Visit (INDEPENDENT_AMBULATORY_CARE_PROVIDER_SITE_OTHER): Payer: BLUE CROSS/BLUE SHIELD | Admitting: Cardiology

## 2014-08-23 ENCOUNTER — Other Ambulatory Visit: Payer: Self-pay | Admitting: Infectious Diseases

## 2014-08-23 VITALS — BP 144/84 | HR 73 | Ht 67.0 in | Wt 176.0 lb

## 2014-08-23 DIAGNOSIS — E785 Hyperlipidemia, unspecified: Secondary | ICD-10-CM

## 2014-08-23 DIAGNOSIS — I10 Essential (primary) hypertension: Secondary | ICD-10-CM | POA: Diagnosis not present

## 2014-08-23 DIAGNOSIS — G4700 Insomnia, unspecified: Secondary | ICD-10-CM

## 2014-08-23 NOTE — Assessment & Plan Note (Addendum)
Patient had recent bradycardia and hypotension and his atenolol was discontinued. His blood pressure has now improved. I have asked him to continue lisinopril and remain off of atenolol. He will follow his blood pressure at home and we will make further adjustments based on follow-up readings.

## 2014-08-23 NOTE — Patient Instructions (Signed)
Your physician wants you to follow-up in: 6 MONTHS WITH DR CRENSHAW You will receive a reminder letter in the mail two months in advance. If you don't receive a letter, please call our office to schedule the follow-up appointment.  

## 2014-08-23 NOTE — Progress Notes (Signed)
      HPI: FU hypertension. Echocardiogram in May 2015 showed normal LV function. Nuclear study May of 2015 was normal with ejection fraction 59%. Cardiac catheterization June 2015 showed minimal nonobstructive coronary disease and normal LV function. Patient approximately 2 weeks ago developed some weakness and dizziness at work. His blood pressure and pulse were checked and apparently were low. He was seen 3 days later at [facility] and his atenolol was discontinued. His symptoms have now improved. He denies dyspnea, chest pain or syncope.  Current Outpatient Prescriptions  Medication Sig Dispense Refill  . aspirin 81 MG tablet Take 2 tablets (162 mg total) by mouth daily. 90 tablet 3  . atorvastatin (LIPITOR) 20 MG tablet Take 1 tablet (20 mg total) by mouth daily. 30 tablet 5  . ATRIPLA 600-200-300 MG per tablet TAKE ONE TABLET BY MOUTH AT BEDTIME 90 tablet 1  . lisinopril (PRINIVIL,ZESTRIL) 40 MG tablet Take 1 tablet (40 mg total) by mouth daily. 90 tablet 0  . LORazepam (ATIVAN) 0.5 MG tablet TAKE ONE TABLET BY MOUTH THREE TIMES DAILY AS NEEDED BETWEEN  MEALS  AND  BEDTIME  FOR  ANXIETY  OR  SLEEP. 60 tablet 2   No current facility-administered medications for this visit.     Past Medical History  Diagnosis Date  . HIV disease   . HTN (hypertension)   . HLD (hyperlipidemia)   . Alcoholism   . ED (erectile dysfunction)   . Pneumonia     Past Surgical History  Procedure Laterality Date  . Tonsillectomy    . Back surgery    . Left heart catheterization with coronary angiogram N/A 07/28/2013    Procedure: LEFT HEART CATHETERIZATION WITH CORONARY ANGIOGRAM;  Surgeon: Jettie Booze, MD;  Location: [facility] CATH LAB;  Service: Cardiovascular;  Laterality: N/A;    History   Social History  . Marital Status: Married    Spouse Name: N/A  . Number of Children: 3  . Years of Education: N/A   Occupational History  .  [employer]   Social History Main Topics  .  Smoking status: Never Smoker   . Smokeless tobacco: Never Used  . Alcohol Use: 10.5 oz/week    21 drink(s) per week     Comment: beer  . Drug Use: No  . Sexual Activity:    Partners: Female    Museum/gallery curator: Condom     Comment: accepted condoms today   Other Topics Concern  . Not on file   Social History Narrative    ROS: no fevers or chills, productive cough, hemoptysis, dysphasia, odynophagia, melena, hematochezia, dysuria, hematuria, rash, seizure activity, orthopnea, PND, pedal edema, claudication. Remaining systems are negative.  Physical Exam: Well-developed well-nourished in no acute distress.  Skin is warm and dry.  HEENT is normal.  Neck is supple.  Chest is clear to auscultation with normal expansion.  Cardiovascular exam is regular rate and rhythm.  Abdominal exam nontender or distended. No masses palpated. Extremities show no edema. neuro grossly intact  ECG sinus rhythm at a rate of 73. Nonspecific ST changes.

## 2014-08-23 NOTE — Assessment & Plan Note (Signed)
Management per primary care. 

## 2014-08-24 MED ORDER — LORAZEPAM 0.5 MG PO TABS
ORAL_TABLET | ORAL | Status: DC
Start: 2014-08-24 — End: 2015-03-22

## 2014-08-24 NOTE — Telephone Encounter (Signed)
MD please supply approval and # of refills for this medication.

## 2014-08-24 NOTE — Addendum Note (Signed)
Addended by: Langston Summerfield C on: 08/24/2014 03:32 PM   Modules accepted: Orders

## 2014-08-29 ENCOUNTER — Ambulatory Visit: Payer: Self-pay | Admitting: Internal Medicine

## 2014-09-20 ENCOUNTER — Ambulatory Visit: Payer: Self-pay | Admitting: Cardiology

## 2014-10-09 ENCOUNTER — Other Ambulatory Visit: Payer: Self-pay

## 2014-10-28 ENCOUNTER — Other Ambulatory Visit: Payer: Self-pay | Admitting: Cardiology

## 2014-11-13 ENCOUNTER — Other Ambulatory Visit: Payer: BLUE CROSS/BLUE SHIELD

## 2014-11-13 DIAGNOSIS — B20 Human immunodeficiency virus [HIV] disease: Secondary | ICD-10-CM

## 2014-11-14 LAB — T-HELPER CELL (CD4) - (RCID CLINIC ONLY)
CD4 % Helper T Cell: 39 % (ref 33–55)
CD4 T Cell Abs: 1010 /uL (ref 400–2700)

## 2014-11-15 LAB — HIV-1 RNA QUANT-NO REFLEX-BLD

## 2014-11-27 ENCOUNTER — Encounter: Payer: Self-pay | Admitting: Infectious Diseases

## 2014-11-27 ENCOUNTER — Ambulatory Visit (INDEPENDENT_AMBULATORY_CARE_PROVIDER_SITE_OTHER): Payer: BLUE CROSS/BLUE SHIELD | Admitting: Infectious Diseases

## 2014-11-27 VITALS — BP 159/99 | HR 60 | Temp 97.8°F | Wt 186.0 lb

## 2014-11-27 DIAGNOSIS — B20 Human immunodeficiency virus [HIV] disease: Secondary | ICD-10-CM | POA: Diagnosis not present

## 2014-11-27 DIAGNOSIS — N486 Induration penis plastica: Secondary | ICD-10-CM

## 2014-11-27 DIAGNOSIS — Z23 Encounter for immunization: Secondary | ICD-10-CM

## 2014-11-27 DIAGNOSIS — I10 Essential (primary) hypertension: Secondary | ICD-10-CM

## 2014-11-27 DIAGNOSIS — Z113 Encounter for screening for infections with a predominantly sexual mode of transmission: Secondary | ICD-10-CM

## 2014-11-27 DIAGNOSIS — Z79899 Other long term (current) drug therapy: Secondary | ICD-10-CM

## 2014-11-27 NOTE — Progress Notes (Signed)
   Subjective:    Patient ID: Brandon Joyce, male    DOB: 1950-11-17, 64 y.o.   MRN: [id number]  HPI 64 yo M with HIV+, statin related myalgias, hyperlipidemia, erectile dysfunction, hypertension, ETOH use.  He had cardiac cath 07-2013 that showed non-obstr disease. He missed his last CV appt.  Currently taking atripla.  Has lost 3 family members since July.  Other family members with health issues as well.  Has not been back to CV yet, not sure when his f/u is. Had episode of being pale, hypotension while at work. Was monitored in ED.   HIV 1 RNA QUANT (copies/mL)  Date Value  11/13/2014 <20  04/06/2014 <20  09/28/2013 <20   CD4 T CELL ABS (/uL)  Date Value  11/13/2014 1010  04/06/2014 410  09/28/2013 620     Review of Systems  Constitutional: Positive for fatigue. Negative for appetite change and unexpected weight change.  Respiratory: Positive for shortness of breath.   Cardiovascular: Negative for chest pain.  Gastrointestinal: Negative for constipation and blood in stool.  Genitourinary: Negative for difficulty urinating.  has DOE.      Objective:   Physical Exam  Constitutional: He appears well-developed and well-nourished.  HENT:  Mouth/Throat: No oropharyngeal exudate.  Eyes: EOM are normal. Pupils are equal, round, and reactive to light.  Neck: Neck supple.  Cardiovascular: Normal rate, regular rhythm and normal heart sounds.   Pulmonary/Chest: Effort normal and breath sounds normal.  Abdominal: Soft. Bowel sounds are normal. There is no tenderness. There is no rebound.  Musculoskeletal: He exhibits no edema.  Lymphadenopathy:    He has no cervical adenopathy.       Assessment & Plan:

## 2014-11-27 NOTE — Assessment & Plan Note (Signed)
Have asked him to f/u with his CV MD

## 2014-11-27 NOTE — Assessment & Plan Note (Signed)
He is doing well He has condoms Gets Hep B #3 Gets flu shot.  rtc 6 months

## 2014-11-27 NOTE — Assessment & Plan Note (Signed)
Offered to send him back to uro.

## 2014-12-27 ENCOUNTER — Other Ambulatory Visit: Payer: Self-pay | Admitting: Infectious Diseases

## 2015-01-29 ENCOUNTER — Other Ambulatory Visit: Payer: Self-pay | Admitting: Cardiology

## 2015-01-29 ENCOUNTER — Other Ambulatory Visit: Payer: Self-pay | Admitting: Infectious Diseases

## 2015-01-29 DIAGNOSIS — B20 Human immunodeficiency virus [HIV] disease: Secondary | ICD-10-CM

## 2015-01-29 NOTE — Telephone Encounter (Signed)
REFILL 

## 2015-02-14 ENCOUNTER — Encounter: Payer: Self-pay | Admitting: *Deleted

## 2015-02-27 NOTE — Progress Notes (Signed)
      HPI: FU hypertension. Echocardiogram in May 2015 showed normal LV function. Nuclear study May of 2015 was normal with ejection fraction 59%. Cardiac catheterization June 2015 showed minimal nonobstructive coronary disease and normal LV function. Had atenolol DCed previously due to bradycardia. Since last seen, the patient denies any dyspnea on exertion, orthopnea, PND, pedal edema, palpitations, syncope or chest pain.   Current Outpatient Prescriptions  Medication Sig Dispense Refill  . aspirin 81 MG tablet Take 2 tablets (162 mg total) by mouth daily. 90 tablet 3  . atorvastatin (LIPITOR) 20 MG tablet TAKE ONE TABLET BY MOUTH ONCE DAILY 30 tablet 6  . ATRIPLA 600-200-300 MG tablet TAKE ONE TABLET BY MOUTH ONCE DAILY AT BEDTIME 90 tablet 5  . lisinopril (PRINIVIL,ZESTRIL) 40 MG tablet TAKE ONE TABLET BY MOUTH ONCE DAILY 90 tablet 0  . LORazepam (ATIVAN) 0.5 MG tablet TAKE ONE TABLET BY MOUTH AT  BEDTIME  FOR  ANXIETY  OR  SLEEP AS NEEDED. 30 tablet 1   No current facility-administered medications for this visit.     Past Medical History  Diagnosis Date  . HIV disease ([place])   . HTN (hypertension)   . HLD (hyperlipidemia)   . Alcoholism ([place])   . ED (erectile dysfunction)   . Pneumonia     Past Surgical History  Procedure Laterality Date  . Tonsillectomy    . Back surgery    . Left heart catheterization with coronary angiogram N/A 07/28/2013    Procedure: LEFT HEART CATHETERIZATION WITH CORONARY ANGIOGRAM;  Surgeon: Jettie Booze, MD;  Location: [facility] CATH LAB;  Service: Cardiovascular;  Laterality: N/A;    Social History   Social History  . Marital Status: Married    Spouse Name: N/A  . Number of Children: 3  . Years of Education: N/A   Occupational History  .  [employer]   Social History Main Topics  . Smoking status: Never Smoker   . Smokeless tobacco: Never Used  . Alcohol Use: 10.5 oz/week    21 drink(s) per week     Comment: beer  . Drug Use:  No  . Sexual Activity:    Partners: Female    Museum/gallery curator: Condom     Comment: accepted condoms today   Other Topics Concern  . Not on file   Social History Narrative    ROS: no fevers or chills, productive cough, hemoptysis, dysphasia, odynophagia, melena, hematochezia, dysuria, hematuria, rash, seizure activity, orthopnea, PND, pedal edema, claudication. Remaining systems are negative.  Physical Exam: Well-developed well-nourished in no acute distress.  Skin is warm and dry.  HEENT is normal.  Neck is supple.  Chest is clear to auscultation with normal expansion.  Cardiovascular exam is regular rate and rhythm.  Abdominal exam nontender or distended. No masses palpated. Extremities show no edema. neuro grossly intact  ECG Sinus rhythm at a rate of 75. Inferior lateral ST depression.

## 2015-02-28 ENCOUNTER — Ambulatory Visit (INDEPENDENT_AMBULATORY_CARE_PROVIDER_SITE_OTHER): Payer: BLUE CROSS/BLUE SHIELD | Admitting: Cardiology

## 2015-02-28 ENCOUNTER — Encounter: Payer: Self-pay | Admitting: Cardiology

## 2015-02-28 VITALS — BP 142/82 | HR 75 | Ht 68.0 in | Wt 183.0 lb

## 2015-02-28 DIAGNOSIS — I10 Essential (primary) hypertension: Secondary | ICD-10-CM

## 2015-02-28 MED ORDER — ATORVASTATIN CALCIUM 20 MG PO TABS: 20.0000 mg | ORAL_TABLET | Freq: Every day | ORAL | Status: DC

## 2015-02-28 MED ORDER — LISINOPRIL 40 MG PO TABS: 40.0000 mg | ORAL_TABLET | Freq: Every day | ORAL | Status: DC

## 2015-02-28 NOTE — Patient Instructions (Signed)
Medication Instructions:   NO CHANGE  Follow-Up:  Your physician wants you to follow-up in: ONE YEAR WITH DR CRENSHAW You will receive a reminder letter in the mail two months in advance. If you don't receive a letter, please call our office to schedule the follow-up appointment.   If you need a refill on your cardiac medications before your next appointment, please call your pharmacy.  

## 2015-02-28 NOTE — Assessment & Plan Note (Signed)
Blood pressure controlled. Continue present medications. 

## 2015-02-28 NOTE — Assessment & Plan Note (Signed)
Continue statin. 

## 2015-03-22 ENCOUNTER — Telehealth: Payer: Self-pay | Admitting: Infectious Diseases

## 2015-03-22 ENCOUNTER — Other Ambulatory Visit: Payer: Self-pay | Admitting: *Deleted

## 2015-03-22 DIAGNOSIS — G4700 Insomnia, unspecified: Secondary | ICD-10-CM

## 2015-03-22 MED ORDER — LORAZEPAM 0.5 MG PO TABS: ORAL_TABLET | ORAL | Status: DC

## 2015-03-22 NOTE — Telephone Encounter (Signed)
Dr. Hatcher, please advise. 

## 2015-03-22 NOTE — Telephone Encounter (Signed)
Patient left a vm on the medication assistance line requesting a refill on lorazepam, says he hasn't been able to rest, father in law passed away and mother in law in the hospital. Patient goes to [facility] [address] [employer]

## 2015-03-22 NOTE — Telephone Encounter (Signed)
Ok to refill x 1 only  

## 2015-03-22 NOTE — Telephone Encounter (Signed)
Prescription called in. Patient notified

## 2015-04-25 DIAGNOSIS — M75121 Complete rotator cuff tear or rupture of right shoulder, not specified as traumatic: Secondary | ICD-10-CM | POA: Insufficient documentation

## 2015-04-26 ENCOUNTER — Telehealth: Payer: Self-pay | Admitting: Cardiology

## 2015-04-26 NOTE — Telephone Encounter (Signed)
Spoke with lynn, they did not send anything for surgical clearance until today. The anesthesiologist wanted clearance based on his ECG taken today. She has faxed the information over for the DOD to address. Will wait on fax

## 2015-04-26 NOTE — Telephone Encounter (Signed)
Chart reviewed by dr Martinique DOD, pt is low risk from a cardiac standpoint for surgery. Jeani Hawking made aware and this note will be faxed to [phone].

## 2015-04-26 NOTE — Telephone Encounter (Signed)
Checking to see if clearance was ready,surgery is tomorrow.

## 2015-04-27 HISTORY — PX: ROTATOR CUFF REPAIR: SHX139

## 2015-05-30 DIAGNOSIS — M25511 Pain in right shoulder: Secondary | ICD-10-CM | POA: Diagnosis not present

## 2015-05-30 DIAGNOSIS — M6281 Muscle weakness (generalized): Secondary | ICD-10-CM | POA: Diagnosis not present

## 2015-05-30 DIAGNOSIS — M25611 Stiffness of right shoulder, not elsewhere classified: Secondary | ICD-10-CM | POA: Diagnosis not present

## 2015-06-06 DIAGNOSIS — M25511 Pain in right shoulder: Secondary | ICD-10-CM | POA: Diagnosis not present

## 2015-06-06 DIAGNOSIS — M6281 Muscle weakness (generalized): Secondary | ICD-10-CM | POA: Diagnosis not present

## 2015-06-06 DIAGNOSIS — M25611 Stiffness of right shoulder, not elsewhere classified: Secondary | ICD-10-CM | POA: Diagnosis not present

## 2015-06-14 DIAGNOSIS — M6281 Muscle weakness (generalized): Secondary | ICD-10-CM | POA: Diagnosis not present

## 2015-06-14 DIAGNOSIS — M25611 Stiffness of right shoulder, not elsewhere classified: Secondary | ICD-10-CM | POA: Diagnosis not present

## 2015-06-14 DIAGNOSIS — M25511 Pain in right shoulder: Secondary | ICD-10-CM | POA: Diagnosis not present

## 2015-06-22 DIAGNOSIS — M25611 Stiffness of right shoulder, not elsewhere classified: Secondary | ICD-10-CM | POA: Diagnosis not present

## 2015-06-22 DIAGNOSIS — M6281 Muscle weakness (generalized): Secondary | ICD-10-CM | POA: Diagnosis not present

## 2015-06-22 DIAGNOSIS — M25511 Pain in right shoulder: Secondary | ICD-10-CM | POA: Diagnosis not present

## 2015-07-02 ENCOUNTER — Other Ambulatory Visit: Payer: BLUE CROSS/BLUE SHIELD

## 2015-07-02 DIAGNOSIS — Z79899 Other long term (current) drug therapy: Secondary | ICD-10-CM

## 2015-07-02 DIAGNOSIS — B20 Human immunodeficiency virus [HIV] disease: Secondary | ICD-10-CM | POA: Diagnosis not present

## 2015-07-02 DIAGNOSIS — Z113 Encounter for screening for infections with a predominantly sexual mode of transmission: Secondary | ICD-10-CM

## 2015-07-02 LAB — LIPID PANEL
Cholesterol: 151 mg/dL (ref 125–200)
HDL: 39 mg/dL — ABNORMAL LOW (ref >=40)
LDL CALC: 85 mg/dL (ref <130)
TRIGLYCERIDES: 135 mg/dL (ref <150)
Total CHOL/HDL Ratio: 3.9 Ratio (ref <=5.0)
VLDL: 27 mg/dL (ref <30)

## 2015-07-02 LAB — COMPREHENSIVE METABOLIC PANEL
ALBUMIN: 4.4 g/dL (ref 3.6–5.1)
ALK PHOS: 74 U/L (ref 40–115)
ALT: 41 U/L (ref 9–46)
AST: 24 U/L (ref 10–35)
BILIRUBIN TOTAL: 0.4 mg/dL (ref 0.2–1.2)
BUN: 12 mg/dL (ref 7–25)
CO2: 21 mmol/L (ref 20–31)
CREATININE: 0.97 mg/dL (ref 0.70–1.25)
Calcium: 9.2 mg/dL (ref 8.6–10.3)
Chloride: 105 mmol/L (ref 98–110)
GLUCOSE: 98 mg/dL (ref 65–99)
Potassium: 4.5 mmol/L (ref 3.5–5.3)
SODIUM: 134 mmol/L — AB (ref 135–146)
Total Protein: 6.6 g/dL (ref 6.1–8.1)

## 2015-07-02 LAB — CBC
HCT: 44.8 % (ref 38.5–50.0)
HEMOGLOBIN: 15.3 g/dL (ref 13.2–17.1)
MCH: 29.8 pg (ref 27.0–33.0)
MCHC: 34.2 g/dL (ref 32.0–36.0)
MCV: 87.3 fL (ref 80.0–100.0)
MPV: 9.1 fL (ref 7.5–12.5)
Platelets: 192 10*3/uL (ref 140–400)
RBC: 5.13 MIL/uL (ref 4.20–5.80)
RDW: 14.6 % (ref 11.0–15.0)
WBC: 4.2 10*3/uL (ref 3.8–10.8)

## 2015-07-03 LAB — T-HELPER CELL (CD4) - (RCID CLINIC ONLY)
CD4 T CELL HELPER: 32 % — AB (ref 33–55)
CD4 T Cell Abs: 420 /uL (ref 400–2700)

## 2015-07-03 LAB — HIV-1 RNA QUANT-NO REFLEX-BLD
HIV 1 RNA Quant: <20 copies/mL (ref <20)
HIV-1 RNA Quant, Log: <1.3 Log copies/mL (ref <1.30)

## 2015-07-03 LAB — RPR

## 2015-07-12 DIAGNOSIS — M25611 Stiffness of right shoulder, not elsewhere classified: Secondary | ICD-10-CM | POA: Diagnosis not present

## 2015-07-12 DIAGNOSIS — M6281 Muscle weakness (generalized): Secondary | ICD-10-CM | POA: Diagnosis not present

## 2015-07-12 DIAGNOSIS — M25511 Pain in right shoulder: Secondary | ICD-10-CM | POA: Diagnosis not present

## 2015-07-16 ENCOUNTER — Other Ambulatory Visit: Payer: Self-pay | Admitting: Infectious Diseases

## 2015-07-16 ENCOUNTER — Ambulatory Visit (INDEPENDENT_AMBULATORY_CARE_PROVIDER_SITE_OTHER): Payer: BLUE CROSS/BLUE SHIELD | Admitting: Infectious Diseases

## 2015-07-16 ENCOUNTER — Encounter: Payer: Self-pay | Admitting: Infectious Diseases

## 2015-07-16 VITALS — BP 136/74 | HR 83 | Temp 98.4°F | Wt 187.0 lb

## 2015-07-16 DIAGNOSIS — B20 Human immunodeficiency virus [HIV] disease: Secondary | ICD-10-CM

## 2015-07-16 DIAGNOSIS — M25511 Pain in right shoulder: Secondary | ICD-10-CM | POA: Diagnosis not present

## 2015-07-16 DIAGNOSIS — E785 Hyperlipidemia, unspecified: Secondary | ICD-10-CM

## 2015-07-16 DIAGNOSIS — M6281 Muscle weakness (generalized): Secondary | ICD-10-CM | POA: Diagnosis not present

## 2015-07-16 DIAGNOSIS — I10 Essential (primary) hypertension: Secondary | ICD-10-CM | POA: Diagnosis not present

## 2015-07-16 DIAGNOSIS — F1020 Alcohol dependence, uncomplicated: Secondary | ICD-10-CM

## 2015-07-16 DIAGNOSIS — M25611 Stiffness of right shoulder, not elsewhere classified: Secondary | ICD-10-CM | POA: Diagnosis not present

## 2015-07-16 NOTE — Assessment & Plan Note (Signed)
Lab Results  Component Value Date   CHOL 151 07/02/2015   HDL 39* 07/02/2015   LDLCALC 85 07/02/2015   TRIG 135 07/02/2015   CHOLHDL 3.9 07/02/2015    Doing well

## 2015-07-16 NOTE — Assessment & Plan Note (Addendum)
He is doing well.  Offered to update his ART but he defers.   Had colon, can't remember date.  Will see him back in 6 months.

## 2015-07-16 NOTE — Assessment & Plan Note (Signed)
Well controlled today.

## 2015-07-16 NOTE — Progress Notes (Signed)
   Subjective:    Patient ID: Brandon Joyce, male    DOB: [birth year], 65 y.o.   MRN: [id number]  HPI 65 yo M with HIV+, statin related myalgias, hyperlipidemia, erectile dysfunction, hypertension, ETOH use.  He had cardiac cath 07-2013 that showed non-obstr disease. Had surgery on 04-27-15 shoulder due to reported work related injury.  Has had to go to court to get his benefits correct. Has made him depressed. This has in turn made him drink more.    HIV 1 RNA QUANT (copies/mL)  Date Value  07/02/2015 <20  11/13/2014 <20  04/06/2014 <20   CD4 T CELL ABS (/uL)  Date Value  07/02/2015 420  11/13/2014 1010  04/06/2014 410     Review of Systems  Constitutional: Negative for appetite change and unexpected weight change.  Cardiovascular: Negative for leg swelling.  Gastrointestinal: Negative for diarrhea and constipation.  Genitourinary: Negative for difficulty urinating.  Psychiatric/Behavioral: Positive for dysphoric mood. Negative for self-injury.  bored.  C/o tinitus     Objective:   Physical Exam  Constitutional: He appears well-developed and well-nourished.  HENT:  Mouth/Throat: No oropharyngeal exudate.  Eyes: EOM are normal. Pupils are equal, round, and reactive to light.  Neck: Neck supple.  Cardiovascular: Normal rate, regular rhythm and normal heart sounds.   Pulmonary/Chest: Effort normal and breath sounds normal.  Abdominal: Soft. Bowel sounds are normal. There is no rebound.  Musculoskeletal: He exhibits no edema.  Lymphadenopathy:    He has no cervical adenopathy.          Assessment & Plan:

## 2015-07-16 NOTE — Assessment & Plan Note (Signed)
Offered to have him seen by Leveda Anna, he declines.

## 2015-07-17 ENCOUNTER — Telehealth: Payer: Self-pay | Admitting: *Deleted

## 2015-07-17 ENCOUNTER — Telehealth: Payer: Self-pay | Admitting: Pharmacist Clinician (PhC)/ Clinical Pharmacy Specialist

## 2015-07-17 ENCOUNTER — Other Ambulatory Visit: Payer: Self-pay | Admitting: *Deleted

## 2015-07-17 DIAGNOSIS — G4700 Insomnia, unspecified: Secondary | ICD-10-CM

## 2015-07-17 MED ORDER — LORAZEPAM 0.5 MG PO TABS: ORAL_TABLET | ORAL | Status: DC

## 2015-07-17 NOTE — Telephone Encounter (Signed)
Ok to refill ativan 0.5mg  #30, no refill

## 2015-07-17 NOTE — Telephone Encounter (Signed)
Morgun called about if it's ok to take Alkaseltzer Plus with his meds. He is currently on lisinopril right now for HTN. Told him it was ok and it might increase his BP for the next couple of days.

## 2015-07-17 NOTE — Telephone Encounter (Signed)
Pt requesting refill. Please see the following and advise.  Last filled January 2017 with #30, no refills.  Landis Gandy, RN  Original authorizing provider: Bobby Rumpf, MD         Brandon Joyce would like a refill of the following medications:    LORazepam (ATIVAN) 0.5 MG tablet Bobby Rumpf, MD]        Preferred pharmacy: [facility] [place], [place] - [address]        Comment:    I need a new refill on my LOrazenpam 0.5 mg tablets   I use [facility] in [place], [place]  Thank you so much, as i forgot to tell you today when i had my checkup   Brandon Joyce

## 2015-07-17 NOTE — Telephone Encounter (Signed)
Called in refill. Thanks

## 2015-08-01 DIAGNOSIS — M25611 Stiffness of right shoulder, not elsewhere classified: Secondary | ICD-10-CM | POA: Diagnosis not present

## 2015-08-01 DIAGNOSIS — M6281 Muscle weakness (generalized): Secondary | ICD-10-CM | POA: Diagnosis not present

## 2015-08-01 DIAGNOSIS — M25511 Pain in right shoulder: Secondary | ICD-10-CM | POA: Diagnosis not present

## 2015-08-08 DIAGNOSIS — M6281 Muscle weakness (generalized): Secondary | ICD-10-CM | POA: Diagnosis not present

## 2015-08-08 DIAGNOSIS — M25511 Pain in right shoulder: Secondary | ICD-10-CM | POA: Diagnosis not present

## 2015-08-08 DIAGNOSIS — M25611 Stiffness of right shoulder, not elsewhere classified: Secondary | ICD-10-CM | POA: Diagnosis not present

## 2015-08-15 DIAGNOSIS — M6281 Muscle weakness (generalized): Secondary | ICD-10-CM | POA: Diagnosis not present

## 2015-08-15 DIAGNOSIS — M25511 Pain in right shoulder: Secondary | ICD-10-CM | POA: Diagnosis not present

## 2015-08-15 DIAGNOSIS — M25611 Stiffness of right shoulder, not elsewhere classified: Secondary | ICD-10-CM | POA: Diagnosis not present

## 2015-08-16 DIAGNOSIS — Z4789 Encounter for other orthopedic aftercare: Secondary | ICD-10-CM | POA: Diagnosis not present

## 2015-08-16 DIAGNOSIS — Z6827 Body mass index (BMI) 27.0-27.9, adult: Secondary | ICD-10-CM | POA: Diagnosis not present

## 2015-08-24 DIAGNOSIS — M25511 Pain in right shoulder: Secondary | ICD-10-CM | POA: Diagnosis not present

## 2015-08-24 DIAGNOSIS — M6281 Muscle weakness (generalized): Secondary | ICD-10-CM | POA: Diagnosis not present

## 2015-09-09 ENCOUNTER — Telehealth: Payer: Self-pay | Admitting: Infectious Diseases

## 2015-09-17 NOTE — Telephone Encounter (Signed)
Ok to refill 

## 2015-09-17 NOTE — Telephone Encounter (Signed)
Patient asking for refills of ativan.  Written January 2017 and May 2017 0.5 mg #30 no refills. Please advise.  Landis Gandy, RN

## 2015-09-18 ENCOUNTER — Other Ambulatory Visit: Payer: Self-pay | Admitting: Infectious Diseases

## 2015-09-18 DIAGNOSIS — G4700 Insomnia, unspecified: Secondary | ICD-10-CM

## 2015-09-18 MED ORDER — LORAZEPAM 0.5 MG PO TABS: ORAL_TABLET | ORAL | 0 refills | Status: DC

## 2015-09-18 NOTE — Telephone Encounter (Signed)
Called rx in.  Thanks!

## 2015-12-07 DIAGNOSIS — Z4789 Encounter for other orthopedic aftercare: Secondary | ICD-10-CM | POA: Diagnosis not present

## 2015-12-07 DIAGNOSIS — Z6827 Body mass index (BMI) 27.0-27.9, adult: Secondary | ICD-10-CM | POA: Diagnosis not present

## 2016-01-02 ENCOUNTER — Other Ambulatory Visit: Payer: Self-pay

## 2016-01-14 ENCOUNTER — Encounter: Payer: Self-pay | Admitting: Infectious Diseases

## 2016-01-16 ENCOUNTER — Ambulatory Visit: Payer: Self-pay | Admitting: Infectious Diseases

## 2016-01-21 ENCOUNTER — Other Ambulatory Visit: Payer: BLUE CROSS/BLUE SHIELD

## 2016-01-21 ENCOUNTER — Ambulatory Visit: Payer: BLUE CROSS/BLUE SHIELD

## 2016-01-21 DIAGNOSIS — B20 Human immunodeficiency virus [HIV] disease: Secondary | ICD-10-CM

## 2016-01-21 LAB — COMPREHENSIVE METABOLIC PANEL
ALK PHOS: 51 U/L (ref 40–115)
ALT: 31 U/L (ref 9–46)
AST: 24 U/L (ref 10–35)
Albumin: 4.5 g/dL (ref 3.6–5.1)
BUN: 20 mg/dL (ref 7–25)
CHLORIDE: 106 mmol/L (ref 98–110)
CO2: 24 mmol/L (ref 20–31)
Calcium: 9.3 mg/dL (ref 8.6–10.3)
Creat: 1.13 mg/dL (ref 0.70–1.25)
GLUCOSE: 89 mg/dL (ref 65–99)
POTASSIUM: 4.4 mmol/L (ref 3.5–5.3)
SODIUM: 137 mmol/L (ref 135–146)
Total Bilirubin: 0.3 mg/dL (ref 0.2–1.2)
Total Protein: 7.1 g/dL (ref 6.1–8.1)

## 2016-01-21 LAB — CBC
HCT: 44 % (ref 38.5–50.0)
Hemoglobin: 15.1 g/dL (ref 13.2–17.1)
MCH: 30.4 pg (ref 27.0–33.0)
MCHC: 34.3 g/dL (ref 32.0–36.0)
MCV: 88.5 fL (ref 80.0–100.0)
MPV: 9.3 fL (ref 7.5–12.5)
PLATELETS: 214 10*3/uL (ref 140–400)
RBC: 4.97 MIL/uL (ref 4.20–5.80)
RDW: 15.3 % — AB (ref 11.0–15.0)
WBC: 4.8 10*3/uL (ref 3.8–10.8)

## 2016-01-23 LAB — T-HELPER CELL (CD4) - (RCID CLINIC ONLY)
CD4 T CELL HELPER: 37 % (ref 33–55)
CD4 T Cell Abs: 430 /uL (ref 400–2700)

## 2016-01-23 LAB — HIV-1 RNA QUANT-NO REFLEX-BLD

## 2016-01-24 DIAGNOSIS — J0141 Acute recurrent pansinusitis: Secondary | ICD-10-CM | POA: Diagnosis not present

## 2016-02-07 ENCOUNTER — Other Ambulatory Visit: Payer: Self-pay | Admitting: Infectious Diseases

## 2016-02-07 DIAGNOSIS — B20 Human immunodeficiency virus [HIV] disease: Secondary | ICD-10-CM

## 2016-02-12 ENCOUNTER — Encounter: Payer: Self-pay | Admitting: Infectious Diseases

## 2016-02-12 ENCOUNTER — Ambulatory Visit (INDEPENDENT_AMBULATORY_CARE_PROVIDER_SITE_OTHER): Payer: BLUE CROSS/BLUE SHIELD | Admitting: Infectious Diseases

## 2016-02-12 VITALS — BP 145/89 | HR 65 | Temp 97.7°F | Ht 68.0 in | Wt 186.0 lb

## 2016-02-12 DIAGNOSIS — G4700 Insomnia, unspecified: Secondary | ICD-10-CM

## 2016-02-12 DIAGNOSIS — Z79899 Other long term (current) drug therapy: Secondary | ICD-10-CM

## 2016-02-12 DIAGNOSIS — F1020 Alcohol dependence, uncomplicated: Secondary | ICD-10-CM

## 2016-02-12 DIAGNOSIS — F5104 Psychophysiologic insomnia: Secondary | ICD-10-CM

## 2016-02-12 DIAGNOSIS — B20 Human immunodeficiency virus [HIV] disease: Secondary | ICD-10-CM

## 2016-02-12 DIAGNOSIS — Z113 Encounter for screening for infections with a predominantly sexual mode of transmission: Secondary | ICD-10-CM

## 2016-02-12 MED ORDER — LORAZEPAM 0.5 MG PO TABS: ORAL_TABLET | ORAL | 0 refills | Status: DC

## 2016-02-12 NOTE — Assessment & Plan Note (Addendum)
Drinking more lately due to work stress.  2-3 margarittas after work. Qoday.  Offered him appt with Grayland Ormond- he would do after holidays.  We discussed very clearly that he should not mix his benzo and ETOH.

## 2016-02-12 NOTE — Progress Notes (Signed)
   Subjective:    Patient ID: Brandon Joyce, male    DOB: Jan 31, 1951, 65 y.o.   MRN: [id number]  HPI 65 yo M with HIV+, statin related myalgias, hyperlipidemia, erectile dysfunction, hypertension, ETOH use.  He had cardiac cath 07-2013 that showed non-obstr disease. Had surgery on 04-27-15 shoulder due to reported work related injury. States his shoulder has been feeling better.  Has been doing well on atripla.  Tired from work, stressors.   HIV 1 RNA Quant (copies/mL)  Date Value  01/21/2016 <20  07/02/2015 <20  11/13/2014 <20   CD4 T Cell Abs (/uL)  Date Value  01/21/2016 430  07/02/2015 420  11/13/2014 1,010    Review of Systems  Constitutional: Positive for fatigue. Negative for activity change and unexpected weight change.  Gastrointestinal: Negative for constipation and diarrhea.  Genitourinary: Negative for difficulty urinating.  Psychiatric/Behavioral: Positive for sleep disturbance.      Objective:   Physical Exam  Constitutional: He appears well-developed and well-nourished.  HENT:  Mouth/Throat: No oropharyngeal exudate.  Eyes: EOM are normal. Pupils are equal, round, and reactive to light.  Neck: Neck supple.  Cardiovascular: Normal rate, regular rhythm and normal heart sounds.   Pulmonary/Chest: Effort normal and breath sounds normal.  Abdominal: Soft. Bowel sounds are normal. There is no tenderness. There is no rebound.  Musculoskeletal: He exhibits no edema.  Lymphadenopathy:    He has no cervical adenopathy.      Assessment & Plan:

## 2016-02-12 NOTE — Assessment & Plan Note (Signed)
He is doing well.  Flu vax at work.  Aged out of [employer].  Given condoms rtc in 6 months.

## 2016-02-12 NOTE — Assessment & Plan Note (Signed)
Will refill his ativan.

## 2016-02-19 ENCOUNTER — Other Ambulatory Visit: Payer: Self-pay | Admitting: Infectious Diseases

## 2016-02-19 DIAGNOSIS — G4700 Insomnia, unspecified: Secondary | ICD-10-CM

## 2016-03-10 ENCOUNTER — Other Ambulatory Visit: Payer: Self-pay | Admitting: Cardiology

## 2016-03-10 DIAGNOSIS — I10 Essential (primary) hypertension: Secondary | ICD-10-CM

## 2016-03-10 NOTE — Telephone Encounter (Signed)
Rx(s) sent to pharmacy electronically.  

## 2016-05-08 DIAGNOSIS — H01001 Unspecified blepharitis right upper eyelid: Secondary | ICD-10-CM | POA: Diagnosis not present

## 2016-05-08 DIAGNOSIS — H524 Presbyopia: Secondary | ICD-10-CM | POA: Diagnosis not present

## 2016-05-08 DIAGNOSIS — H25013 Cortical age-related cataract, bilateral: Secondary | ICD-10-CM | POA: Diagnosis not present

## 2016-05-08 DIAGNOSIS — H52203 Unspecified astigmatism, bilateral: Secondary | ICD-10-CM | POA: Diagnosis not present

## 2016-05-08 DIAGNOSIS — H5203 Hypermetropia, bilateral: Secondary | ICD-10-CM | POA: Diagnosis not present

## 2016-05-08 DIAGNOSIS — H2513 Age-related nuclear cataract, bilateral: Secondary | ICD-10-CM | POA: Diagnosis not present

## 2016-05-08 DIAGNOSIS — H01005 Unspecified blepharitis left lower eyelid: Secondary | ICD-10-CM | POA: Diagnosis not present

## 2016-06-10 ENCOUNTER — Other Ambulatory Visit: Payer: Self-pay | Admitting: Cardiology

## 2016-06-10 DIAGNOSIS — I10 Essential (primary) hypertension: Secondary | ICD-10-CM

## 2016-07-17 DIAGNOSIS — K5730 Diverticulosis of large intestine without perforation or abscess without bleeding: Secondary | ICD-10-CM | POA: Diagnosis not present

## 2016-07-17 DIAGNOSIS — D123 Benign neoplasm of transverse colon: Secondary | ICD-10-CM | POA: Diagnosis not present

## 2016-07-17 DIAGNOSIS — D124 Benign neoplasm of descending colon: Secondary | ICD-10-CM | POA: Diagnosis not present

## 2016-07-17 DIAGNOSIS — D122 Benign neoplasm of ascending colon: Secondary | ICD-10-CM | POA: Diagnosis not present

## 2016-07-17 DIAGNOSIS — Z800 Family history of malignant neoplasm of digestive organs: Secondary | ICD-10-CM | POA: Diagnosis not present

## 2016-07-17 DIAGNOSIS — Z1211 Encounter for screening for malignant neoplasm of colon: Secondary | ICD-10-CM | POA: Diagnosis not present

## 2016-07-17 DIAGNOSIS — D125 Benign neoplasm of sigmoid colon: Secondary | ICD-10-CM | POA: Diagnosis not present

## 2016-07-30 ENCOUNTER — Other Ambulatory Visit: Payer: BLUE CROSS/BLUE SHIELD

## 2016-07-30 DIAGNOSIS — Z79899 Other long term (current) drug therapy: Secondary | ICD-10-CM

## 2016-07-30 DIAGNOSIS — Z113 Encounter for screening for infections with a predominantly sexual mode of transmission: Secondary | ICD-10-CM

## 2016-07-30 DIAGNOSIS — B20 Human immunodeficiency virus [HIV] disease: Secondary | ICD-10-CM | POA: Diagnosis not present

## 2016-07-30 LAB — CBC
HCT: 42.5 % (ref 38.5–50.0)
Hemoglobin: 14.2 g/dL (ref 13.2–17.1)
MCH: 30.5 pg (ref 27.0–33.0)
MCHC: 33.4 g/dL (ref 32.0–36.0)
MCV: 91.4 fL (ref 80.0–100.0)
MPV: 9.1 fL (ref 7.5–12.5)
PLATELETS: 230 10*3/uL (ref 140–400)
RBC: 4.65 MIL/uL (ref 4.20–5.80)
RDW: 15 % (ref 11.0–15.0)
WBC: 4.7 10*3/uL (ref 3.8–10.8)

## 2016-07-31 LAB — COMPREHENSIVE METABOLIC PANEL
ALK PHOS: 58 U/L (ref 40–115)
ALT: 33 U/L (ref 9–46)
AST: 21 U/L (ref 10–35)
Albumin: 4.2 g/dL (ref 3.6–5.1)
BILIRUBIN TOTAL: 0.3 mg/dL (ref 0.2–1.2)
BUN: 18 mg/dL (ref 7–25)
CO2: 22 mmol/L (ref 20–31)
CREATININE: 1.3 mg/dL — AB (ref 0.70–1.25)
Calcium: 8.8 mg/dL (ref 8.6–10.3)
Chloride: 109 mmol/L (ref 98–110)
Glucose, Bld: 96 mg/dL (ref 65–99)
POTASSIUM: 4.1 mmol/L (ref 3.5–5.3)
Sodium: 142 mmol/L (ref 135–146)
TOTAL PROTEIN: 6.6 g/dL (ref 6.1–8.1)

## 2016-07-31 LAB — T-HELPER CELL (CD4) - (RCID CLINIC ONLY)
CD4 T CELL HELPER: 39 % (ref 33–55)
CD4 T Cell Abs: 520 /uL (ref 400–2700)

## 2016-07-31 LAB — LIPID PANEL
Cholesterol: 155 mg/dL (ref <200)
HDL: 54 mg/dL (ref >40)
LDL Cholesterol: 69 mg/dL (ref <100)
Total CHOL/HDL Ratio: 2.9 Ratio (ref <5.0)
Triglycerides: 161 mg/dL — ABNORMAL HIGH (ref <150)
VLDL: 32 mg/dL — ABNORMAL HIGH (ref <30)

## 2016-07-31 LAB — RPR

## 2016-08-01 LAB — HIV-1 RNA QUANT-NO REFLEX-BLD
HIV 1 RNA QUANT: NOT DETECTED {copies}/mL
HIV-1 RNA QUANT, LOG: NOT DETECTED {Log_copies}/mL

## 2016-08-13 ENCOUNTER — Ambulatory Visit: Payer: Self-pay | Admitting: Infectious Diseases

## 2016-08-15 ENCOUNTER — Other Ambulatory Visit: Payer: Self-pay

## 2016-08-15 ENCOUNTER — Telehealth: Payer: Self-pay | Admitting: Cardiology

## 2016-08-15 DIAGNOSIS — I10 Essential (primary) hypertension: Secondary | ICD-10-CM

## 2016-08-15 MED ORDER — ATORVASTATIN CALCIUM 20 MG PO TABS: 20.0000 mg | ORAL_TABLET | Freq: Every day | ORAL | 0 refills | Status: DC

## 2016-08-15 MED ORDER — LISINOPRIL 40 MG PO TABS: 40.0000 mg | ORAL_TABLET | Freq: Every day | ORAL | 0 refills | Status: DC

## 2016-08-15 NOTE — Telephone Encounter (Signed)
New message      *STAT* If patient is at the pharmacy, call can be transferred to refill team.   1. Which medications need to be refilled? (please list name of each medication and dose if known)  torvastatin (LIPITOR) 20 MG tablet TAKE ONE TABLET BY MOUTH ONCE DAILY *MAKE APPOINTMENT FOR REFILLS*   lisinopril (PRINIVIL,ZESTRIL) 40 MG tablet TAKE ONE TABLET BY MOUTH ONCE DAILY <PLEASE MAKE APPOINTMENT FOR REFILL>      2. Which pharmacy/location (including street and city if local pharmacy) is medication to be sent to? [facility] [place]  3. Do they need a 30 day or 90 day supply?  90  Pt can not get appt until 11/05/16

## 2016-09-30 ENCOUNTER — Other Ambulatory Visit: Payer: Self-pay | Admitting: Infectious Diseases

## 2016-09-30 DIAGNOSIS — G4700 Insomnia, unspecified: Secondary | ICD-10-CM

## 2016-10-02 MED ORDER — LORAZEPAM 0.5 MG PO TABS: ORAL_TABLET | ORAL | 0 refills | Status: DC

## 2016-10-13 NOTE — Telephone Encounter (Signed)
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  I'm not sure I can close this encounter.

## 2016-10-15 ENCOUNTER — Encounter: Payer: Self-pay | Admitting: Cardiology

## 2016-10-31 ENCOUNTER — Encounter: Payer: Self-pay | Admitting: Infectious Diseases

## 2016-10-31 NOTE — Progress Notes (Deleted)
      HPI: FU hypertension. Echocardiogram in May 2015 showed normal LV function. Nuclear study May of 2015 was normal with ejection fraction 59%. Cardiac catheterization June 2015 showed minimal nonobstructive coronary disease and normal LV function. Had atenolol DCed previously due to bradycardia. Since last seen,   Current Outpatient Prescriptions  Medication Sig Dispense Refill  . aspirin 81 MG tablet Take 2 tablets (162 mg total) by mouth daily. 90 tablet 3  . atorvastatin (LIPITOR) 20 MG tablet Take 1 tablet (20 mg total) by mouth daily. Please keep upcoming appointment 9/12 at 4:30 pm for additional refills thanks. 90 tablet 0  . ATRIPLA 600-200-300 MG tablet TAKE ONE TABLET BY MOUTH ONCE DAILY AT BEDTIME 90 tablet 2  . lisinopril (PRINIVIL,ZESTRIL) 40 MG tablet Take 1 tablet (40 mg total) by mouth daily. Please keep upcoming appointment 9/12 at 4:30 pm for additional refills thanks. 90 tablet 0  . LORazepam (ATIVAN) 0.5 MG tablet TAKE ONE TABLET BY MOUTH AT  BEDTIME  FOR  ANXIETY  OR  SLEEP AS NEEDED. 30 tablet 0   No current facility-administered medications for this visit.      Past Medical History:  Diagnosis Date  . Alcoholism ([place])   . ED (erectile dysfunction)   . HIV disease ([place])   . HLD (hyperlipidemia)   . HTN (hypertension)   . Pneumonia     Past Surgical History:  Procedure Laterality Date  . BACK SURGERY    . LEFT HEART CATHETERIZATION WITH CORONARY ANGIOGRAM N/A 07/28/2013   Procedure: LEFT HEART CATHETERIZATION WITH CORONARY ANGIOGRAM;  Surgeon: Jettie Booze, MD;  Location: [facility] CATH LAB;  Service: Cardiovascular;  Laterality: N/A;  . ROTATOR CUFF REPAIR Right 04-27-2015  . TONSILLECTOMY      Social History   Social History  . Marital status: Married    Spouse name: N/A  . Number of children: 3  . Years of education: N/A   Occupational History  .  [employer]   Social History Main Topics  . Smoking status: Never Smoker  . Smokeless  tobacco: Never Used  . Alcohol use 10.5 oz/week    21 drink(s) per week     Comment: beer  . Drug use: No  . Sexual activity: Not Currently    Partners: Female    Birth control/ protection: Condom     Comment: accepted condoms today   Other Topics Concern  . Not on file   Social History Narrative  . No narrative on file    Family History  Problem Relation Age of Onset  . Diverticulitis Mother   . Heart disease Mother   . Atrial fibrillation Mother   . Heart disease Father        CABG  . Colon cancer Brother     ROS: no fevers or chills, productive cough, hemoptysis, dysphasia, odynophagia, melena, hematochezia, dysuria, hematuria, rash, seizure activity, orthopnea, PND, pedal edema, claudication. Remaining systems are negative.  Physical Exam: Well-developed well-nourished in no acute distress.  Skin is warm and dry.  HEENT is normal.  Neck is supple.  Chest is clear to auscultation with normal expansion.  Cardiovascular exam is regular rate and rhythm.  Abdominal exam nontender or distended. No masses palpated. Extremities show no edema. neuro grossly intact  ECG- personally reviewed  A/P  1  Kirk Ruths, MD

## 2016-11-05 ENCOUNTER — Ambulatory Visit: Payer: Self-pay | Admitting: Cardiology

## 2016-11-11 DIAGNOSIS — N529 Male erectile dysfunction, unspecified: Secondary | ICD-10-CM | POA: Insufficient documentation

## 2016-11-11 DIAGNOSIS — N486 Induration penis plastica: Secondary | ICD-10-CM | POA: Diagnosis not present

## 2016-11-17 NOTE — Progress Notes (Signed)
HPI: FU hypertension. Echocardiogram in May 2015 showed normal LV function. Nuclear study May of 2015 was normal with ejection fraction 59%. Cardiac catheterization June 2015 showed minimal nonobstructive coronary disease and normal LV function. Had atenolol DCed previously due to bradycardia. Since last seen, the patient denies any dyspnea on exertion, orthopnea, PND, pedal edema, palpitations, syncope or chest pain.   Current Outpatient Prescriptions  Medication Sig Dispense Refill  . aspirin 81 MG tablet Take 2 tablets (162 mg total) by mouth daily. 90 tablet 3  . atorvastatin (LIPITOR) 20 MG tablet Take 1 tablet (20 mg total) by mouth daily. Please keep upcoming appointment 9/12 at 4:30 pm for additional refills thanks. 90 tablet 0  . ATRIPLA 600-200-300 MG tablet TAKE ONE TABLET BY MOUTH ONCE DAILY AT BEDTIME 90 tablet 2  . lisinopril (PRINIVIL,ZESTRIL) 40 MG tablet Take 1 tablet (40 mg total) by mouth daily. Please keep upcoming appointment 9/12 at 4:30 pm for additional refills thanks. 90 tablet 0  . LORazepam (ATIVAN) 0.5 MG tablet TAKE ONE TABLET BY MOUTH AT  BEDTIME  FOR  ANXIETY  OR  SLEEP AS NEEDED. 30 tablet 0   No current facility-administered medications for this visit.      Past Medical History:  Diagnosis Date  . Alcoholism ([place])   . ED (erectile dysfunction)   . HIV disease ([place])   . HLD (hyperlipidemia)   . HTN (hypertension)   . Pneumonia     Past Surgical History:  Procedure Laterality Date  . BACK SURGERY    . LEFT HEART CATHETERIZATION WITH CORONARY ANGIOGRAM N/A 07/28/2013   Procedure: LEFT HEART CATHETERIZATION WITH CORONARY ANGIOGRAM;  Surgeon: Jettie Booze, MD;  Location: [facility] CATH LAB;  Service: Cardiovascular;  Laterality: N/A;  . ROTATOR CUFF REPAIR Right 04-27-2015  . TONSILLECTOMY      Social History   Social History  . Marital status: Married    Spouse name: N/A  . Number of children: 3  . Years of education: N/A   Occupational  History  .  [employer]   Social History Main Topics  . Smoking status: Never Smoker  . Smokeless tobacco: Never Used  . Alcohol use 10.5 oz/week    21 drink(s) per week     Comment: beer  . Drug use: No  . Sexual activity: Not Currently    Partners: Female    Birth control/ protection: Condom     Comment: accepted condoms today   Other Topics Concern  . Not on file   Social History Narrative  . No narrative on file    Family History  Problem Relation Age of Onset  . Diverticulitis Mother   . Heart disease Mother   . Atrial fibrillation Mother   . Heart disease Father        CABG  . Colon cancer Brother     ROS: no fevers or chills, productive cough, hemoptysis, dysphasia, odynophagia, melena, hematochezia, dysuria, hematuria, rash, seizure activity, orthopnea, PND, pedal edema, claudication. Remaining systems are negative.  Physical Exam: Well-developed well-nourished in no acute distress.  Skin is warm and dry.  HEENT is normal.  Neck is supple.  Chest is clear to auscultation with normal expansion.  Cardiovascular exam is regular rate and rhythm.  Abdominal exam nontender or distended. No masses palpated. Extremities show no edema. neuro grossly intact  ECG- NSR, cannot R/O prior septal MI; nonspecific ST changes; personally reviewed  A/P  1 Hypertension-BP controlled; continue  present meds.  2 Hyperlipidemia-continue statin; lipids and liver monitored by primary care.  Kirk Ruths, MD

## 2016-11-18 ENCOUNTER — Other Ambulatory Visit: Payer: Self-pay

## 2016-11-18 ENCOUNTER — Encounter: Payer: Self-pay | Admitting: Infectious Diseases

## 2016-11-18 ENCOUNTER — Other Ambulatory Visit: Payer: Self-pay | Admitting: Infectious Diseases

## 2016-11-18 DIAGNOSIS — I10 Essential (primary) hypertension: Secondary | ICD-10-CM

## 2016-11-18 DIAGNOSIS — B20 Human immunodeficiency virus [HIV] disease: Secondary | ICD-10-CM

## 2016-11-18 MED ORDER — ATORVASTATIN CALCIUM 20 MG PO TABS: 20.0000 mg | ORAL_TABLET | Freq: Every day | ORAL | 0 refills | Status: DC

## 2016-11-18 MED ORDER — LISINOPRIL 40 MG PO TABS: 40.0000 mg | ORAL_TABLET | Freq: Every day | ORAL | 0 refills | Status: DC

## 2016-11-19 ENCOUNTER — Encounter: Payer: Self-pay | Admitting: Cardiology

## 2016-11-19 ENCOUNTER — Ambulatory Visit (INDEPENDENT_AMBULATORY_CARE_PROVIDER_SITE_OTHER): Payer: BLUE CROSS/BLUE SHIELD | Admitting: Cardiology

## 2016-11-19 VITALS — BP 130/85 | HR 73 | Ht 68.0 in | Wt 182.0 lb

## 2016-11-19 DIAGNOSIS — I10 Essential (primary) hypertension: Secondary | ICD-10-CM | POA: Diagnosis not present

## 2016-11-19 DIAGNOSIS — E7800 Pure hypercholesterolemia, unspecified: Secondary | ICD-10-CM | POA: Diagnosis not present

## 2016-11-19 MED ORDER — LISINOPRIL 40 MG PO TABS: 40.0000 mg | ORAL_TABLET | Freq: Every day | ORAL | 3 refills | Status: DC

## 2016-11-19 MED ORDER — ATORVASTATIN CALCIUM 20 MG PO TABS: 20.0000 mg | ORAL_TABLET | Freq: Every day | ORAL | 3 refills | Status: DC

## 2016-11-19 NOTE — Patient Instructions (Signed)
Your physician wants you to follow-up in: ONE YEAR WITH DR CRENSHAW You will receive a reminder letter in the mail two months in advance. If you don't receive a letter, please call our office to schedule the follow-up appointment.   If you need a refill on your cardiac medications before your next appointment, please call your pharmacy.  

## 2016-11-21 ENCOUNTER — Other Ambulatory Visit: Payer: Self-pay | Admitting: *Deleted

## 2016-11-21 DIAGNOSIS — B20 Human immunodeficiency virus [HIV] disease: Secondary | ICD-10-CM

## 2016-11-21 MED ORDER — EFAVIRENZ-EMTRICITAB-TENOFOVIR 600-200-300 MG PO TABS: 1.0000 | ORAL_TABLET | Freq: Every day | ORAL | 0 refills | Status: DC

## 2016-11-24 ENCOUNTER — Ambulatory Visit: Payer: Self-pay | Admitting: Infectious Diseases

## 2016-11-26 DIAGNOSIS — N486 Induration penis plastica: Secondary | ICD-10-CM | POA: Diagnosis not present

## 2016-11-26 DIAGNOSIS — N529 Male erectile dysfunction, unspecified: Secondary | ICD-10-CM | POA: Diagnosis not present

## 2016-12-15 DIAGNOSIS — N529 Male erectile dysfunction, unspecified: Secondary | ICD-10-CM | POA: Diagnosis not present

## 2016-12-15 DIAGNOSIS — R5382 Chronic fatigue, unspecified: Secondary | ICD-10-CM | POA: Diagnosis not present

## 2016-12-16 ENCOUNTER — Other Ambulatory Visit: Payer: BLUE CROSS/BLUE SHIELD

## 2016-12-16 DIAGNOSIS — B20 Human immunodeficiency virus [HIV] disease: Secondary | ICD-10-CM | POA: Diagnosis not present

## 2016-12-17 LAB — CBC WITH DIFFERENTIAL/PLATELET
Basophils Absolute: 38 {cells}/uL (ref 0–200)
Basophils Relative: 0.8 %
Eosinophils Absolute: 268 {cells}/uL (ref 15–500)
Eosinophils Relative: 5.7 %
HCT: 42 % (ref 38.5–50.0)
Hemoglobin: 14.6 g/dL (ref 13.2–17.1)
Lymphs Abs: 1236 {cells}/uL (ref 850–3900)
MCH: 30.7 pg (ref 27.0–33.0)
MCHC: 34.8 g/dL (ref 32.0–36.0)
MCV: 88.2 fL (ref 80.0–100.0)
MPV: 9.6 fL (ref 7.5–12.5)
Monocytes Relative: 8.5 %
Neutro Abs: 2759 {cells}/uL (ref 1500–7800)
Neutrophils Relative %: 58.7 %
Platelets: 221 10*3/uL (ref 140–400)
RBC: 4.76 Million/uL (ref 4.20–5.80)
RDW: 14 % (ref 11.0–15.0)
Total Lymphocyte: 26.3 %
WBC mixed population: 400 {cells}/uL (ref 200–950)
WBC: 4.7 10*3/uL (ref 3.8–10.8)

## 2016-12-17 LAB — COMPREHENSIVE METABOLIC PANEL WITH GFR
AG Ratio: 1.9 (calc) (ref 1.0–2.5)
ALT: 36 U/L (ref 9–46)
AST: 25 U/L (ref 10–35)
Albumin: 4.5 g/dL (ref 3.6–5.1)
Alkaline phosphatase (APISO): 63 U/L (ref 40–115)
BUN: 16 mg/dL (ref 7–25)
CO2: 23 mmol/L (ref 20–32)
Calcium: 9.4 mg/dL (ref 8.6–10.3)
Chloride: 106 mmol/L (ref 98–110)
Creat: 0.96 mg/dL (ref 0.70–1.25)
Globulin: 2.4 g/dL (ref 1.9–3.7)
Glucose, Bld: 100 mg/dL — ABNORMAL HIGH (ref 65–99)
Potassium: 4.3 mmol/L (ref 3.5–5.3)
Sodium: 138 mmol/L (ref 135–146)
Total Bilirubin: 0.3 mg/dL (ref 0.2–1.2)
Total Protein: 6.9 g/dL (ref 6.1–8.1)

## 2016-12-17 LAB — T-HELPER CELLS (CD4) COUNT (NOT AT ARMC)
CD4 % Helper T Cell: 40 % (ref 33–55)
CD4 T Cell Abs: 540 /uL (ref 400–2700)

## 2016-12-18 LAB — HIV-1 RNA QUANT-NO REFLEX-BLD
HIV 1 RNA QUANT: NOT DETECTED {copies}/mL
HIV-1 RNA Quant, Log: <1.3 Log copies/mL

## 2016-12-24 ENCOUNTER — Ambulatory Visit: Payer: Self-pay | Admitting: Infectious Diseases

## 2016-12-29 ENCOUNTER — Encounter: Payer: Self-pay | Admitting: Infectious Diseases

## 2016-12-29 ENCOUNTER — Ambulatory Visit (INDEPENDENT_AMBULATORY_CARE_PROVIDER_SITE_OTHER): Payer: BLUE CROSS/BLUE SHIELD | Admitting: Infectious Diseases

## 2016-12-29 VITALS — BP 153/86 | HR 69 | Temp 98.7°F | Wt 187.0 lb

## 2016-12-29 DIAGNOSIS — I10 Essential (primary) hypertension: Secondary | ICD-10-CM

## 2016-12-29 DIAGNOSIS — Z79899 Other long term (current) drug therapy: Secondary | ICD-10-CM

## 2016-12-29 DIAGNOSIS — B20 Human immunodeficiency virus [HIV] disease: Secondary | ICD-10-CM | POA: Diagnosis not present

## 2016-12-29 DIAGNOSIS — Z113 Encounter for screening for infections with a predominantly sexual mode of transmission: Secondary | ICD-10-CM | POA: Diagnosis not present

## 2016-12-29 DIAGNOSIS — F528 Other sexual dysfunction not due to a substance or known physiological condition: Secondary | ICD-10-CM

## 2016-12-29 DIAGNOSIS — Z23 Encounter for immunization: Secondary | ICD-10-CM

## 2016-12-29 MED ORDER — BICTEGRAVIR-EMTRICITAB-TENOFOV 50-200-25 MG PO TABS: 1.0000 | ORAL_TABLET | Freq: Every day | ORAL | 3 refills | Status: DC

## 2016-12-29 NOTE — Assessment & Plan Note (Signed)
Will cont his ace-I.  Elevated today- pain?

## 2016-12-29 NOTE — Progress Notes (Signed)
   Subjective:    Patient ID: Brandon Joyce, male    DOB: 10/26/1950, 66 y.o.   MRN: [id number]  HPI 66 yo M with HIV+, statin related myalgias, hyperlipidemia, erectile dysfunction, hypertension, ETOH use.  He had cardiac cath 07-2013 that showed non-obstr disease. Had surgery on 04-27-15 shoulder due to reported work related injury. States his shoulder has been feeling better.  Has been doing well on atripla.   Still having issues with ED. Has not taken viagra for some time.  Wants to have testosterone checked.  Has had new work injury, seen by MD.   HIV 1 RNA Quant (copies/mL)  Date Value  12/16/2016 <20 NOT DETECTED  07/30/2016 <20 NOT DETECTED  01/21/2016 <20   CD4 T Cell Abs (/uL)  Date Value  12/16/2016 540  07/30/2016 520  01/21/2016 430    Review of Systems  Constitutional: Negative for appetite change, chills, fever and unexpected weight change.  Respiratory: Negative for cough and shortness of breath.   Cardiovascular: Negative for chest pain.  Gastrointestinal: Negative for constipation and diarrhea.  Genitourinary: Negative for difficulty urinating.  Neurological: Negative for headaches.  Psychiatric/Behavioral: Positive for sleep disturbance. Negative for dysphoric mood.  Please see HPI. All other systems reviewed and negative.      Objective:   Physical Exam  Constitutional: He appears well-developed and well-nourished.  HENT:  Mouth/Throat: No oropharyngeal exudate.  Eyes: EOM are normal. Pupils are equal, round, and reactive to light.  Neck: Neck supple.  Cardiovascular: Normal rate, regular rhythm and normal heart sounds.  Pulmonary/Chest: Effort normal and breath sounds normal.  Abdominal: Soft. Bowel sounds are normal. There is no tenderness. There is no rebound.  Musculoskeletal: He exhibits no edema.  Lymphadenopathy:    He has no cervical adenopathy.  Psychiatric: He has a normal mood and affect.          Assessment & Plan:

## 2016-12-29 NOTE — Addendum Note (Signed)
Addended by: Aundria Rud on: 12/29/2016 05:02 PM   Modules accepted: Orders

## 2016-12-29 NOTE — Assessment & Plan Note (Signed)
Will change him to biktarvy Offered/refused condoms.  Got flu shot at work.  Needs prevnar, shingrix, tdap.  rtc in 4 months.

## 2016-12-29 NOTE — Assessment & Plan Note (Signed)
Will check testo and PSA. viagra rx.

## 2016-12-30 LAB — TESTOSTERONE: TESTOSTERONE: 427 ng/dL (ref 250–827)

## 2016-12-30 LAB — PSA: PSA: 1 ng/mL (ref <=4.0)

## 2017-03-23 DIAGNOSIS — S161XXA Strain of muscle, fascia and tendon at neck level, initial encounter: Secondary | ICD-10-CM

## 2017-03-23 HISTORY — DX: Strain of muscle, fascia and tendon at neck level, initial encounter: S16.1XXA

## 2017-04-08 ENCOUNTER — Other Ambulatory Visit: Payer: BLUE CROSS/BLUE SHIELD

## 2017-04-08 DIAGNOSIS — Z113 Encounter for screening for infections with a predominantly sexual mode of transmission: Secondary | ICD-10-CM

## 2017-04-08 DIAGNOSIS — Z79899 Other long term (current) drug therapy: Secondary | ICD-10-CM | POA: Diagnosis not present

## 2017-04-08 DIAGNOSIS — B20 Human immunodeficiency virus [HIV] disease: Secondary | ICD-10-CM

## 2017-04-09 LAB — COMPLETE METABOLIC PANEL WITH GFR
AG RATIO: 1.8 (calc) (ref 1.0–2.5)
ALT: 36 U/L (ref 9–46)
AST: 25 U/L (ref 10–35)
Albumin: 4.4 g/dL (ref 3.6–5.1)
Alkaline phosphatase (APISO): 60 U/L (ref 40–115)
BUN/Creatinine Ratio: 16 (calc) (ref 6–22)
BUN: 21 mg/dL (ref 7–25)
CALCIUM: 9.2 mg/dL (ref 8.6–10.3)
CO2: 26 mmol/L (ref 20–32)
Chloride: 107 mmol/L (ref 98–110)
Creat: 1.33 mg/dL — ABNORMAL HIGH (ref 0.70–1.25)
GFR, EST NON AFRICAN AMERICAN: 55 mL/min/{1.73_m2} — AB (ref >=60)
GFR, Est African American: 64 mL/min/{1.73_m2} (ref >=60)
GLUCOSE: 101 mg/dL — AB (ref 65–99)
Globulin: 2.5 g/dL (calc) (ref 1.9–3.7)
POTASSIUM: 4.2 mmol/L (ref 3.5–5.3)
Sodium: 140 mmol/L (ref 135–146)
Total Bilirubin: 0.3 mg/dL (ref 0.2–1.2)
Total Protein: 6.9 g/dL (ref 6.1–8.1)

## 2017-04-09 LAB — CBC
HCT: 41.1 % (ref 38.5–50.0)
Hemoglobin: 14.6 g/dL (ref 13.2–17.1)
MCH: 31.1 pg (ref 27.0–33.0)
MCHC: 35.5 g/dL (ref 32.0–36.0)
MCV: 87.6 fL (ref 80.0–100.0)
MPV: 9.5 fL (ref 7.5–12.5)
PLATELETS: 228 10*3/uL (ref 140–400)
RBC: 4.69 10*6/uL (ref 4.20–5.80)
RDW: 13.5 % (ref 11.0–15.0)
WBC: 5.1 10*3/uL (ref 3.8–10.8)

## 2017-04-09 LAB — LIPID PANEL
CHOL/HDL RATIO: 3.9 (calc) (ref <5.0)
Cholesterol: 170 mg/dL (ref <200)
HDL: 44 mg/dL (ref >40)
LDL Cholesterol (Calc): 90 mg/dL (calc)
NON-HDL CHOLESTEROL (CALC): 126 mg/dL (ref <130)
Triglycerides: 275 mg/dL — ABNORMAL HIGH (ref <150)

## 2017-04-09 LAB — T-HELPER CELL (CD4) - (RCID CLINIC ONLY)
CD4 T CELL ABS: 490 /uL (ref 400–2700)
CD4 T CELL HELPER: 36 % (ref 33–55)

## 2017-04-09 LAB — RPR: RPR Ser Ql: NONREACTIVE

## 2017-04-10 LAB — HIV-1 RNA QUANT-NO REFLEX-BLD
HIV 1 RNA Quant: <20 copies/mL
HIV-1 RNA QUANT, LOG: NOT DETECTED {Log_copies}/mL

## 2017-04-18 ENCOUNTER — Encounter: Payer: Self-pay | Admitting: Infectious Diseases

## 2017-04-22 ENCOUNTER — Ambulatory Visit (INDEPENDENT_AMBULATORY_CARE_PROVIDER_SITE_OTHER): Payer: BLUE CROSS/BLUE SHIELD | Admitting: Infectious Diseases

## 2017-04-22 ENCOUNTER — Encounter: Payer: Self-pay | Admitting: Infectious Diseases

## 2017-04-22 VITALS — BP 146/93 | HR 69 | Temp 98.2°F | Wt 190.0 lb

## 2017-04-22 DIAGNOSIS — E7800 Pure hypercholesterolemia, unspecified: Secondary | ICD-10-CM

## 2017-04-22 DIAGNOSIS — B20 Human immunodeficiency virus [HIV] disease: Secondary | ICD-10-CM

## 2017-04-22 DIAGNOSIS — I10 Essential (primary) hypertension: Secondary | ICD-10-CM | POA: Diagnosis not present

## 2017-04-22 DIAGNOSIS — N486 Induration penis plastica: Secondary | ICD-10-CM

## 2017-04-22 DIAGNOSIS — Z113 Encounter for screening for infections with a predominantly sexual mode of transmission: Secondary | ICD-10-CM | POA: Diagnosis not present

## 2017-04-22 DIAGNOSIS — F1020 Alcohol dependence, uncomplicated: Secondary | ICD-10-CM

## 2017-04-22 NOTE — Assessment & Plan Note (Signed)
He is asx Could consider changing to ACE-HCTZ if stays elevated.

## 2017-04-22 NOTE — Assessment & Plan Note (Addendum)
Currently 2-4 beers/day.  Up to 12 on the w/e.  Encouraged to cut back.

## 2017-04-22 NOTE — Assessment & Plan Note (Signed)
He is doing well on biktarvy Offered/refused condoms.  vax are up to date Check GC/Chlamydia at next visit.  rtc in 9 months

## 2017-04-22 NOTE — Progress Notes (Signed)
   Subjective:    Patient ID: Brandon Joyce, male    DOB: Jul 22, 1950, 67 y.o.   MRN: [id number]  HPI 67yo M with HIV+, statin related myalgias, hyperlipidemia, erectile dysfunction, hypertension, ETOH use.  He had cardiac cath 07-2013 that showed non-obstr disease. Had surgery on 04-27-15 shoulder due to reported work related injury. This has resolved.  Had another work related injury fall 2018, C6 pinched nerve.  He had a nerve block which helped for ~ 48h.  He has been maintained on atripla ---> biktarvy (12-2016).  Married, wife in wheel chair for last 12 years.     HIV 1 RNA Quant (copies/mL)  Date Value  04/08/2017 <20 NOT DETECTED  12/16/2016 <20 NOT DETECTED  07/30/2016 <20 NOT DETECTED   CD4 T Cell Abs (/uL)  Date Value  04/08/2017 490  12/16/2016 540  07/30/2016 520    Review of Systems  Constitutional: Negative for appetite change and unexpected weight change.  Gastrointestinal: Negative for constipation and diarrhea.  Genitourinary: Negative for difficulty urinating.  Musculoskeletal: Positive for arthralgias and neck pain.  Psychiatric/Behavioral: Negative for sleep disturbance.  Please see HPI. All other systems reviewed and negative.     Objective:   Physical Exam  Constitutional: He appears well-developed and well-nourished.  HENT:  Mouth/Throat: No oropharyngeal exudate.  Eyes: EOM are normal. Pupils are equal, round, and reactive to light.  Neck: Neck supple.  Cardiovascular: Normal rate, regular rhythm and normal heart sounds.  Pulmonary/Chest: Effort normal and breath sounds normal.  Abdominal: Soft. Bowel sounds are normal. There is no tenderness. There is no rebound.  Musculoskeletal: He exhibits no edema.  Lymphadenopathy:    He has no cervical adenopathy.  Psychiatric: He has a normal mood and affect.      Assessment & Plan:

## 2017-04-22 NOTE — Assessment & Plan Note (Signed)
He is going to f/u with uro.

## 2017-04-22 NOTE — Assessment & Plan Note (Signed)
On statin Lab Results  Component Value Date   CHOL 170 04/08/2017   HDL 44 04/08/2017   LDLCALC 69 07/30/2016   TRIG 275 (H) 04/08/2017   CHOLHDL 3.9 04/08/2017    LFTs normal.

## 2017-07-16 DIAGNOSIS — N529 Male erectile dysfunction, unspecified: Secondary | ICD-10-CM | POA: Diagnosis not present

## 2017-07-16 DIAGNOSIS — N486 Induration penis plastica: Secondary | ICD-10-CM | POA: Diagnosis not present

## 2017-08-11 DIAGNOSIS — I517 Cardiomegaly: Secondary | ICD-10-CM | POA: Diagnosis not present

## 2017-08-11 DIAGNOSIS — N486 Induration penis plastica: Secondary | ICD-10-CM | POA: Diagnosis not present

## 2017-08-11 DIAGNOSIS — R001 Bradycardia, unspecified: Secondary | ICD-10-CM | POA: Diagnosis not present

## 2017-08-12 DIAGNOSIS — N486 Induration penis plastica: Secondary | ICD-10-CM | POA: Diagnosis not present

## 2017-08-12 DIAGNOSIS — N529 Male erectile dysfunction, unspecified: Secondary | ICD-10-CM | POA: Diagnosis not present

## 2017-10-28 ENCOUNTER — Other Ambulatory Visit: Payer: Self-pay | Admitting: Cardiology

## 2017-10-28 DIAGNOSIS — I10 Essential (primary) hypertension: Secondary | ICD-10-CM

## 2017-10-28 NOTE — Telephone Encounter (Signed)
Rx request sent to pharmacy.  

## 2017-12-16 DIAGNOSIS — M542 Cervicalgia: Secondary | ICD-10-CM | POA: Insufficient documentation

## 2018-01-21 ENCOUNTER — Other Ambulatory Visit: Payer: Self-pay | Admitting: Infectious Diseases

## 2018-01-22 ENCOUNTER — Other Ambulatory Visit: Payer: Self-pay | Admitting: Cardiology

## 2018-01-22 DIAGNOSIS — I10 Essential (primary) hypertension: Secondary | ICD-10-CM

## 2018-01-26 ENCOUNTER — Other Ambulatory Visit: Payer: BLUE CROSS/BLUE SHIELD

## 2018-01-26 ENCOUNTER — Other Ambulatory Visit (HOSPITAL_COMMUNITY)
Admission: RE | Admit: 2018-01-26 | Discharge: 2018-01-26 | Disposition: A | Discharge status: Home / Self Care | Payer: BLUE CROSS/BLUE SHIELD | Source: Ambulatory Visit | Attending: Infectious Diseases | Admitting: Infectious Diseases

## 2018-01-26 DIAGNOSIS — Z113 Encounter for screening for infections with a predominantly sexual mode of transmission: Secondary | ICD-10-CM

## 2018-01-26 DIAGNOSIS — B20 Human immunodeficiency virus [HIV] disease: Secondary | ICD-10-CM

## 2018-01-27 LAB — URINE CYTOLOGY ANCILLARY ONLY
Chlamydia: NEGATIVE
Neisseria Gonorrhea: NEGATIVE

## 2018-01-27 LAB — T-HELPER CELL (CD4) - (RCID CLINIC ONLY)
CD4 % Helper T Cell: 38 % (ref 33–55)
CD4 T Cell Abs: 510 /uL (ref 400–2700)

## 2018-01-28 ENCOUNTER — Other Ambulatory Visit: Payer: Self-pay | Admitting: Cardiology

## 2018-01-28 DIAGNOSIS — I10 Essential (primary) hypertension: Secondary | ICD-10-CM

## 2018-01-28 LAB — HIV-1 RNA QUANT-NO REFLEX-BLD
HIV 1 RNA Quant: <20 copies/mL
HIV-1 RNA Quant, Log: <1.3 Log copies/mL

## 2018-01-28 LAB — CBC
HCT: 42.5 % (ref 38.5–50.0)
Hemoglobin: 14.7 g/dL (ref 13.2–17.1)
MCH: 29.8 pg (ref 27.0–33.0)
MCHC: 34.6 g/dL (ref 32.0–36.0)
MCV: 86.2 fL (ref 80.0–100.0)
MPV: 9.5 fL (ref 7.5–12.5)
Platelets: 222 10*3/uL (ref 140–400)
RBC: 4.93 10*6/uL (ref 4.20–5.80)
RDW: 14.4 % (ref 11.0–15.0)
WBC: 4.6 10*3/uL (ref 3.8–10.8)

## 2018-01-28 LAB — COMPREHENSIVE METABOLIC PANEL
AG Ratio: 1.9 (calc) (ref 1.0–2.5)
ALT: 43 U/L (ref 9–46)
AST: 24 U/L (ref 10–35)
Albumin: 4.4 g/dL (ref 3.6–5.1)
Alkaline phosphatase (APISO): 75 U/L (ref 40–115)
BUN: 11 mg/dL (ref 7–25)
CO2: 24 mmol/L (ref 20–32)
Calcium: 9.3 mg/dL (ref 8.6–10.3)
Chloride: 106 mmol/L (ref 98–110)
Creat: 1.16 mg/dL (ref 0.70–1.25)
Globulin: 2.3 g/dL (calc) (ref 1.9–3.7)
Glucose, Bld: 112 mg/dL — ABNORMAL HIGH (ref 65–99)
Potassium: 4 mmol/L (ref 3.5–5.3)
Sodium: 138 mmol/L (ref 135–146)
TOTAL PROTEIN: 6.7 g/dL (ref 6.1–8.1)
Total Bilirubin: 0.3 mg/dL (ref 0.2–1.2)

## 2018-01-28 NOTE — Telephone Encounter (Signed)
Rx request sent to pharmacy.  

## 2018-02-06 ENCOUNTER — Other Ambulatory Visit: Payer: Self-pay | Admitting: Infectious Diseases

## 2018-02-09 ENCOUNTER — Ambulatory Visit: Payer: Self-pay | Admitting: Infectious Diseases

## 2018-02-09 ENCOUNTER — Ambulatory Visit (INDEPENDENT_AMBULATORY_CARE_PROVIDER_SITE_OTHER): Payer: BLUE CROSS/BLUE SHIELD | Admitting: Infectious Diseases

## 2018-02-09 ENCOUNTER — Encounter: Payer: Self-pay | Admitting: Infectious Diseases

## 2018-02-09 ENCOUNTER — Other Ambulatory Visit: Payer: Self-pay

## 2018-02-09 VITALS — BP 158/89 | HR 62 | Temp 98.3°F | Wt 204.0 lb

## 2018-02-09 DIAGNOSIS — Z23 Encounter for immunization: Secondary | ICD-10-CM

## 2018-02-09 DIAGNOSIS — R739 Hyperglycemia, unspecified: Secondary | ICD-10-CM

## 2018-02-09 DIAGNOSIS — F528 Other sexual dysfunction not due to a substance or known physiological condition: Secondary | ICD-10-CM

## 2018-02-09 DIAGNOSIS — E7800 Pure hypercholesterolemia, unspecified: Secondary | ICD-10-CM

## 2018-02-09 DIAGNOSIS — F1020 Alcohol dependence, uncomplicated: Secondary | ICD-10-CM

## 2018-02-09 DIAGNOSIS — Z79899 Other long term (current) drug therapy: Secondary | ICD-10-CM

## 2018-02-09 DIAGNOSIS — Z113 Encounter for screening for infections with a predominantly sexual mode of transmission: Secondary | ICD-10-CM

## 2018-02-09 DIAGNOSIS — B20 Human immunodeficiency virus [HIV] disease: Secondary | ICD-10-CM | POA: Diagnosis not present

## 2018-02-09 DIAGNOSIS — I10 Essential (primary) hypertension: Secondary | ICD-10-CM

## 2018-02-09 NOTE — Assessment & Plan Note (Signed)
Encouraged him to watch his ETOH intake. 3-4 beer/evening. 3-4/week.  Ask Rollene Fare to eval- he refuses.

## 2018-02-09 NOTE — Assessment & Plan Note (Signed)
Will check A1C at next lab.

## 2018-02-09 NOTE — Assessment & Plan Note (Signed)
Has condoms Will give PCV and Flu Needs Shingles vax Continue biktarvy rtc in 9 months

## 2018-02-09 NOTE — Assessment & Plan Note (Signed)
His lipids were good at last check and his LFTs are normal at this visit.

## 2018-02-09 NOTE — Assessment & Plan Note (Addendum)
Continue on ACE-I.  Encouraged him to lose wt before change medications.

## 2018-02-09 NOTE — Assessment & Plan Note (Addendum)
He has tried multiple methods.  Urology referal defered by pt

## 2018-02-09 NOTE — Progress Notes (Signed)
   Subjective:    Patient ID: Brandon Joyce, male    DOB: 06-11-1950, 67 y.o.   MRN: [id number]  HPI 67yo M with HIV+, statin related myalgias, hyperlipidemia, erectile dysfunction, hypertension, ETOH use.  He had cardiac cath 07-2013 that showed non-obstr disease. Had surgery on 04-27-15 shoulder due to reported work related injury. This has resolved.  Had another work related injury fall 2018, C6 pinched nerve.  He had a nerve block which helped for ~ 48h. He had c-spine surgery 10-23-17. He has not been able to work due to his restrictions with this.  He will not have a job after 02-2018.  He is worried he may have to retire/loss of income.  Has been drinking more.   Has been getting shots for ED (didn't work due lack of spontaneity), then has tried pump.  Wife is not satisfied with result ("shorter").  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  Married, wife in wheel chair for last 12 years.   HIV 1 RNA Quant (copies/mL)  Date Value  01/26/2018 <20 NOT DETECTED  04/08/2017 <20 NOT DETECTED  12/16/2016 <20 NOT DETECTED   CD4 T Cell Abs (/uL)  Date Value  01/26/2018 510  04/08/2017 490  12/16/2016 540   Recent FSG 112, can't remember if he was fasting.   Review of Systems  Constitutional: Positive for unexpected weight change. Negative for appetite change.  Respiratory: Positive for shortness of breath.   Gastrointestinal: Negative for constipation and diarrhea.  Genitourinary: Negative for decreased urine volume and difficulty urinating.  Neurological: Positive for headaches.  was told his headaches were attributed to "the way he is sleeping".  Wt up 14#. 24# over his expected.  DOE walking up 2 flights of stairs.  Please see HPI. All other systems reviewed and negative.     Objective:   Physical Exam Constitutional:      Appearance: Normal appearance.  HENT:     Head: Normocephalic.     Mouth/Throat:     Mouth: Mucous membranes are moist.     Pharynx: No oropharyngeal  exudate.  Eyes:     Extraocular Movements: Extraocular movements intact.     Pupils: Pupils are equal, round, and reactive to light.  Neck:     Musculoskeletal: Normal range of motion and neck supple. No muscular tenderness.  Cardiovascular:     Rate and Rhythm: Normal rate and regular rhythm.  Pulmonary:     Effort: Pulmonary effort is normal.     Breath sounds: Normal breath sounds.  Abdominal:     General: Bowel sounds are normal.     Palpations: Abdomen is soft.     Tenderness: There is no abdominal tenderness.  Musculoskeletal:        General: No swelling.  Neurological:     Mental Status: He is alert.        Assessment & Plan:

## 2018-02-09 NOTE — Addendum Note (Signed)
Addended by: Landis Gandy on: 02/09/2018 05:17 PM   Modules accepted: Orders

## 2018-02-10 ENCOUNTER — Other Ambulatory Visit: Payer: Self-pay | Admitting: Infectious Diseases

## 2018-03-04 ENCOUNTER — Ambulatory Visit: Payer: BLUE CROSS/BLUE SHIELD

## 2018-03-04 ENCOUNTER — Other Ambulatory Visit: Payer: Self-pay | Admitting: Pharmacist

## 2018-03-04 DIAGNOSIS — B20 Human immunodeficiency virus [HIV] disease: Secondary | ICD-10-CM

## 2018-03-04 MED ORDER — BICTEGRAVIR-EMTRICITAB-TENOFOV 50-200-25 MG PO TABS: 1.0000 | ORAL_TABLET | Freq: Every day | ORAL | 5 refills | Status: DC

## 2018-03-11 ENCOUNTER — Encounter: Payer: Self-pay | Admitting: Infectious Diseases

## 2018-03-23 ENCOUNTER — Telehealth: Payer: Self-pay | Admitting: Pharmacy Technician

## 2018-03-23 NOTE — Telephone Encounter (Signed)
I called Brandon Joyce[facility] and gave them his insurance and a copay card (PAF) so that they could get him the medicaition.  I then patched him in to the call so they could set up shipment. ID [id number] B  [id number] P  [id number] G  [id number]

## 2018-04-29 ENCOUNTER — Other Ambulatory Visit: Payer: Self-pay | Admitting: Cardiology

## 2018-04-29 DIAGNOSIS — I10 Essential (primary) hypertension: Secondary | ICD-10-CM

## 2018-04-30 ENCOUNTER — Other Ambulatory Visit: Payer: Self-pay | Admitting: Cardiology

## 2018-04-30 DIAGNOSIS — I10 Essential (primary) hypertension: Secondary | ICD-10-CM

## 2018-04-30 NOTE — Progress Notes (Signed)
HPI: FU hypertension. Echocardiogram in May 2015 showed normal LV function. Nuclear study May of 2015 was normal with ejection fraction 59%. Cardiac catheterization June 2015 showed minimal nonobstructive coronary disease and normal LV function. Had atenolol DCed previously due to bradycardia. Since last seen, the patient has dyspnea with more extreme activities but not with routine activities. It is relieved with rest. It is not associated with chest pain. There is no orthopnea, PND or pedal edema. There is no syncope or palpitations. There is no exertional chest pain.   Current Outpatient Medications  Medication Sig Dispense Refill  . aspirin 81 MG tablet Take 2 tablets (162 mg total) by mouth daily. 90 tablet 3  . atorvastatin (LIPITOR) 20 MG tablet TAKE 1 TABLET DAILY (NEED OFFICE VISIT) 90 tablet 0  . bictegravir-emtricitabine-tenofovir AF (BIKTARVY) 50-200-25 MG TABS tablet Take 1 tablet by mouth daily. 30 tablet 5  . gabapentin (NEURONTIN) 300 MG capsule     . ibuprofen (IBU) 800 MG tablet IBU 800 mg tablet  Take 1 tablet 3 times a day by oral route with meals.    Marland Kitchen lisinopril (PRINIVIL,ZESTRIL) 40 MG tablet TAKE 1 TABLET DAILY (SCHEDULE APPOINTMENT FOR REFILLS) 90 tablet 4   No current facility-administered medications for this visit.      Past Medical History:  Diagnosis Date  . Alcoholism ([place])   . ED (erectile dysfunction)   . HIV disease ([place])   . HLD (hyperlipidemia)   . HTN (hypertension)   . Pneumonia     Past Surgical History:  Procedure Laterality Date  . BACK SURGERY    . LEFT HEART CATHETERIZATION WITH CORONARY ANGIOGRAM N/A 07/28/2013   Procedure: LEFT HEART CATHETERIZATION WITH CORONARY ANGIOGRAM;  Surgeon: Jettie Booze, MD;  Location: [facility] CATH LAB;  Service: Cardiovascular;  Laterality: N/A;  . ROTATOR CUFF REPAIR Right 04-27-2015  . TONSILLECTOMY      Social History   Socioeconomic History  . Marital status: Married    Spouse name: Not on  file  . Number of children: 3  . Years of education: Not on file  . Highest education level: Not on file  Occupational History    Employer: [employer]  Social Needs  . Financial resource strain: Not on file  . Food insecurity:    Worry: Not on file    Inability: Not on file  . Transportation needs:    Medical: Not on file    Non-medical: Not on file  Tobacco Use  . Smoking status: Never Smoker  . Smokeless tobacco: Never Used  Substance and Sexual Activity  . Alcohol use: Yes    Alcohol/week: 21.0 standard drinks    Types: 21 Standard drinks or equivalent per week    Comment: beer and whiskey - has increased  . Drug use: No  . Sexual activity: Not Currently    Partners: Female    Birth control/protection: Condom    Comment: accepted condoms today  Lifestyle  . Physical activity:    Days per week: Not on file    Minutes per session: Not on file  . Stress: Not on file  Relationships  . Social connections:    Talks on phone: Not on file    Gets together: Not on file    Attends religious service: Not on file    Active member of club or organization: Not on file    Attends meetings of clubs or organizations: Not on file    Relationship  status: Not on file  . Intimate partner violence:    Fear of current or ex partner: Not on file    Emotionally abused: Not on file    Physically abused: Not on file    Forced sexual activity: Not on file  Other Topics Concern  . Not on file  Social History Narrative  . Not on file    Family History  Problem Relation Age of Onset  . Diverticulitis Mother   . Heart disease Mother   . Atrial fibrillation Mother   . Heart disease Father        CABG  . Colon cancer Brother     ROS: no fevers or chills, productive cough, hemoptysis, dysphasia, odynophagia, melena, hematochezia, dysuria, hematuria, rash, seizure activity, orthopnea, PND, pedal edema, claudication. Remaining systems are negative.  Physical Exam: Well-developed  well-nourished in no acute distress.  Skin is warm and dry.  HEENT is normal.  Neck is supple.  Chest is clear to auscultation with normal expansion.  Cardiovascular exam is regular rate and rhythm.  Abdominal exam nontender or distended. No masses palpated. Extremities show no edema. neuro grossly intact  ECG-normal sinus rhythm at a rate of 72, normal axis, lateral T wave inversion.  Personally reviewed  A/P  1 hypertension-patient's blood pressure is mildly elevated.  Add amlodipine 5 mg daily and follow blood pressure.  2 hyperlipidemia-continue statin.  Lipids and liver monitored by primary care.  3 HIV-Per primary care.  Kirk Ruths, MD

## 2018-05-05 ENCOUNTER — Encounter: Payer: Self-pay | Admitting: Cardiology

## 2018-05-05 ENCOUNTER — Ambulatory Visit (INDEPENDENT_AMBULATORY_CARE_PROVIDER_SITE_OTHER): Payer: Medicare HMO | Admitting: Cardiology

## 2018-05-05 ENCOUNTER — Other Ambulatory Visit: Payer: Self-pay

## 2018-05-05 VITALS — BP 142/90 | HR 76 | Ht 68.0 in | Wt 208.4 lb

## 2018-05-05 DIAGNOSIS — E7800 Pure hypercholesterolemia, unspecified: Secondary | ICD-10-CM | POA: Diagnosis not present

## 2018-05-05 DIAGNOSIS — I10 Essential (primary) hypertension: Secondary | ICD-10-CM | POA: Diagnosis not present

## 2018-05-05 MED ORDER — AMLODIPINE BESYLATE 5 MG PO TABS: 5.0000 mg | ORAL_TABLET | Freq: Every day | ORAL | 3 refills | Status: DC

## 2018-05-05 NOTE — Patient Instructions (Signed)
Medication Instructions:  START AMLODIPINE 5 MG ONCE DAILY If you need a refill on your cardiac medications before your next appointment, please call your pharmacy.   Lab work: If you have labs (blood work) drawn today and your tests are completely normal, you will receive your results only by: Marland Kitchen MyChart Message (if you have MyChart) OR . A paper copy in the mail If you have any lab test that is abnormal or we need to change your treatment, we will call you to review the results.  Follow-Up: . Your physician wants you to follow-up in: [place] will receive a reminder letter in the mail two months in advance. If you don't receive a letter, please call our office to schedule the follow-up appointment.

## 2018-06-14 ENCOUNTER — Telehealth: Payer: Self-pay | Admitting: Cardiology

## 2018-06-14 DIAGNOSIS — I10 Essential (primary) hypertension: Secondary | ICD-10-CM

## 2018-06-14 MED ORDER — ATORVASTATIN CALCIUM 20 MG PO TABS: 20.0000 mg | ORAL_TABLET | Freq: Every day | ORAL | 2 refills | Status: DC

## 2018-06-14 NOTE — Telephone Encounter (Signed)
°*  STAT* If patient is at the pharmacy, call can be transferred to refill team.   1. Which medications need to be refilled? (please list name of each medication and dose if known) ATORVASTAIN 20mg   & LISINOPRIL 40mg   2. Which pharmacy/location (including street and city if local pharmacy) is medication to be sent to? [facility]  3. Do they need a 30 day or 90 day supply? 28 DAY  Pt only has 10days worth now.     Please call pt if any question.

## 2018-06-14 NOTE — Telephone Encounter (Signed)
Atorvastatin 20 mg refilled. 

## 2018-06-17 ENCOUNTER — Other Ambulatory Visit: Payer: Self-pay | Admitting: Cardiology

## 2018-06-17 DIAGNOSIS — I10 Essential (primary) hypertension: Secondary | ICD-10-CM

## 2018-06-17 MED ORDER — LISINOPRIL 40 MG PO TABS: ORAL_TABLET | ORAL | 1 refills | Status: DC

## 2018-06-17 MED ORDER — ATORVASTATIN CALCIUM 20 MG PO TABS: 20.0000 mg | ORAL_TABLET | Freq: Every day | ORAL | 1 refills | Status: DC

## 2018-06-17 NOTE — Telephone Encounter (Signed)
°*  STAT* If patient is at the pharmacy, call can be transferred to refill team.   1. Which medications need to be refilled? (please list name of each medication and dose if known) lisinopril (PRINIVIL,ZESTRIL) 40 MG tablet atorvastatin (LIPITOR) 20 MG tablet  2. Which pharmacy/location (including street and city if local pharmacy) is medication to be sent to? [place] [address]  3. Do they need a 30 day or 90 day supply? 90 days

## 2018-06-17 NOTE — Telephone Encounter (Signed)
Prinivil and Atorvastatin refilled.

## 2018-08-04 DIAGNOSIS — H9313 Tinnitus, bilateral: Secondary | ICD-10-CM | POA: Diagnosis not present

## 2018-08-04 DIAGNOSIS — H903 Sensorineural hearing loss, bilateral: Secondary | ICD-10-CM | POA: Diagnosis not present

## 2018-08-22 ENCOUNTER — Other Ambulatory Visit: Payer: Self-pay | Admitting: Infectious Diseases

## 2018-08-22 DIAGNOSIS — B20 Human immunodeficiency virus [HIV] disease: Secondary | ICD-10-CM

## 2018-09-13 ENCOUNTER — Ambulatory Visit: Payer: Medicare HMO

## 2018-09-14 ENCOUNTER — Ambulatory Visit: Payer: Medicare HMO

## 2018-09-15 ENCOUNTER — Encounter: Payer: Self-pay | Admitting: Infectious Diseases

## 2018-09-22 DIAGNOSIS — N529 Male erectile dysfunction, unspecified: Secondary | ICD-10-CM | POA: Diagnosis not present

## 2018-09-22 DIAGNOSIS — N400 Enlarged prostate without lower urinary tract symptoms: Secondary | ICD-10-CM | POA: Diagnosis not present

## 2018-11-11 ENCOUNTER — Other Ambulatory Visit: Payer: Self-pay

## 2018-11-11 ENCOUNTER — Other Ambulatory Visit: Payer: Medicare HMO

## 2018-11-11 ENCOUNTER — Other Ambulatory Visit (HOSPITAL_COMMUNITY)
Admission: RE | Admit: 2018-11-11 | Discharge: 2018-11-11 | Disposition: A | Discharge status: Home / Self Care | Payer: Medicare HMO | Source: Ambulatory Visit | Attending: Infectious Diseases | Admitting: Infectious Diseases

## 2018-11-11 DIAGNOSIS — B20 Human immunodeficiency virus [HIV] disease: Secondary | ICD-10-CM

## 2018-11-11 DIAGNOSIS — Z79899 Other long term (current) drug therapy: Secondary | ICD-10-CM | POA: Diagnosis not present

## 2018-11-11 DIAGNOSIS — R69 Illness, unspecified: Secondary | ICD-10-CM | POA: Diagnosis not present

## 2018-11-11 DIAGNOSIS — Z113 Encounter for screening for infections with a predominantly sexual mode of transmission: Secondary | ICD-10-CM

## 2018-11-11 DIAGNOSIS — E7800 Pure hypercholesterolemia, unspecified: Secondary | ICD-10-CM

## 2018-11-11 DIAGNOSIS — R739 Hyperglycemia, unspecified: Secondary | ICD-10-CM | POA: Diagnosis not present

## 2018-11-13 LAB — URINE CYTOLOGY ANCILLARY ONLY
Chlamydia: NEGATIVE
Neisseria Gonorrhea: NEGATIVE

## 2018-11-13 LAB — HELPER T-LYMPH-CD4 (ARMC ONLY)
% CD 4 Pos. Lymph.: 35.3 % (ref 30.8–58.5)
Absolute CD 4 Helper: 530 /uL (ref 359–1519)
Basophils Absolute: 0.1 10*3/uL (ref 0.0–0.2)
Basos: 1 %
EOS (ABSOLUTE): 0.1 10*3/uL (ref 0.0–0.4)
Eos: 3 %
Hematocrit: 46.5 % (ref 37.5–51.0)
Hemoglobin: 15.3 g/dL (ref 13.0–17.7)
Immature Grans (Abs): 0 10*3/uL (ref 0.0–0.1)
Immature Granulocytes: 0 %
Lymphocytes Absolute: 1.5 10*3/uL (ref 0.7–3.1)
Lymphs: 33 %
MCH: 30.2 pg (ref 26.6–33.0)
MCHC: 32.9 g/dL (ref 31.5–35.7)
MCV: 92 fL (ref 79–97)
Monocytes Absolute: 0.5 10*3/uL (ref 0.1–0.9)
Monocytes: 10 %
Neutrophils Absolute: 2.4 10*3/uL (ref 1.4–7.0)
Neutrophils: 53 %
Platelets: 213 10*3/uL (ref 150–450)
RBC: 5.07 x10E6/uL (ref 4.14–5.80)
RDW: 14.1 % (ref 11.6–15.4)
WBC: 4.6 10*3/uL (ref 3.4–10.8)

## 2018-11-16 LAB — COMPREHENSIVE METABOLIC PANEL
AG Ratio: 1.8 (calc) (ref 1.0–2.5)
ALT: 36 U/L (ref 9–46)
AST: 22 U/L (ref 10–35)
Albumin: 4.4 g/dL (ref 3.6–5.1)
Alkaline phosphatase (APISO): 52 U/L (ref 35–144)
BUN/Creatinine Ratio: 8 (calc) (ref 6–22)
BUN: 12 mg/dL (ref 7–25)
CO2: 24 mmol/L (ref 20–32)
Calcium: 9.4 mg/dL (ref 8.6–10.3)
Chloride: 105 mmol/L (ref 98–110)
Creat: 1.42 mg/dL — ABNORMAL HIGH (ref 0.70–1.25)
Globulin: 2.5 g/dL (calc) (ref 1.9–3.7)
Glucose, Bld: 105 mg/dL — ABNORMAL HIGH (ref 65–99)
Potassium: 4.4 mmol/L (ref 3.5–5.3)
Sodium: 136 mmol/L (ref 135–146)
Total Bilirubin: 0.6 mg/dL (ref 0.2–1.2)
Total Protein: 6.9 g/dL (ref 6.1–8.1)

## 2018-11-16 LAB — LIPID PANEL
Cholesterol: 157 mg/dL (ref <200)
HDL: 35 mg/dL — ABNORMAL LOW (ref >=40)
LDL Cholesterol (Calc): 86 mg/dL (calc)
Non-HDL Cholesterol (Calc): 122 mg/dL (calc) (ref <130)
Total CHOL/HDL Ratio: 4.5 (calc) (ref <5.0)
Triglycerides: 296 mg/dL — ABNORMAL HIGH (ref <150)

## 2018-11-16 LAB — HEMOGLOBIN A1C
Hgb A1c MFr Bld: 5.9 % of total Hgb — ABNORMAL HIGH (ref <5.7)
Mean Plasma Glucose: 123 (calc)
eAG (mmol/L): 6.8 (calc)

## 2018-11-16 LAB — CBC
HCT: 45 % (ref 38.5–50.0)
Hemoglobin: 15.3 g/dL (ref 13.2–17.1)
MCH: 29.7 pg (ref 27.0–33.0)
MCHC: 34 g/dL (ref 32.0–36.0)
MCV: 87.4 fL (ref 80.0–100.0)
MPV: 9.8 fL (ref 7.5–12.5)
Platelets: 219 10*3/uL (ref 140–400)
RBC: 5.15 10*6/uL (ref 4.20–5.80)
RDW: 13.5 % (ref 11.0–15.0)
WBC: 4.6 10*3/uL (ref 3.8–10.8)

## 2018-11-16 LAB — RPR: RPR Ser Ql: NONREACTIVE

## 2018-11-16 LAB — HIV-1 RNA QUANT-NO REFLEX-BLD
HIV 1 RNA Quant: <20 copies/mL
HIV-1 RNA Quant, Log: <1.3 Log copies/mL

## 2018-11-30 ENCOUNTER — Encounter: Payer: Self-pay | Admitting: Infectious Diseases

## 2018-12-09 ENCOUNTER — Encounter: Payer: Self-pay | Admitting: Infectious Diseases

## 2018-12-09 ENCOUNTER — Ambulatory Visit: Payer: Medicare HMO | Admitting: Infectious Diseases

## 2018-12-09 ENCOUNTER — Other Ambulatory Visit: Payer: Self-pay

## 2018-12-09 VITALS — Wt 219.0 lb

## 2018-12-09 DIAGNOSIS — F329 Major depressive disorder, single episode, unspecified: Secondary | ICD-10-CM | POA: Insufficient documentation

## 2018-12-09 DIAGNOSIS — E7800 Pure hypercholesterolemia, unspecified: Secondary | ICD-10-CM

## 2018-12-09 DIAGNOSIS — B20 Human immunodeficiency virus [HIV] disease: Secondary | ICD-10-CM

## 2018-12-09 DIAGNOSIS — I10 Essential (primary) hypertension: Secondary | ICD-10-CM | POA: Diagnosis not present

## 2018-12-09 DIAGNOSIS — Z23 Encounter for immunization: Secondary | ICD-10-CM | POA: Diagnosis not present

## 2018-12-09 DIAGNOSIS — Z79899 Other long term (current) drug therapy: Secondary | ICD-10-CM | POA: Diagnosis not present

## 2018-12-09 DIAGNOSIS — R69 Illness, unspecified: Secondary | ICD-10-CM | POA: Diagnosis not present

## 2018-12-09 DIAGNOSIS — Z113 Encounter for screening for infections with a predominantly sexual mode of transmission: Secondary | ICD-10-CM

## 2018-12-09 DIAGNOSIS — F1020 Alcohol dependence, uncomplicated: Secondary | ICD-10-CM | POA: Diagnosis not present

## 2018-12-09 DIAGNOSIS — H9313 Tinnitus, bilateral: Secondary | ICD-10-CM | POA: Diagnosis not present

## 2018-12-09 NOTE — Assessment & Plan Note (Addendum)
3-4 beers/evening with supper.  LFTs normal.  He is going to cut back with his exercise program.

## 2018-12-09 NOTE — Assessment & Plan Note (Signed)
Lab Results  Component Value Date   CHOL 157 11/11/2018   HDL 35 (L) 11/11/2018   LDLCALC 86 11/11/2018   TRIG 296 (H) 11/11/2018   CHOLHDL 4.5 11/11/2018    Well controlled on statin.

## 2018-12-09 NOTE — Addendum Note (Signed)
Addended by: Laverle Patter on: 12/09/2018 04:09 PM   Modules accepted: Orders

## 2018-12-09 NOTE — Progress Notes (Signed)
   Subjective:    Patient ID: Brandon Joyce, male    DOB: [birth year], 68 y.o.   MRN: [id number]  HPI 68yo M with HIV+, statin related myalgias, hyperlipidemia, erectile dysfunction, hypertension, ETOH use.  He had cardiac cath 07-2013 that showed non-obstr disease. Had surgery on 04-27-15 shoulder due to reported work related injury.This has resolved.  Had another work related injury fall 2018, C6 pinched nerve. He had a nerve block which helped for ~ 48h. He had c-spine surgery 10-23-17. He is out of work.  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 Worse with moving jaw, no change with moving head. Had hearing test done.  Got penoplasty 07-2017 for ED.  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 No problems with ART.   Married, wife in wheel chair for last 13 years. Also has "on again, off again" girlfriend.   HIV 1 RNA Quant (copies/mL)  Date Value  11/11/2018 <20 NOT DETECTED  01/26/2018 <20 NOT DETECTED  04/08/2017 <20 NOT DETECTED   CD4 T Cell Abs (/uL)  Date Value  01/26/2018 510  04/08/2017 490  12/16/2016 540    Review of Systems  Constitutional: Negative for appetite change, chills, fever and unexpected weight change.  Respiratory: Negative for cough and shortness of breath.   Gastrointestinal: Negative for constipation and diarrhea.  Genitourinary: Negative for difficulty urinating.  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   Psychiatric/Behavioral: Negative for sleep disturbance.   Is going to start going to [facility] with a friend to try and lose wt.  Please see HPI. All other systems reviewed and negative.    Objective:   Physical Exam Constitutional:      Appearance: Normal appearance. He is obese.  HENT:     Right Ear: No decreased hearing noted. No tenderness. A middle ear effusion is present. Tympanic membrane is retracted.     Left Ear: No decreased hearing noted. No tenderness. A middle ear effusion is present. Tympanic membrane  is retracted.     Mouth/Throat:     Mouth: Mucous membranes are moist.     Pharynx: No oropharyngeal exudate.  Eyes:     Extraocular Movements: Extraocular movements intact.     Pupils: Pupils are equal, round, and reactive to light.  Neck:     Musculoskeletal: Normal range of motion and neck supple.     Vascular: Normal carotid pulses. No carotid bruit.  Cardiovascular:     Rate and Rhythm: Normal rate and regular rhythm.  Pulmonary:     Effort: Pulmonary effort is normal.     Breath sounds: Normal breath sounds.  Abdominal:     General: Bowel sounds are normal. There is no distension.     Palpations: Abdomen is soft.     Tenderness: There is no abdominal tenderness.  Musculoskeletal:     Right lower leg: No edema.     Left lower leg: No edema.  Neurological:     General: No focal deficit present.     Mental Status: He is alert.  Psychiatric:        Mood and Affect: Mood normal.       Assessment & Plan:

## 2018-12-09 NOTE — Assessment & Plan Note (Signed)
He is doing well U=U Offered/refused condoms.  Continue biktarvy.  Will give flu shot. PCV 23 as well.  rtc in 9 months.

## 2018-12-09 NOTE — Assessment & Plan Note (Signed)
We spoke to pt about counseling available in terms of his stress of taking care of parents, wife.  As well his ETOH use.  Her defers.  We also spoke to him about a percieved touching of nurses gluteus. He Electrical engineer.

## 2018-12-09 NOTE — Assessment & Plan Note (Signed)
Appreciate f/u from PCP

## 2018-12-09 NOTE — Assessment & Plan Note (Signed)
Will have him seen by neuro.  His exam is unrevealing.

## 2018-12-10 ENCOUNTER — Encounter: Payer: Self-pay | Admitting: Neurology

## 2018-12-17 ENCOUNTER — Other Ambulatory Visit: Payer: Self-pay | Admitting: Cardiology

## 2018-12-17 DIAGNOSIS — I10 Essential (primary) hypertension: Secondary | ICD-10-CM

## 2018-12-20 ENCOUNTER — Other Ambulatory Visit: Payer: Self-pay | Admitting: Cardiology

## 2018-12-20 ENCOUNTER — Ambulatory Visit: Payer: Medicare HMO | Admitting: Infectious Diseases

## 2018-12-20 DIAGNOSIS — I10 Essential (primary) hypertension: Secondary | ICD-10-CM

## 2019-01-06 NOTE — Progress Notes (Signed)
NEUROLOGY CONSULTATION NOTE  Brandon Joyce MRN: [id number] DOB: [birth year]  Referring provider: Bobby Rumpf, MD Primary care provider: Bobby Rumpf, MD  Reason for consult:  tinnitus  HISTORY OF PRESENT ILLNESS: Brandon Joyce is a 68 year old male with HIV who presents for tinnitus.  History supplemented by ENT and referring provider note.  For the past several months, he reports ringing in both ears, described as a constant but fluctuating high-pitched noise, worse at night.  He has been exposed to loud noises for years (shooting guns, drag racing).  No aggravating or relieving factors.  No associated pain or hearing loss.  He was evaluated by ENT in June.  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  He was told that there was not much that can be helped.    PAST MEDICAL HISTORY: Past Medical History:  Diagnosis Date  . Alcoholism ([place])   . ED (erectile dysfunction)   . HIV disease ([place])   . HLD (hyperlipidemia)   . HTN (hypertension)   . Pneumonia     PAST SURGICAL HISTORY: Past Surgical History:  Procedure Laterality Date  . BACK SURGERY    . LEFT HEART CATHETERIZATION WITH CORONARY ANGIOGRAM N/A 07/28/2013   Procedure: LEFT HEART CATHETERIZATION WITH CORONARY ANGIOGRAM;  Surgeon: Jettie Booze, MD;  Location: [facility] CATH LAB;  Service: Cardiovascular;  Laterality: N/A;  . ROTATOR CUFF REPAIR Right 04-27-2015  . TONSILLECTOMY      MEDICATIONS: Current Outpatient Medications on File Prior to Visit  Medication Sig Dispense Refill  . amLODipine (NORVASC) 5 MG tablet Take 1 tablet (5 mg total) by mouth daily. 90 tablet 3  . aspirin 81 MG tablet Take 2 tablets (162 mg total) by mouth daily. 90 tablet 3  . atorvastatin (LIPITOR) 20 MG tablet Take 1 tablet by mouth once daily 90 tablet 0  . BIKTARVY 50-200-25 MG TABS tablet TAKE 1 TABLET DAILY. 30  tablet 5  . gabapentin (NEURONTIN) 300 MG capsule     . ibuprofen (IBU) 800 MG tablet IBU 800 mg tablet  Take 1 tablet 3 times a day by oral route with meals.    Marland Kitchen lisinopril (ZESTRIL) 40 MG tablet Take 1 tablet by mouth once daily 90 tablet 1   No current facility-administered medications on file prior to visit.     ALLERGIES: Allergies  Allergen Reactions  . Other Rash and Other (See Comments)    Other reaction(s): Other (See Comments), Rash Childhood allergy Childhood allergy Causes "headaches"  . Sulfa Antibiotics Rash and Other (See Comments)    Childhood allergy Childhood allergy  . Sulfacetamide Sodium Rash    Other reaction(s): Other (See Comments) Childhood allergy  . Atenolol Other (See Comments)    "dropped blood pressure" "dropped blood pressure"  . Isosorbide Other (See Comments)    Causes "headaches" Causes "headaches"  . Isosorbide Nitrate     Causes "headaches"  . Nitrates, Organic     Causes "headaches"  . Pentaerythritol Tetranitrate Other (See Comments)    Causes "headaches" Causes "headaches"  . Sulfonamide Derivatives Other (See Comments)    Childhood allergy    FAMILY HISTORY: Family History  Problem Relation Age of Onset  . Diverticulitis Mother   . Heart disease Mother   . Atrial fibrillation Mother   . Heart disease Father        CABG  . Colon cancer Brother  SOCIAL HISTORY: Social History   Socioeconomic History  . Marital status: Married    Spouse name: Not on file  . Number of children: 3  . Years of education: Not on file  . Highest education level: Not on file  Occupational History    Employer: [employer]  Social Needs  . Financial resource strain: Not on file  . Food insecurity    Worry: Not on file    Inability: Not on file  . Transportation needs    Medical: Not on file    Non-medical: Not on file  Tobacco Use  . Smoking status: Never Smoker  . Smokeless tobacco: Never Used  Substance and Sexual  Activity  . Alcohol use: Yes    Alcohol/week: 21.0 standard drinks    Types: 21 Standard drinks or equivalent per week    Comment: beer and whiskey - has increased  . Drug use: No  . Sexual activity: Not Currently    Partners: Female    Birth control/protection: Condom    Comment: accepted condoms today  Lifestyle  . Physical activity    Days per week: Not on file    Minutes per session: Not on file  . Stress: Not on file  Relationships  . Social Herbalist on phone: Not on file    Gets together: Not on file    Attends religious service: Not on file    Active member of club or organization: Not on file    Attends meetings of clubs or organizations: Not on file    Relationship status: Not on file  . Intimate partner violence    Fear of current or ex partner: Not on file    Emotionally abused: Not on file    Physically abused: Not on file    Forced sexual activity: Not on file  Other Topics Concern  . Not on file  Social History Narrative  . Not on file    REVIEW OF SYSTEMS: Constitutional: No fevers, chills, or sweats, no generalized fatigue, change in appetite Eyes: No visual changes, double vision, eye pain Ear, nose and throat: No hearing loss, ear pain, nasal congestion, sore throat Cardiovascular: No chest pain, palpitations Respiratory:  No shortness of breath at rest or with exertion, wheezes GastrointestinaI: No nausea, vomiting, diarrhea, abdominal pain, fecal incontinence Genitourinary:  No dysuria, urinary retention or frequency Musculoskeletal:  No neck pain, back pain Integumentary: No rash, pruritus, skin lesions Neurological: as above Psychiatric: No depression, insomnia, anxiety Endocrine: No palpitations, fatigue, diaphoresis, mood swings, change in appetite, change in weight, increased thirst Hematologic/Lymphatic:  No purpura, petechiae. Allergic/Immunologic: no itchy/runny eyes, nasal congestion, recent allergic reactions, rashes   PHYSICAL EXAM: Blood pressure (!) 164/92, pulse 68, height 5\' 8"  (1.727 m), weight 217 lb 9.6 oz (98.7 kg), SpO2 94 %. General: No acute distress.  Patient appears well-groomed.   Head:  Normocephalic/atraumatic Eyes:  fundi examined but not visualized Neck: supple, no paraspinal tenderness, full range of motion Back: No paraspinal tenderness Heart: regular rate and rhythm Lungs: Clear to auscultation bilaterally. Vascular: No carotid bruits. Neurological Exam: Mental status: alert and oriented to person, place, and time, recent and remote memory intact, fund of knowledge intact, attention and concentration intact, speech fluent and not dysarthric, language intact. Cranial nerves: CN I: not tested CN II: pupils equal, round and reactive to light, visual fields intact CN III, IV, VI:  full range of motion, no nystagmus, no ptosis CN V: facial sensation  intact CN VII: upper and lower face symmetric CN VIII: hearing intact CN IX, X: gag intact, uvula midline CN XI: sternocleidomastoid and trapezius muscles intact CN XII: tongue midline Bulk & Tone: normal, no fasciculations. Motor:  5/5 throughout  Sensation:  temperature and vibration sensation intact.   Deep Tendon Reflexes:  2+ throughout, toes downgoing.   Finger to nose testing:  Without dysmetria.   Heel to shin:  Without dysmetria.   Gait:  Normal station and stride.  Romberg negative.  IMPRESSION: 1.  Tinnitus, bilateral 2.  Sensorineural hearing loss, bilateral 3.  Hypertension  May be nothing able to do.  We will check MRI to look for any structural etiology.  Ultimately, supportive care.  Consider cognitive behavioral therapy.  [facility] may have a tinnitus clinic.  PLAN: 1.  MRI of internal auditory canal with and without contrast 2.  Further recommendations pending results. 3.  Follow up with PCP regarding blood pressure  40 minutes spent face to face with patient, over 50% spent discussing diagnosis and plan.    Thank you for allowing me to take part in the care of this patient.  Metta Clines, DO  CC:  Bobby Rumpf, MD

## 2019-01-07 ENCOUNTER — Encounter: Payer: Self-pay | Admitting: Neurology

## 2019-01-07 ENCOUNTER — Other Ambulatory Visit: Payer: Self-pay

## 2019-01-07 ENCOUNTER — Ambulatory Visit: Payer: Medicare HMO | Admitting: Neurology

## 2019-01-07 VITALS — BP 164/92 | HR 68 | Ht 68.0 in | Wt 217.6 lb

## 2019-01-07 DIAGNOSIS — H903 Sensorineural hearing loss, bilateral: Secondary | ICD-10-CM

## 2019-01-07 DIAGNOSIS — H905 Unspecified sensorineural hearing loss: Secondary | ICD-10-CM | POA: Diagnosis not present

## 2019-01-07 DIAGNOSIS — H9313 Tinnitus, bilateral: Secondary | ICD-10-CM | POA: Diagnosis not present

## 2019-01-07 NOTE — Patient Instructions (Addendum)
1.  We will try to get an MRI of internal auditory canals with and without contrast. 2.  Further recommendations pending results. 3.  Follow up with PCP for high blood pressure.  A referral to [facility] has been placed for your MRI someone will contact you directly to schedule your appt. They are located at [place]. Please contact them directly by calling [phone] with any questions regarding your referral.

## 2019-02-11 ENCOUNTER — Ambulatory Visit
Admission: RE | Admit: 2019-02-11 | Discharge: 2019-02-11 | Disposition: A | Discharge status: Home / Self Care | Payer: Medicare HMO | Source: Ambulatory Visit | Attending: Neurology | Admitting: Neurology

## 2019-02-11 ENCOUNTER — Other Ambulatory Visit: Payer: Self-pay

## 2019-02-11 DIAGNOSIS — H903 Sensorineural hearing loss, bilateral: Secondary | ICD-10-CM | POA: Diagnosis not present

## 2019-02-11 DIAGNOSIS — H9313 Tinnitus, bilateral: Secondary | ICD-10-CM

## 2019-02-11 DIAGNOSIS — H905 Unspecified sensorineural hearing loss: Secondary | ICD-10-CM

## 2019-02-11 MED ORDER — GADOBENATE DIMEGLUMINE 529 MG/ML IV SOLN
20.0000 mL | Freq: Once | INTRAVENOUS | Status: AC | PRN
  Administered 2019-02-11: 20 mL via INTRAVENOUS

## 2019-03-07 ENCOUNTER — Other Ambulatory Visit: Payer: Self-pay | Admitting: Cardiology

## 2019-03-07 DIAGNOSIS — I10 Essential (primary) hypertension: Secondary | ICD-10-CM

## 2019-03-21 ENCOUNTER — Ambulatory Visit: Payer: Medicare HMO

## 2019-03-21 ENCOUNTER — Other Ambulatory Visit: Payer: Self-pay

## 2019-03-22 ENCOUNTER — Encounter: Payer: Self-pay | Admitting: Infectious Diseases

## 2019-03-24 ENCOUNTER — Other Ambulatory Visit: Payer: Self-pay

## 2019-03-24 DIAGNOSIS — B20 Human immunodeficiency virus [HIV] disease: Secondary | ICD-10-CM

## 2019-03-24 MED ORDER — BIKTARVY 50-200-25 MG PO TABS: 1.0000 | ORAL_TABLET | Freq: Every day | ORAL | 5 refills | Status: DC

## 2019-04-29 ENCOUNTER — Telehealth: Payer: Self-pay | Admitting: Pharmacy Technician

## 2019-04-29 NOTE — Telephone Encounter (Signed)
[facility] Patient Advocate Encounter   I was successful in securing patient a $7500 grant from [employer] ([employer]) to provide copayment coverage for Biktarvy. This will make the out of pocket cost $0.     I have spoken with the patient and [facility] on a conference call and provided them the new billing information. He will have the medication mailed.    The billing information is RxBin: [id number] PCN: [id number] Member ID: [id number] Group ID: [id number] Dates of Eligibility: 04/29/2019 through 04/28/2020  Patient knows to call the office with questions or concerns.  Bartholomew Crews, CPhT [facility] Patient [facility] Phone: [phone] Fax: [phone] 04/29/2019 9:47 AM

## 2019-06-19 ENCOUNTER — Other Ambulatory Visit: Payer: Self-pay | Admitting: Cardiology

## 2019-06-19 DIAGNOSIS — I10 Essential (primary) hypertension: Secondary | ICD-10-CM

## 2019-08-02 ENCOUNTER — Other Ambulatory Visit: Payer: Self-pay | Admitting: Cardiology

## 2019-08-02 DIAGNOSIS — I10 Essential (primary) hypertension: Secondary | ICD-10-CM

## 2019-08-23 NOTE — Progress Notes (Signed)
HPI: FU hypertension. Echocardiogram in May 2015 showed normal LV function. Nuclear study May of 2015 was normal with ejection fraction 59%. Cardiac catheterization June 2015 showed minimal nonobstructive coronary disease and normal LV function. Had atenolol DCed previously due to bradycardia. Since last seen,the patient has dyspnea with more extreme activities but not with routine activities. It is relieved with rest. It is not associated with chest pain. There is no orthopnea, PND or pedal edema. There is no syncope or palpitations. There is no exertional chest pain.   Current Outpatient Medications  Medication Sig Dispense Refill  . amLODipine (NORVASC) 5 MG tablet Take 1 tablet (5 mg total) by mouth daily. 90 tablet 3  . aspirin 81 MG tablet Take 2 tablets (162 mg total) by mouth daily. 90 tablet 3  . atorvastatin (LIPITOR) 20 MG tablet Take 1 tablet by mouth once daily 90 tablet 0  . BIKTARVY 50-200-25 MG TABS tablet Take 1 tablet by mouth daily. 30 tablet 5  . lisinopril (ZESTRIL) 40 MG tablet TAKE 1 TABLET BY MOUTH ONCE DAILY NEED  OFFICE  VISIT  FOR  FUTURE  REFILLS 60 tablet 0   No current facility-administered medications for this visit.     Past Medical History:  Diagnosis Date  . Alcoholism ([place])   . ED (erectile dysfunction)   . HIV disease ([place])   . HLD (hyperlipidemia)   . HTN (hypertension)   . Pneumonia     Past Surgical History:  Procedure Laterality Date  . BACK SURGERY    . LEFT HEART CATHETERIZATION WITH CORONARY ANGIOGRAM N/A 07/28/2013   Procedure: LEFT HEART CATHETERIZATION WITH CORONARY ANGIOGRAM;  Surgeon: Jettie Booze, MD;  Location: [facility] CATH LAB;  Service: Cardiovascular;  Laterality: N/A;  . ROTATOR CUFF REPAIR Right 04-27-2015  . TONSILLECTOMY      Social History   Socioeconomic History  . Marital status: Married    Spouse name: Not on file  . Number of children: 3  . Years of education: Not on file  . Highest education level: GED or  equivalent  Occupational History    Employer: [employer]  Tobacco Use  . Smoking status: Never Smoker  . Smokeless tobacco: Never Used  Vaping Use  . Vaping Use: Never used  Substance and Sexual Activity  . Alcohol use: Yes    Alcohol/week: 21.0 standard drinks    Types: 21 Standard drinks or equivalent per week    Comment: beer and whiskey - has increased  . Drug use: No  . Sexual activity: Not Currently    Partners: Female    Birth control/protection: Condom    Comment: accepted condoms today  Other Topics Concern  . Not on file  Social History Narrative  . Not on file   Social Determinants of Health   Financial Resource Strain:   . Difficulty of Paying Living Expenses:   Food Insecurity:   . Worried About Charity fundraiser in the Last Year:   . Arboriculturist in the Last Year:   Transportation Needs:   . Film/video editor (Medical):   Marland Kitchen Lack of Transportation (Non-Medical):   Physical Activity:   . Days of Exercise per Week:   . Minutes of Exercise per Session:   Stress:   . Feeling of Stress :   Social Connections:   . Frequency of Communication with Friends and Family:   . Frequency of Social Gatherings with Friends and Family:   .  Attends Religious Services:   . Active Member of Clubs or Organizations:   . Attends Archivist Meetings:   Marland Kitchen Marital Status:   Intimate Partner Violence:   . Fear of Current or Ex-Partner:   . Emotionally Abused:   Marland Kitchen Physically Abused:   . Sexually Abused:     Family History  Problem Relation Age of Onset  . Diverticulitis Mother   . Heart disease Mother   . Atrial fibrillation Mother   . Heart disease Father        CABG  . Colon cancer Brother     ROS: no fevers or chills, productive cough, hemoptysis, dysphasia, odynophagia, melena, hematochezia, dysuria, hematuria, rash, seizure activity, orthopnea, PND, pedal edema, claudication. Remaining systems are negative.  Physical  Exam: Well-developed well-nourished in no acute distress.  Skin is warm and dry.  HEENT is normal.  Neck is supple.  Chest is clear to auscultation with normal expansion.  Cardiovascular exam is regular rate and rhythm.  Abdominal exam nontender or distended. No masses palpated. Extremities show no edema. neuro grossly intact  ECG-normal sinus rhythm at a rate of 78, inferolateral ST T wave changes.  Personally reviewed  A/P  1 hypertension-patient's blood pressure is elevated; increase amlodipine to 10 mg daily and follow.  2 hyperlipidemia-continue statin.  3 HIV-Per primary care.  Kirk Ruths, MD

## 2019-08-25 ENCOUNTER — Other Ambulatory Visit (HOSPITAL_COMMUNITY)
Admission: RE | Admit: 2019-08-25 | Discharge: 2019-08-25 | Disposition: A | Discharge status: Home / Self Care | Payer: Medicare HMO | Source: Ambulatory Visit | Attending: Infectious Diseases | Admitting: Infectious Diseases

## 2019-08-25 ENCOUNTER — Other Ambulatory Visit: Payer: Self-pay

## 2019-08-25 ENCOUNTER — Other Ambulatory Visit: Payer: Medicare HMO

## 2019-08-25 DIAGNOSIS — Z113 Encounter for screening for infections with a predominantly sexual mode of transmission: Secondary | ICD-10-CM | POA: Insufficient documentation

## 2019-08-25 DIAGNOSIS — Z79899 Other long term (current) drug therapy: Secondary | ICD-10-CM

## 2019-08-25 DIAGNOSIS — B20 Human immunodeficiency virus [HIV] disease: Secondary | ICD-10-CM

## 2019-08-25 DIAGNOSIS — E7800 Pure hypercholesterolemia, unspecified: Secondary | ICD-10-CM

## 2019-08-26 LAB — URINE CYTOLOGY ANCILLARY ONLY
Chlamydia: NEGATIVE
Comment: NEGATIVE
Comment: NORMAL
Neisseria Gonorrhea: NEGATIVE

## 2019-08-26 LAB — T-HELPER CELL (CD4) - (RCID CLINIC ONLY)
CD4 % Helper T Cell: 46 % (ref 33–65)
CD4 T Cell Abs: 530 /uL (ref 400–1790)

## 2019-08-28 LAB — COMPREHENSIVE METABOLIC PANEL
AG Ratio: 2 (calc) (ref 1.0–2.5)
ALT: 29 U/L (ref 9–46)
AST: 21 U/L (ref 10–35)
Albumin: 4.8 g/dL (ref 3.6–5.1)
Alkaline phosphatase (APISO): 53 U/L (ref 35–144)
BUN/Creatinine Ratio: 9 (calc) (ref 6–22)
BUN: 14 mg/dL (ref 7–25)
CO2: 24 mmol/L (ref 20–32)
Calcium: 9.6 mg/dL (ref 8.6–10.3)
Chloride: 104 mmol/L (ref 98–110)
Creat: 1.54 mg/dL — ABNORMAL HIGH (ref 0.70–1.25)
Globulin: 2.4 g/dL (calc) (ref 1.9–3.7)
Glucose, Bld: 92 mg/dL (ref 65–99)
Potassium: 4.1 mmol/L (ref 3.5–5.3)
Sodium: 138 mmol/L (ref 135–146)
Total Bilirubin: 0.7 mg/dL (ref 0.2–1.2)
Total Protein: 7.2 g/dL (ref 6.1–8.1)

## 2019-08-28 LAB — CBC
HCT: 44 % (ref 38.5–50.0)
Hemoglobin: 15.1 g/dL (ref 13.2–17.1)
MCH: 30.8 pg (ref 27.0–33.0)
MCHC: 34.3 g/dL (ref 32.0–36.0)
MCV: 89.8 fL (ref 80.0–100.0)
MPV: 10 fL (ref 7.5–12.5)
Platelets: 201 10*3/uL (ref 140–400)
RBC: 4.9 10*6/uL (ref 4.20–5.80)
RDW: 13.6 % (ref 11.0–15.0)
WBC: 5 10*3/uL (ref 3.8–10.8)

## 2019-08-28 LAB — RPR: RPR Ser Ql: NONREACTIVE

## 2019-08-28 LAB — LIPID PANEL
Cholesterol: 182 mg/dL (ref <200)
HDL: 35 mg/dL — ABNORMAL LOW (ref >=40)
LDL Cholesterol (Calc): 95 mg/dL (calc)
Non-HDL Cholesterol (Calc): 147 mg/dL (calc) — ABNORMAL HIGH (ref <130)
Total CHOL/HDL Ratio: 5.2 (calc) — ABNORMAL HIGH (ref <5.0)
Triglycerides: 390 mg/dL — ABNORMAL HIGH (ref <150)

## 2019-08-28 LAB — HIV-1 RNA QUANT-NO REFLEX-BLD
HIV 1 RNA Quant: <20 copies/mL
HIV-1 RNA Quant, Log: <1.3 Log copies/mL

## 2019-08-31 ENCOUNTER — Ambulatory Visit (INDEPENDENT_AMBULATORY_CARE_PROVIDER_SITE_OTHER): Payer: Medicare HMO | Admitting: Cardiology

## 2019-08-31 ENCOUNTER — Encounter: Payer: Self-pay | Admitting: Cardiology

## 2019-08-31 ENCOUNTER — Other Ambulatory Visit: Payer: Self-pay

## 2019-08-31 VITALS — BP 147/87 | HR 80 | Ht 68.0 in | Wt 213.0 lb

## 2019-08-31 DIAGNOSIS — I10 Essential (primary) hypertension: Secondary | ICD-10-CM

## 2019-08-31 DIAGNOSIS — E7800 Pure hypercholesterolemia, unspecified: Secondary | ICD-10-CM | POA: Diagnosis not present

## 2019-08-31 MED ORDER — AMLODIPINE BESYLATE 10 MG PO TABS: 10.0000 mg | ORAL_TABLET | Freq: Every day | ORAL | 3 refills | Status: DC

## 2019-08-31 NOTE — Patient Instructions (Signed)
Medication Instructions:   INCREASE AMLODIPINE TO 10 MG ONCE DAILY= 2 OF THE 5 MG TABLETS ONCE DAILY  *If you need a refill on your cardiac medications before your next appointment, please call your pharmacy*   Lab Work: If you have labs (blood work) drawn today and your tests are completely normal, you will receive your results only by: Marland Kitchen MyChart Message (if you have MyChart) OR . A paper copy in the mail If you have any lab test that is abnormal or we need to change your treatment, we will call you to review the results.  Follow-Up: At [facility], you and your health needs are our priority.  As part of our continuing mission to provide you with exceptional heart care, we have created designated Provider Care Teams.  These Care Teams include your primary Cardiologist (physician) and Advanced Practice Providers (APPs -  Physician Assistants and Nurse Practitioners) who all work together to provide you with the care you need, when you need it.  We recommend signing up for the patient portal called "MyChart".  Sign up information is provided on this After Visit Summary.  MyChart is used to connect with patients for Virtual Visits (Telemedicine).  Patients are able to view lab/test results, encounter notes, upcoming appointments, etc.  Non-urgent messages can be sent to your provider as well.   To learn more about what you can do with MyChart, go to [email or web address].    Your next appointment:   12 month(s)  The format for your next appointment:   In Person  Provider:   Kirk Ruths, MD

## 2019-09-08 ENCOUNTER — Ambulatory Visit: Payer: Medicare HMO

## 2019-09-08 ENCOUNTER — Encounter: Payer: Self-pay | Admitting: Infectious Diseases

## 2019-09-08 ENCOUNTER — Other Ambulatory Visit: Payer: Self-pay

## 2019-09-08 ENCOUNTER — Ambulatory Visit (INDEPENDENT_AMBULATORY_CARE_PROVIDER_SITE_OTHER): Payer: Medicare HMO | Admitting: Infectious Diseases

## 2019-09-08 VITALS — BP 160/79 | HR 79 | Temp 98.6°F | Wt 217.0 lb

## 2019-09-08 DIAGNOSIS — F1020 Alcohol dependence, uncomplicated: Secondary | ICD-10-CM | POA: Diagnosis not present

## 2019-09-08 DIAGNOSIS — N1831 Chronic kidney disease, stage 3a: Secondary | ICD-10-CM

## 2019-09-08 DIAGNOSIS — N183 Chronic kidney disease, stage 3 unspecified: Secondary | ICD-10-CM | POA: Insufficient documentation

## 2019-09-08 DIAGNOSIS — Z113 Encounter for screening for infections with a predominantly sexual mode of transmission: Secondary | ICD-10-CM

## 2019-09-08 DIAGNOSIS — B20 Human immunodeficiency virus [HIV] disease: Secondary | ICD-10-CM

## 2019-09-08 DIAGNOSIS — I10 Essential (primary) hypertension: Secondary | ICD-10-CM

## 2019-09-08 DIAGNOSIS — Z79899 Other long term (current) drug therapy: Secondary | ICD-10-CM

## 2019-09-08 NOTE — Assessment & Plan Note (Signed)
Encouraged to cut back

## 2019-09-08 NOTE — Progress Notes (Signed)
   Subjective:    Patient ID: Brandon Joyce, male    DOB: [birth year], 69 y.o.   MRN: [id number]  HPI 69yo M with HIV+, statin related myalgias, hyperlipidemia, erectile dysfunction, hypertension, ETOH use.  He had cardiac cath 07-2013 that showed non-obstr disease. Had surgery on 04-27-15 shoulder due to reported work related injury.This has resolved.  Had another work related injury fall 2018, C6 pinched nerve. He had a nerve block which helped for ~ 48h.He had c-spine surgery 10-23-17. He is out of work.  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 Had hearing test done. Was told there was no tx for it.  He continues to have this.   Got penoplasty 07-2017 for ED.  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  Wife is WC bound. Now has small bed-sore. Has home health nurse helping. Getting better.  His Cr has been increasing slowly over the last 4 years.   Aware he needs to lose wt, exercise more.   HIV 1 RNA Quant (copies/mL)  Date Value  08/25/2019 <20 NOT DETECTED  11/11/2018 <20 NOT DETECTED  01/26/2018 <20 NOT DETECTED   CD4 T Cell Abs (/uL)  Date Value  08/25/2019 530  01/26/2018 510  04/08/2017 490    Review of Systems  Constitutional: Negative for appetite change, chills, fever and unexpected weight change.  HENT: Positive for tinnitus.   Respiratory: Negative for cough and shortness of breath.   Cardiovascular: Negative for chest pain.  Gastrointestinal: Negative for constipation and diarrhea.  Genitourinary: Negative for difficulty urinating.  Neurological: Negative for headaches.       Objective:   Physical Exam Vitals reviewed.  Constitutional:      Appearance: Normal appearance. He is obese.  HENT:     Mouth/Throat:     Mouth: Mucous membranes are moist.     Pharynx: No oropharyngeal exudate.  Eyes:     Extraocular Movements: Extraocular movements intact.     Pupils: Pupils are equal, round, and reactive to light.  Cardiovascular:     Rate and Rhythm: Normal  rate and regular rhythm.  Pulmonary:     Effort: Pulmonary effort is normal.     Breath sounds: Normal breath sounds.  Abdominal:     General: Bowel sounds are normal. There is no distension.     Palpations: Abdomen is soft.     Tenderness: There is no abdominal tenderness.  Musculoskeletal:        General: Normal range of motion.     Cervical back: Normal range of motion and neck supple.     Right lower leg: No edema.     Left lower leg: No edema.  Neurological:     General: No focal deficit present.     Mental Status: He is alert.  Psychiatric:        Mood and Affect: Mood normal.           Assessment & Plan:

## 2019-09-08 NOTE — Assessment & Plan Note (Signed)
He is asx Will f/u with PCP and CV.

## 2019-09-08 NOTE — Assessment & Plan Note (Signed)
Suspect related to lisinopril.  Will have renal see him.

## 2019-09-08 NOTE — Assessment & Plan Note (Signed)
He is doing well Will have renal see him.  He does not want COVID vax Other vax uptodate Offered/refused condoms.  rtc in 9 months.

## 2019-09-09 ENCOUNTER — Encounter: Payer: Self-pay | Admitting: Infectious Diseases

## 2019-09-23 ENCOUNTER — Telehealth: Payer: Self-pay | Admitting: Cardiology

## 2019-09-23 DIAGNOSIS — I10 Essential (primary) hypertension: Secondary | ICD-10-CM

## 2019-09-23 MED ORDER — HYDRALAZINE HCL 25 MG PO TABS: 25.0000 mg | ORAL_TABLET | Freq: Three times a day (TID) | ORAL | 3 refills | Status: DC

## 2019-09-23 MED ORDER — AMLODIPINE BESYLATE 10 MG PO TABS: 5.0000 mg | ORAL_TABLET | Freq: Every day | ORAL | 3 refills | Status: DC

## 2019-09-23 NOTE — Telephone Encounter (Signed)
Lower extremity edema may be secondary to increased dose of amlodipine.  Decrease to 5 mg daily.  Add hydralazine 25 mg p.o. 3 times daily.  Follow blood pressure and adjust regimen as needed. Kirk Ruths

## 2019-09-23 NOTE — Telephone Encounter (Signed)
Pt c/o swelling: STAT is pt has developed SOB within 24 hours  1) How much weight have you gained and in what time span? No weight gain  2) If swelling, where is the swelling located? Legs and feet (noticeable blood in toes on right foot), per patient's wife  3) Are you currently taking a fluid pill? Yes  4) Are you currently SOB? No  5) Do you have a log of your daily weights (if so, list)? No  6) Have you gained 3 pounds in a day or 5 pounds in a week? No  7) Have you traveled recently? No

## 2019-09-23 NOTE — Telephone Encounter (Signed)
Spoke with pt, questions answered.

## 2019-09-23 NOTE — Telephone Encounter (Signed)
Follow up   Patient is calling back to be certain that the new medication can be taken with his existing medication. Please call.

## 2019-09-23 NOTE — Telephone Encounter (Signed)
Spoke with patient and he has been having swelling from knees down for the last 3-4 days, started on left side 2 of his toes on right foot are looking purple like blood pooling underneath Swelling does not go down at night Has been having shortness of breath on exertion for about 5-6 days  Blood pressure running 140's/80's When he was seen in 2020 he was prescribed Amlodipine 5 mg daily but never started Office visit 7/7 Amlodipine increased to 10 mg daily, he started and stopped Lisinopril Will forward to Dr Stanford Breed for review

## 2019-09-23 NOTE — Telephone Encounter (Signed)
Spoke with pt, Aware of dr crenshaw's recommendations. New script sent to the pharmacy  

## 2019-09-23 NOTE — Addendum Note (Signed)
Addended by: Alvina Filbert B on: 09/23/2019 12:11 PM   Modules accepted: Orders

## 2019-10-05 ENCOUNTER — Other Ambulatory Visit: Payer: Self-pay | Admitting: *Deleted

## 2019-10-05 DIAGNOSIS — B20 Human immunodeficiency virus [HIV] disease: Secondary | ICD-10-CM

## 2019-10-05 MED ORDER — BIKTARVY 50-200-25 MG PO TABS: 1.0000 | ORAL_TABLET | Freq: Every day | ORAL | 5 refills | Status: DC

## 2019-10-26 ENCOUNTER — Other Ambulatory Visit: Payer: Self-pay | Admitting: Internal Medicine

## 2019-10-26 DIAGNOSIS — N1831 Chronic kidney disease, stage 3a: Secondary | ICD-10-CM

## 2019-10-26 DIAGNOSIS — I129 Hypertensive chronic kidney disease with stage 1 through stage 4 chronic kidney disease, or unspecified chronic kidney disease: Secondary | ICD-10-CM

## 2019-10-27 ENCOUNTER — Ambulatory Visit
Admission: RE | Admit: 2019-10-27 | Discharge: 2019-10-27 | Disposition: A | Discharge status: Home / Self Care | Payer: Medicare HMO | Source: Ambulatory Visit | Attending: Internal Medicine | Admitting: Internal Medicine

## 2019-10-27 DIAGNOSIS — I129 Hypertensive chronic kidney disease with stage 1 through stage 4 chronic kidney disease, or unspecified chronic kidney disease: Secondary | ICD-10-CM

## 2019-10-27 DIAGNOSIS — N1831 Chronic kidney disease, stage 3a: Secondary | ICD-10-CM

## 2019-11-08 ENCOUNTER — Other Ambulatory Visit: Payer: Self-pay | Admitting: Internal Medicine

## 2019-11-08 DIAGNOSIS — N1831 Chronic kidney disease, stage 3a: Secondary | ICD-10-CM

## 2019-11-15 DIAGNOSIS — M961 Postlaminectomy syndrome, not elsewhere classified: Secondary | ICD-10-CM | POA: Insufficient documentation

## 2019-11-16 ENCOUNTER — Other Ambulatory Visit: Payer: Self-pay | Admitting: Internal Medicine

## 2019-11-22 ENCOUNTER — Ambulatory Visit: Payer: Self-pay | Admitting: General Surgery

## 2019-11-24 ENCOUNTER — Other Ambulatory Visit: Payer: Self-pay | Admitting: Internal Medicine

## 2019-11-28 ENCOUNTER — Inpatient Hospital Stay: Admission: RE | Admit: 2019-11-28 | Payer: Medicare HMO | Source: Ambulatory Visit

## 2019-11-30 ENCOUNTER — Other Ambulatory Visit: Payer: Self-pay

## 2019-11-30 ENCOUNTER — Encounter (HOSPITAL_BASED_OUTPATIENT_CLINIC_OR_DEPARTMENT_OTHER): Payer: Self-pay | Admitting: General Surgery

## 2019-12-03 ENCOUNTER — Other Ambulatory Visit (HOSPITAL_COMMUNITY)
Admission: RE | Admit: 2019-12-03 | Discharge: 2019-12-03 | Disposition: A | Discharge status: Home / Self Care | Payer: Medicare HMO | Source: Ambulatory Visit | Attending: General Surgery | Admitting: General Surgery

## 2019-12-03 DIAGNOSIS — Z20822 Contact with and (suspected) exposure to covid-19: Secondary | ICD-10-CM | POA: Diagnosis not present

## 2019-12-03 DIAGNOSIS — Z01812 Encounter for preprocedural laboratory examination: Secondary | ICD-10-CM | POA: Diagnosis present

## 2019-12-03 LAB — SARS CORONAVIRUS 2 (TAT 6-24 HRS): SARS Coronavirus 2: NEGATIVE

## 2019-12-05 ENCOUNTER — Encounter (HOSPITAL_BASED_OUTPATIENT_CLINIC_OR_DEPARTMENT_OTHER)
Admission: RE | Admit: 2019-12-05 | Discharge: 2019-12-05 | Disposition: A | Discharge status: Home / Self Care | Payer: Medicare HMO | Source: Ambulatory Visit | Attending: General Surgery | Admitting: General Surgery

## 2019-12-05 DIAGNOSIS — Z01812 Encounter for preprocedural laboratory examination: Secondary | ICD-10-CM | POA: Diagnosis present

## 2019-12-05 LAB — BASIC METABOLIC PANEL
Anion gap: 9 (ref 5–15)
BUN: 13 mg/dL (ref 8–23)
CO2: 21 mmol/L — ABNORMAL LOW (ref 22–32)
Calcium: 9.3 mg/dL (ref 8.9–10.3)
Chloride: 106 mmol/L (ref 98–111)
Creatinine, Ser: 1.27 mg/dL — ABNORMAL HIGH (ref 0.61–1.24)
GFR, Estimated: 57 mL/min — ABNORMAL LOW (ref >60)
Glucose, Bld: 83 mg/dL (ref 70–99)
Potassium: 4.8 mmol/L (ref 3.5–5.1)
Sodium: 136 mmol/L (ref 135–145)

## 2019-12-05 NOTE — Progress Notes (Signed)

## 2019-12-06 NOTE — Anesthesia Preprocedure Evaluation (Addendum)
Anesthesia Evaluation  Patient identified by MRN, date of birth, ID band Patient awake    Reviewed: Allergy & Precautions, H&P , NPO status , Patient's Chart, lab work & pertinent test results  Airway Mallampati: I  TM Distance: >3 FB Neck ROM: Full    Dental no notable dental hx. (+) Teeth Intact, Poor Dentition, Dental Advisory Given, Missing   Pulmonary neg pulmonary ROS,    Pulmonary exam normal breath sounds clear to auscultation       Cardiovascular Exercise Tolerance: Good hypertension, Pt. on medications Normal cardiovascular exam Rhythm:Regular Rate:Normal     Neuro/Psych PSYCHIATRIC DISORDERS Depression negative neurological ROS     GI/Hepatic negative GI ROS, Neg liver ROS, (+)     substance abuse  ,   Endo/Other  negative endocrine ROS  Renal/GU negative Renal ROS  negative genitourinary   Musculoskeletal negative musculoskeletal ROS (+)   Abdominal   Peds negative pediatric ROS (+)  Hematology negative hematology ROS (+)   Anesthesia Other Findings   Reproductive/Obstetrics negative OB ROS                            Anesthesia Physical Anesthesia Plan  ASA: II  Anesthesia Plan: General   Post-op Pain Management:    Induction: Intravenous  PONV Risk Score and Plan: 2 and Ondansetron, Dexamethasone and Treatment may vary due to age or medical condition  Airway Management Planned: Oral ETT and LMA  Additional Equipment:   Intra-op Plan:   Post-operative Plan:   Informed Consent: I have reviewed the patients History and Physical, chart, labs and discussed the procedure including the risks, benefits and alternatives for the proposed anesthesia with the patient or authorized representative who has indicated his/her understanding and acceptance.       Plan Discussed with: Anesthesiologist and CRNA  Anesthesia Plan Comments: ( )        Anesthesia Quick  Evaluation

## 2019-12-07 ENCOUNTER — Encounter (HOSPITAL_BASED_OUTPATIENT_CLINIC_OR_DEPARTMENT_OTHER)
Admission: RE | Disposition: A | Discharge status: Home / Self Care | Payer: Self-pay | Source: Home / Self Care | Attending: General Surgery

## 2019-12-07 ENCOUNTER — Other Ambulatory Visit: Payer: Self-pay

## 2019-12-07 ENCOUNTER — Ambulatory Visit (HOSPITAL_BASED_OUTPATIENT_CLINIC_OR_DEPARTMENT_OTHER): Payer: Medicare HMO | Admitting: Anesthesiology

## 2019-12-07 ENCOUNTER — Ambulatory Visit (HOSPITAL_BASED_OUTPATIENT_CLINIC_OR_DEPARTMENT_OTHER)
Admission: RE | Admit: 2019-12-07 | Discharge: 2019-12-07 | Disposition: A | Discharge status: Home / Self Care | Payer: Medicare HMO | Attending: General Surgery | Admitting: General Surgery

## 2019-12-07 ENCOUNTER — Encounter (HOSPITAL_BASED_OUTPATIENT_CLINIC_OR_DEPARTMENT_OTHER): Payer: Self-pay | Admitting: General Surgery

## 2019-12-07 DIAGNOSIS — Z7982 Long term (current) use of aspirin: Secondary | ICD-10-CM | POA: Diagnosis not present

## 2019-12-07 DIAGNOSIS — D049 Carcinoma in situ of skin, unspecified: Secondary | ICD-10-CM | POA: Insufficient documentation

## 2019-12-07 DIAGNOSIS — I10 Essential (primary) hypertension: Secondary | ICD-10-CM | POA: Diagnosis not present

## 2019-12-07 DIAGNOSIS — C495 Malignant neoplasm of connective and soft tissue of pelvis: Secondary | ICD-10-CM | POA: Diagnosis not present

## 2019-12-07 DIAGNOSIS — Z79899 Other long term (current) drug therapy: Secondary | ICD-10-CM | POA: Insufficient documentation

## 2019-12-07 DIAGNOSIS — L0231 Cutaneous abscess of buttock: Secondary | ICD-10-CM | POA: Insufficient documentation

## 2019-12-07 HISTORY — DX: Chronic kidney disease, unspecified: N18.9

## 2019-12-07 HISTORY — PX: INCISION AND DRAINAGE ABSCESS: SHX5864

## 2019-12-07 SURGERY — INCISION AND DRAINAGE, ABSCESS
Anesthesia: General | Site: Buttocks

## 2019-12-07 MED ORDER — ONDANSETRON HCL 4 MG/2ML IJ SOLN: 4.0000 mg | Freq: Once | INTRAMUSCULAR | Status: DC | PRN

## 2019-12-07 MED ORDER — LIDOCAINE 2% (20 MG/ML) 5 ML SYRINGE
INTRAMUSCULAR | Status: DC | PRN
  Administered 2019-12-07: 80 mg via INTRAVENOUS

## 2019-12-07 MED ORDER — MIDAZOLAM HCL 5 MG/5ML IJ SOLN
INTRAMUSCULAR | Status: DC | PRN
  Administered 2019-12-07: 2 mg via INTRAVENOUS

## 2019-12-07 MED ORDER — CHLORHEXIDINE GLUCONATE CLOTH 2 % EX PADS: 6.0000 | MEDICATED_PAD | Freq: Once | CUTANEOUS | Status: DC

## 2019-12-07 MED ORDER — LACTATED RINGERS IV SOLN: INTRAVENOUS | Status: DC

## 2019-12-07 MED ORDER — EPHEDRINE 5 MG/ML INJ
INTRAVENOUS | Status: AC
  Filled 2019-12-07: qty 10

## 2019-12-07 MED ORDER — FENTANYL CITRATE (PF) 100 MCG/2ML IJ SOLN
INTRAMUSCULAR | Status: DC | PRN
Start: 2019-12-07 — End: 2019-12-07
  Administered 2019-12-07: 50 ug via INTRAVENOUS

## 2019-12-07 MED ORDER — HYDROCODONE-ACETAMINOPHEN 5-325 MG PO TABS: 1.0000 | ORAL_TABLET | Freq: Four times a day (QID) | ORAL | 0 refills | Status: DC | PRN

## 2019-12-07 MED ORDER — PROPOFOL 10 MG/ML IV BOLUS
INTRAVENOUS | Status: AC
  Filled 2019-12-07: qty 40

## 2019-12-07 MED ORDER — SUGAMMADEX SODIUM 200 MG/2ML IV SOLN
INTRAVENOUS | Status: DC | PRN
  Administered 2019-12-07: 200 mg via INTRAVENOUS

## 2019-12-07 MED ORDER — LIDOCAINE 2% (20 MG/ML) 5 ML SYRINGE
INTRAMUSCULAR | Status: AC
  Filled 2019-12-07: qty 5

## 2019-12-07 MED ORDER — EPHEDRINE SULFATE 50 MG/ML IJ SOLN
INTRAMUSCULAR | Status: DC | PRN
  Administered 2019-12-07 (×2): 10 mg via INTRAVENOUS

## 2019-12-07 MED ORDER — OXYCODONE HCL 5 MG/5ML PO SOLN: 5.0000 mg | Freq: Once | ORAL | Status: DC | PRN

## 2019-12-07 MED ORDER — DEXAMETHASONE SODIUM PHOSPHATE 10 MG/ML IJ SOLN
INTRAMUSCULAR | Status: AC
  Filled 2019-12-07: qty 1

## 2019-12-07 MED ORDER — ROCURONIUM BROMIDE 10 MG/ML (PF) SYRINGE
PREFILLED_SYRINGE | INTRAVENOUS | Status: AC
  Filled 2019-12-07: qty 10

## 2019-12-07 MED ORDER — CEFAZOLIN SODIUM-DEXTROSE 2-4 GM/100ML-% IV SOLN
2.0000 g | INTRAVENOUS | Status: AC
  Administered 2019-12-07: 2 g via INTRAVENOUS

## 2019-12-07 MED ORDER — PROPOFOL 10 MG/ML IV BOLUS
INTRAVENOUS | Status: DC | PRN
  Administered 2019-12-07: 200 mg via INTRAVENOUS

## 2019-12-07 MED ORDER — ONDANSETRON HCL 4 MG/2ML IJ SOLN
INTRAMUSCULAR | Status: DC | PRN
  Administered 2019-12-07: 4 mg via INTRAVENOUS

## 2019-12-07 MED ORDER — FENTANYL CITRATE (PF) 100 MCG/2ML IJ SOLN: 25.0000 ug | INTRAMUSCULAR | Status: DC | PRN

## 2019-12-07 MED ORDER — ONDANSETRON HCL 4 MG/2ML IJ SOLN
INTRAMUSCULAR | Status: AC
  Filled 2019-12-07: qty 2

## 2019-12-07 MED ORDER — ROCURONIUM BROMIDE 100 MG/10ML IV SOLN
INTRAVENOUS | Status: DC | PRN
  Administered 2019-12-07: 40 mg via INTRAVENOUS

## 2019-12-07 MED ORDER — GABAPENTIN 300 MG PO CAPS
300.0000 mg | ORAL_CAPSULE | ORAL | Status: AC
  Administered 2019-12-07: 300 mg via ORAL

## 2019-12-07 MED ORDER — ACETAMINOPHEN 325 MG PO TABS: 325.0000 mg | ORAL_TABLET | ORAL | Status: DC | PRN

## 2019-12-07 MED ORDER — MEPERIDINE HCL 25 MG/ML IJ SOLN: 6.2500 mg | INTRAMUSCULAR | Status: DC | PRN

## 2019-12-07 MED ORDER — OXYCODONE HCL 5 MG PO TABS: 5.0000 mg | ORAL_TABLET | Freq: Once | ORAL | Status: DC | PRN

## 2019-12-07 MED ORDER — ACETAMINOPHEN 500 MG PO TABS
1000.0000 mg | ORAL_TABLET | ORAL | Status: AC
  Administered 2019-12-07: 1000 mg via ORAL

## 2019-12-07 MED ORDER — GABAPENTIN 300 MG PO CAPS
ORAL_CAPSULE | ORAL | Status: AC
  Filled 2019-12-07: qty 1

## 2019-12-07 MED ORDER — MIDAZOLAM HCL 2 MG/2ML IJ SOLN
INTRAMUSCULAR | Status: AC
  Filled 2019-12-07: qty 2

## 2019-12-07 MED ORDER — FENTANYL CITRATE (PF) 100 MCG/2ML IJ SOLN
INTRAMUSCULAR | Status: AC
  Filled 2019-12-07: qty 2

## 2019-12-07 MED ORDER — ACETAMINOPHEN 500 MG PO TABS
ORAL_TABLET | ORAL | Status: AC
  Filled 2019-12-07: qty 2

## 2019-12-07 MED ORDER — DEXAMETHASONE SODIUM PHOSPHATE 10 MG/ML IJ SOLN
INTRAMUSCULAR | Status: DC | PRN
  Administered 2019-12-07: 5 mg via INTRAVENOUS

## 2019-12-07 MED ORDER — CEFAZOLIN SODIUM-DEXTROSE 2-4 GM/100ML-% IV SOLN
INTRAVENOUS | Status: AC
  Filled 2019-12-07: qty 100

## 2019-12-07 MED ORDER — ACETAMINOPHEN 160 MG/5ML PO SOLN: 325.0000 mg | ORAL | Status: DC | PRN

## 2019-12-07 SURGICAL SUPPLY — 46 items
APL PRP STRL LF DISP 70% ISPRP (MISCELLANEOUS) ×1
APL SKNCLS STERI-STRIP NONHPOA (GAUZE/BANDAGES/DRESSINGS) ×2
BENZOIN TINCTURE PRP APPL 2/3 (GAUZE/BANDAGES/DRESSINGS) ×4 IMPLANT
BLADE CLIPPER SURG (BLADE) ×2 IMPLANT
BLADE SURG 15 STRL LF DISP TIS (BLADE) ×1 IMPLANT
BLADE SURG 15 STRL SS (BLADE) ×2
BNDG GAUZE ELAST 4 BULKY (GAUZE/BANDAGES/DRESSINGS) IMPLANT
CANISTER SUCT 1200ML W/VALVE (MISCELLANEOUS) ×2 IMPLANT
CHLORAPREP W/TINT 26 (MISCELLANEOUS) ×2 IMPLANT
CLEANER CAUTERY TIP 5X5 PAD (MISCELLANEOUS) ×1 IMPLANT
COVER BACK TABLE 60X90IN (DRAPES) ×2 IMPLANT
COVER MAYO STAND STRL (DRAPES) ×2 IMPLANT
COVER WAND RF STERILE (DRAPES) IMPLANT
DECANTER SPIKE VIAL GLASS SM (MISCELLANEOUS) IMPLANT
DRAPE LAPAROTOMY T 102X78X121 (DRAPES) ×2 IMPLANT
DRAPE UTILITY XL STRL (DRAPES) ×2 IMPLANT
DRSG PAD ABDOMINAL 8X10 ST (GAUZE/BANDAGES/DRESSINGS) IMPLANT
DRSG TEGADERM 4X4.75 (GAUZE/BANDAGES/DRESSINGS) ×1 IMPLANT
ELECT REM PT RETURN 9FT ADLT (ELECTROSURGICAL) ×2
ELECTRODE REM PT RTRN 9FT ADLT (ELECTROSURGICAL) ×1 IMPLANT
GAUZE 4X4 16PLY RFD (DISPOSABLE) IMPLANT
GAUZE SPONGE 4X4 12PLY STRL LF (GAUZE/BANDAGES/DRESSINGS) ×4 IMPLANT
GLOVE BIO SURGEON STRL SZ7.5 (GLOVE) ×2 IMPLANT
GLOVE BIOGEL M 6.5 STRL (GLOVE) ×1 IMPLANT
GLOVE BIOGEL PI IND STRL 6.5 (GLOVE) IMPLANT
GLOVE BIOGEL PI IND STRL 7.5 (GLOVE) IMPLANT
GLOVE BIOGEL PI INDICATOR 6.5 (GLOVE) ×1
GLOVE BIOGEL PI INDICATOR 7.5 (GLOVE) ×1
GOWN STRL REUS W/ TWL LRG LVL3 (GOWN DISPOSABLE) ×2 IMPLANT
GOWN STRL REUS W/TWL LRG LVL3 (GOWN DISPOSABLE) ×4
NDL HYPO 25X1 1.5 SAFETY (NEEDLE) ×1 IMPLANT
NEEDLE HYPO 25X1 1.5 SAFETY (NEEDLE) ×2 IMPLANT
NS IRRIG 1000ML POUR BTL (IV SOLUTION) IMPLANT
PACK BASIN DAY SURGERY FS (CUSTOM PROCEDURE TRAY) ×2 IMPLANT
PAD CLEANER CAUTERY TIP 5X5 (MISCELLANEOUS) ×1
PENCIL SMOKE EVACUATOR (MISCELLANEOUS) ×2 IMPLANT
SPONGE LAP 18X18 RF (DISPOSABLE) IMPLANT
STRIP CLOSURE SKIN 1/2X4 (GAUZE/BANDAGES/DRESSINGS) IMPLANT
SUT PROLENE 3 0 PS 2 (SUTURE) IMPLANT
SUT PROLENE 4 0 PS 2 18 (SUTURE) IMPLANT
SUT VICRYL 3-0 CR8 SH (SUTURE) IMPLANT
SYR CONTROL 10ML LL (SYRINGE) ×2 IMPLANT
TAPE CLOTH 3X10 TAN LF (GAUZE/BANDAGES/DRESSINGS) ×2 IMPLANT
TOWEL GREEN STERILE FF (TOWEL DISPOSABLE) ×4 IMPLANT
TUBE CONNECTING 20X1/4 (TUBING) ×2 IMPLANT
YANKAUER SUCT BULB TIP NO VENT (SUCTIONS) ×2 IMPLANT

## 2019-12-07 NOTE — Anesthesia Procedure Notes (Signed)
Procedure Name: Intubation Date/Time: 12/07/2019 8:38 AM Performed by: Lavonia Dana, CRNA Pre-anesthesia Checklist: Patient identified, Emergency Drugs available, Suction available and Patient being monitored Patient Re-evaluated:Patient Re-evaluated prior to induction Oxygen Delivery Method: Circle system utilized Preoxygenation: Pre-oxygenation with 100% oxygen Induction Type: IV induction Ventilation: Mask ventilation without difficulty Laryngoscope Size: Mac and 4 Grade View: Grade I Tube type: Oral Tube size: 7.5 mm Number of attempts: 1 Airway Equipment and Method: Stylet and Bite block Placement Confirmation: ETT inserted through vocal cords under direct vision,  positive ETCO2 and breath sounds checked- equal and bilateral Secured at: 23 cm Tube secured with: Tape Dental Injury: Teeth and Oropharynx as per pre-operative assessment

## 2019-12-07 NOTE — Interval H&P Note (Signed)
History and Physical Interval Note:  12/07/2019 8:17 AM  Brandon Joyce  has presented today for surgery, with the diagnosis of buttock abscess.  The various methods of treatment have been discussed with the patient and family. After consideration of risks, benefits and other options for treatment, the patient has consented to  Procedure(s): INCISION AND DRAINAGE BUTTOCK ABSCESS (N/A) as a surgical intervention.  The patient's history has been reviewed, patient examined, no change in status, stable for surgery.  I have reviewed the patient's chart and labs.  Questions were answered to the patient's satisfaction.     Autumn Messing III

## 2019-12-07 NOTE — H&P (Signed)
Brandon Joyce  Location: [facility] Patient #: [id number] DOB: [birth year] Single / Language: [name] / Race: White Male   History of Present Illness  The patient is a 69 year old male who presents with a subcutaneous abscess. We are asked to see the patient in consultation by Dr. Magnus Sinning to evaluate him for a pilonidal abscess. The patient is a 69 year old white male who has had a spot on his left buttock for the last 3-4 months. He does have some discomfort associated with it. He denies any fevers or chills. He has had minimal drainage. He has been on antibiotics but has not resolved.   Medication History  amLODIPine Besylate (10MG  Tablet, Oral) Active. Atorvastatin Calcium (20MG  Tablet, Oral) Active. hydrALAZINE HCl (25MG  Tablet, Oral) Active. Biktarvy (50-200-25MG  Tablet, Oral) Active. Aspir-Low (81MG  Tablet DR, Oral) Active. Medications Reconciled    Review of Systems  General Not Present- Appetite Loss, Chills, Fatigue, Fever, Night Sweats, Weight Gain and Weight Loss. Note: All other systems negative (unless as noted in HPI & included Review of Systems) Skin Not Present- Change in Wart/Mole, Dryness, Hives, Jaundice, New Lesions, Non-Healing Wounds, Rash and Ulcer. HEENT Not Present- Earache, Hearing Loss, Hoarseness, Nose Bleed, Oral Ulcers, Ringing in the Ears, Seasonal Allergies, Sinus Pain, Sore Throat, Visual Disturbances, Wears glasses/contact lenses and Yellow Eyes. Respiratory Not Present- Bloody sputum, Chronic Cough, Difficulty Breathing, Snoring and Wheezing. Breast Not Present- Breast Mass, Breast Pain, Nipple Discharge and Skin Changes. Cardiovascular Not Present- Chest Pain, Difficulty Breathing Lying Down, Leg Cramps, Palpitations, Rapid Heart Rate, Shortness of Breath and Swelling of Extremities. Gastrointestinal Not Present- Abdominal Pain, Bloating, Bloody Stool, Change in Bowel Habits, Chronic diarrhea, Constipation, Difficulty  Swallowing, Excessive gas, Gets full quickly at meals, Hemorrhoids, Indigestion, Nausea, Rectal Pain and Vomiting. Male Genitourinary Not Present- Blood in Urine, Change in Urinary Stream, Frequency, Impotence, Nocturia, Painful Urination, Urgency and Urine Leakage. Musculoskeletal Not Present- Back Pain, Joint Pain, Joint Stiffness, Muscle Pain, Muscle Weakness and Swelling of Extremities. Neurological Not Present- Decreased Memory, Fainting, Headaches, Numbness, Seizures, Tingling, Tremor, Trouble walking and Weakness. Psychiatric Not Present- Anxiety, Bipolar, Change in Sleep Pattern, Depression, Fearful and Frequent crying. Endocrine Not Present- Cold Intolerance, Excessive Hunger, Hair Changes, Heat Intolerance and New Diabetes. Hematology Not Present- Easy Bruising, Excessive bleeding, Gland problems, HIV and Persistent Infections.  Vitals  Weight: 210.38 lb Height: 68in Body Surface Area: 2.09 m Body Mass Index: 31.99 kg/m  Temp.: 98.26F  Pulse: 91 (Regular)  P.OX: 93% (Room air) BP: 142/82(Sitting, Left Arm, Standard)       Physical Exam  General Mental Status-Alert. General Appearance-Consistent with stated age. Hydration-Well hydrated. Voice-Normal.  Head and Neck Head-normocephalic, atraumatic with no lesions or palpable masses. Trachea-midline. Thyroid Gland Characteristics - normal size and consistency.  Eye Eyeball - Bilateral-Extraocular movements intact. Sclera/Conjunctiva - Bilateral-No scleral icterus.  Chest and Lung Exam Chest and lung exam reveals -quiet, even and easy respiratory effort with no use of accessory muscles and on auscultation, normal breath sounds, no adventitious sounds and normal vocal resonance. Inspection Chest Wall - Normal. Back - normal.  Cardiovascular Cardiovascular examination reveals -normal heart sounds, regular rate and rhythm with no murmurs and normal pedal pulses  bilaterally.  Abdomen Inspection Inspection of the abdomen reveals - No Hernias. Skin - Scar - no surgical scars. Palpation/Percussion Palpation and Percussion of the abdomen reveal - Soft, Non Tender, No Rebound tenderness, No Rigidity (guarding) and No hepatosplenomegaly. Auscultation Auscultation of the abdomen reveals - Bowel sounds  normal.  Rectal Note: There is a raised area of induration in the left buttock high. It is several centimeters off the midline. There is no pitting of the skin in the gluteal cleft.   Neurologic Neurologic evaluation reveals -alert and oriented x 3 with no impairment of recent or remote memory. Mental Status-Normal.  Musculoskeletal Normal Exam - Left-Upper Extremity Strength Normal and Lower Extremity Strength Normal. Normal Exam - Right-Upper Extremity Strength Normal and Lower Extremity Strength Normal.  Lymphatic Head & Neck  General Head & Neck Lymphatics: Bilateral - Description - Normal. Axillary  General Axillary Region: Bilateral - Description - Normal. Tenderness - Non Tender. Femoral & Inguinal  Generalized Femoral & Inguinal Lymphatics: Bilateral - Description - Normal. Tenderness - Non Tender.    Assessment & Plan ABSCESS OF BUTTOCK, LEFT (L02.31) Impression: The patient appears to have a small abscess on the left buttock. I would recommend that he have this incised and drained. I think this would be best done in the operating room so that we can make sure the entire thing is drained well. I have discussed with him in detail the risks and benefits of the operation as well as some of the technical aspects including the likelihood that the wound will remain open and he understands and wishes to proceed. This patient encounter took 30 minutes today to perform the following: take history, perform exam, review outside records, interpret imaging, counsel the patient on their diagnosis and document encounter, findings & plan in the  EHR

## 2019-12-07 NOTE — Op Note (Signed)
12/07/2019  9:05 AM  PATIENT:  Brandon Joyce  69 y.o. male  PRE-OPERATIVE DIAGNOSIS: left buttock abscess  POST-OPERATIVE DIAGNOSIS:  Left buttock abscess  PROCEDURE:  Procedure(s): INCISION AND DRAINAGE BUTTOCK ABSCESS (N/A)  SURGEON:  Surgeon(s) and Role:    * Jovita Kussmaul, MD - Primary  PHYSICIAN ASSISTANT:   ASSISTANTS: none   ANESTHESIA:   local and general  EBL:  5 mL   BLOOD ADMINISTERED:none  DRAINS: none   LOCAL MEDICATIONS USED:  MARCAINE     SPECIMEN:  Source of Specimen:  wall of left buttock abscess  DISPOSITION OF SPECIMEN:  PATHOLOGY  COUNTS:  YES  TOURNIQUET:  * No tourniquets in log *  DICTATION: .Dragon Dictation   After informed consent was obtained the patient was brought to the operating room and placed in the supine position on the operating table.  After adequate induction of general anesthesia the patient was moved into a left side up lateral position on a beanbag and all pressure points were padded.  The left buttock was then prepped with Betadine and draped in usual sterile manner.  An appropriate timeout was performed.  The area around the abscess was infiltrated with quarter percent Marcaine.  The abscess itself was probed with a hemostat and the wall of the abscess was opened vertically so that the entire abscess cavity was easily drained.  The abscess cavity was actually fairly superficial.  The wall of the abscess cavity was excised sharply with the electrocautery and sent to pathology for further evaluation.  Hemostasis was then achieved using the Bovie electrocautery.  Once the wound was clean and dry it was then infiltrated with more quarter percent Marcaine.  The wound was packed with gauze and sterile dressings were applied.  The patient tolerated the procedure well.  At the end of the case all needle sponge and instrument counts were correct.  The patient was then awakened and taken to recovery in stable condition.  PLAN OF CARE:  Discharge to home after PACU  PATIENT DISPOSITION:  PACU - hemodynamically stable.   Delay start of Pharmacological VTE agent (>24hrs) due to surgical blood loss or risk of bleeding: not applicable

## 2019-12-07 NOTE — Discharge Instructions (Signed)

## 2019-12-07 NOTE — Anesthesia Postprocedure Evaluation (Signed)
Anesthesia Post Note  Patient: Brandon Joyce  Procedure(s) Performed: INCISION AND DRAINAGE BUTTOCK ABSCESS (N/A Buttocks)     Patient location during evaluation: PACU Anesthesia Type: General Level of consciousness: awake and alert Pain management: pain level controlled Vital Signs Assessment: post-procedure vital signs reviewed and stable Respiratory status: spontaneous breathing, nonlabored ventilation, respiratory function stable and patient connected to nasal cannula oxygen Cardiovascular status: blood pressure returned to baseline and stable Postop Assessment: no apparent nausea or vomiting Anesthetic complications: no   No complications documented.  Last Vitals:  Vitals:   12/07/19 0940 12/07/19 1005  BP:  136/88  Pulse: 70 67  Resp: 14 20  Temp:  36.6 C  SpO2: 95% 95%    Last Pain:  Vitals:   12/07/19 1005  TempSrc: Oral  PainSc: 0-No pain                 Kailey Esquilin

## 2019-12-07 NOTE — Transfer of Care (Signed)
Immediate Anesthesia Transfer of Care Note  Patient: Brandon Joyce  Procedure(s) Performed: INCISION AND DRAINAGE BUTTOCK ABSCESS (N/A )  Patient Location: PACU  Anesthesia Type:General  Level of Consciousness: awake, alert  and oriented  Airway & Oxygen Therapy: Patient Spontanous Breathing and Patient connected to face mask oxygen  Post-op Assessment: Report given to RN and Post -op Vital signs reviewed and stable  Post vital signs: Reviewed and stable  Last Vitals:  Vitals Value Taken Time  BP 125/89 12/07/19 0915  Temp    Pulse 80 12/07/19 0915  Resp 14 12/07/19 0915  SpO2 95 % 12/07/19 0915  Vitals shown include unvalidated device data.  Last Pain:  Vitals:   12/07/19 0723  TempSrc: Oral  PainSc: 4       Patients Stated Pain Goal: 5 (97/47/18 5501)  Complications: No complications documented.

## 2019-12-08 ENCOUNTER — Encounter (HOSPITAL_BASED_OUTPATIENT_CLINIC_OR_DEPARTMENT_OTHER): Payer: Self-pay | Admitting: General Surgery

## 2019-12-09 LAB — SURGICAL PATHOLOGY

## 2019-12-12 ENCOUNTER — Encounter: Payer: Self-pay | Admitting: Infectious Diseases

## 2019-12-19 ENCOUNTER — Other Ambulatory Visit: Payer: Self-pay

## 2019-12-19 ENCOUNTER — Ambulatory Visit
Admission: RE | Admit: 2019-12-19 | Discharge: 2019-12-19 | Disposition: A | Discharge status: Home / Self Care | Payer: Medicare HMO | Source: Ambulatory Visit | Attending: Internal Medicine | Admitting: Internal Medicine

## 2019-12-19 DIAGNOSIS — N1831 Chronic kidney disease, stage 3a: Secondary | ICD-10-CM

## 2019-12-20 ENCOUNTER — Ambulatory Visit: Payer: Self-pay | Admitting: General Surgery

## 2019-12-26 ENCOUNTER — Other Ambulatory Visit: Payer: Self-pay | Admitting: Cardiology

## 2019-12-26 DIAGNOSIS — I10 Essential (primary) hypertension: Secondary | ICD-10-CM

## 2020-01-12 ENCOUNTER — Other Ambulatory Visit: Payer: Self-pay

## 2020-01-12 ENCOUNTER — Encounter (HOSPITAL_BASED_OUTPATIENT_CLINIC_OR_DEPARTMENT_OTHER): Payer: Self-pay | Admitting: General Surgery

## 2020-01-21 ENCOUNTER — Other Ambulatory Visit (HOSPITAL_COMMUNITY)
Admission: RE | Admit: 2020-01-21 | Discharge: 2020-01-21 | Disposition: A | Discharge status: Home / Self Care | Payer: Medicare HMO | Source: Ambulatory Visit | Attending: General Surgery | Admitting: General Surgery

## 2020-01-21 DIAGNOSIS — Z20822 Contact with and (suspected) exposure to covid-19: Secondary | ICD-10-CM | POA: Insufficient documentation

## 2020-01-21 DIAGNOSIS — Z01812 Encounter for preprocedural laboratory examination: Secondary | ICD-10-CM | POA: Diagnosis present

## 2020-01-22 LAB — SARS CORONAVIRUS 2 (TAT 6-24 HRS): SARS Coronavirus 2: NEGATIVE

## 2020-01-23 ENCOUNTER — Encounter (HOSPITAL_BASED_OUTPATIENT_CLINIC_OR_DEPARTMENT_OTHER)
Admission: RE | Admit: 2020-01-23 | Discharge: 2020-01-23 | Disposition: A | Discharge status: Home / Self Care | Payer: Medicare HMO | Source: Ambulatory Visit | Attending: General Surgery | Admitting: General Surgery

## 2020-01-23 DIAGNOSIS — C44529 Squamous cell carcinoma of skin of other part of trunk: Secondary | ICD-10-CM | POA: Diagnosis present

## 2020-01-23 DIAGNOSIS — Z79899 Other long term (current) drug therapy: Secondary | ICD-10-CM | POA: Diagnosis not present

## 2020-01-23 DIAGNOSIS — Z9889 Other specified postprocedural states: Secondary | ICD-10-CM | POA: Diagnosis not present

## 2020-01-23 DIAGNOSIS — Z888 Allergy status to other drugs, medicaments and biological substances status: Secondary | ICD-10-CM | POA: Diagnosis not present

## 2020-01-23 DIAGNOSIS — Z7982 Long term (current) use of aspirin: Secondary | ICD-10-CM | POA: Diagnosis not present

## 2020-01-23 DIAGNOSIS — Z882 Allergy status to sulfonamides status: Secondary | ICD-10-CM | POA: Diagnosis not present

## 2020-01-23 LAB — BASIC METABOLIC PANEL
Anion gap: 9 (ref 5–15)
BUN: 9 mg/dL (ref 8–23)
CO2: 22 mmol/L (ref 22–32)
Calcium: 8.9 mg/dL (ref 8.9–10.3)
Chloride: 105 mmol/L (ref 98–111)
Creatinine, Ser: 1.32 mg/dL — ABNORMAL HIGH (ref 0.61–1.24)
GFR, Estimated: 58 mL/min — ABNORMAL LOW (ref >60)
Glucose, Bld: 92 mg/dL (ref 70–99)
Potassium: 4.1 mmol/L (ref 3.5–5.1)
Sodium: 136 mmol/L (ref 135–145)

## 2020-01-23 NOTE — Progress Notes (Signed)

## 2020-01-25 ENCOUNTER — Ambulatory Visit (HOSPITAL_BASED_OUTPATIENT_CLINIC_OR_DEPARTMENT_OTHER): Payer: Medicare HMO | Admitting: Certified Registered"

## 2020-01-25 ENCOUNTER — Other Ambulatory Visit: Payer: Self-pay

## 2020-01-25 ENCOUNTER — Ambulatory Visit (HOSPITAL_BASED_OUTPATIENT_CLINIC_OR_DEPARTMENT_OTHER)
Admission: RE | Admit: 2020-01-25 | Discharge: 2020-01-25 | Disposition: A | Discharge status: Home / Self Care | Payer: Medicare HMO | Attending: General Surgery | Admitting: General Surgery

## 2020-01-25 ENCOUNTER — Encounter (HOSPITAL_BASED_OUTPATIENT_CLINIC_OR_DEPARTMENT_OTHER)
Admission: RE | Disposition: A | Discharge status: Home / Self Care | Payer: Self-pay | Source: Home / Self Care | Attending: General Surgery

## 2020-01-25 DIAGNOSIS — Z888 Allergy status to other drugs, medicaments and biological substances status: Secondary | ICD-10-CM | POA: Insufficient documentation

## 2020-01-25 DIAGNOSIS — Z7982 Long term (current) use of aspirin: Secondary | ICD-10-CM | POA: Insufficient documentation

## 2020-01-25 DIAGNOSIS — C44529 Squamous cell carcinoma of skin of other part of trunk: Secondary | ICD-10-CM | POA: Diagnosis not present

## 2020-01-25 DIAGNOSIS — Z9889 Other specified postprocedural states: Secondary | ICD-10-CM | POA: Insufficient documentation

## 2020-01-25 DIAGNOSIS — Z882 Allergy status to sulfonamides status: Secondary | ICD-10-CM | POA: Insufficient documentation

## 2020-01-25 DIAGNOSIS — Z79899 Other long term (current) drug therapy: Secondary | ICD-10-CM | POA: Insufficient documentation

## 2020-01-25 HISTORY — PX: MASS EXCISION: SHX2000

## 2020-01-25 SURGERY — EXCISION MASS
Anesthesia: General | Site: Buttocks | Laterality: Left

## 2020-01-25 MED ORDER — EPHEDRINE SULFATE 50 MG/ML IJ SOLN
INTRAMUSCULAR | Status: DC | PRN
  Administered 2020-01-25 (×2): 10 mg via INTRAVENOUS

## 2020-01-25 MED ORDER — CEFAZOLIN SODIUM-DEXTROSE 2-4 GM/100ML-% IV SOLN
INTRAVENOUS | Status: AC
  Filled 2020-01-25: qty 100

## 2020-01-25 MED ORDER — MIDAZOLAM HCL 2 MG/2ML IJ SOLN
INTRAMUSCULAR | Status: AC
  Filled 2020-01-25: qty 2

## 2020-01-25 MED ORDER — PROPOFOL 10 MG/ML IV BOLUS
INTRAVENOUS | Status: AC
  Filled 2020-01-25: qty 20

## 2020-01-25 MED ORDER — OXYCODONE HCL 5 MG PO TABS
ORAL_TABLET | ORAL | Status: AC
  Filled 2020-01-25: qty 1

## 2020-01-25 MED ORDER — ROCURONIUM BROMIDE 10 MG/ML (PF) SYRINGE
PREFILLED_SYRINGE | INTRAVENOUS | Status: AC
  Filled 2020-01-25: qty 10

## 2020-01-25 MED ORDER — GABAPENTIN 300 MG PO CAPS
300.0000 mg | ORAL_CAPSULE | ORAL | Status: AC
  Administered 2020-01-25: 300 mg via ORAL

## 2020-01-25 MED ORDER — FENTANYL CITRATE (PF) 100 MCG/2ML IJ SOLN: 25.0000 ug | INTRAMUSCULAR | Status: DC | PRN

## 2020-01-25 MED ORDER — FENTANYL CITRATE (PF) 100 MCG/2ML IJ SOLN
INTRAMUSCULAR | Status: DC | PRN
  Administered 2020-01-25 (×2): 25 ug via INTRAVENOUS

## 2020-01-25 MED ORDER — CHLORHEXIDINE GLUCONATE CLOTH 2 % EX PADS: 6.0000 | MEDICATED_PAD | Freq: Once | CUTANEOUS | Status: DC

## 2020-01-25 MED ORDER — GABAPENTIN 300 MG PO CAPS
ORAL_CAPSULE | ORAL | Status: AC
  Filled 2020-01-25: qty 1

## 2020-01-25 MED ORDER — ONDANSETRON HCL 4 MG/2ML IJ SOLN
INTRAMUSCULAR | Status: DC | PRN
  Administered 2020-01-25: 4 mg via INTRAVENOUS

## 2020-01-25 MED ORDER — ONDANSETRON HCL 4 MG/2ML IJ SOLN: 4.0000 mg | Freq: Four times a day (QID) | INTRAMUSCULAR | Status: DC | PRN

## 2020-01-25 MED ORDER — FENTANYL CITRATE (PF) 100 MCG/2ML IJ SOLN
INTRAMUSCULAR | Status: AC
  Filled 2020-01-25: qty 2

## 2020-01-25 MED ORDER — ACETAMINOPHEN 500 MG PO TABS
1000.0000 mg | ORAL_TABLET | ORAL | Status: AC
  Administered 2020-01-25: 1000 mg via ORAL

## 2020-01-25 MED ORDER — DEXAMETHASONE SODIUM PHOSPHATE 10 MG/ML IJ SOLN
INTRAMUSCULAR | Status: DC | PRN
  Administered 2020-01-25: 10 mg via INTRAVENOUS

## 2020-01-25 MED ORDER — SUCCINYLCHOLINE CHLORIDE 200 MG/10ML IV SOSY
PREFILLED_SYRINGE | INTRAVENOUS | Status: AC
  Filled 2020-01-25: qty 10

## 2020-01-25 MED ORDER — EPHEDRINE 5 MG/ML INJ
INTRAVENOUS | Status: AC
  Filled 2020-01-25: qty 10

## 2020-01-25 MED ORDER — OXYCODONE HCL 5 MG/5ML PO SOLN: 5.0000 mg | Freq: Once | ORAL | Status: AC | PRN

## 2020-01-25 MED ORDER — LIDOCAINE 2% (20 MG/ML) 5 ML SYRINGE
INTRAMUSCULAR | Status: AC
  Filled 2020-01-25: qty 5

## 2020-01-25 MED ORDER — ACETAMINOPHEN 500 MG PO TABS
ORAL_TABLET | ORAL | Status: AC
  Filled 2020-01-25: qty 2

## 2020-01-25 MED ORDER — HYDROCODONE-ACETAMINOPHEN 5-325 MG PO TABS: 1.0000 | ORAL_TABLET | Freq: Four times a day (QID) | ORAL | 0 refills | Status: DC | PRN

## 2020-01-25 MED ORDER — MIDAZOLAM HCL 5 MG/5ML IJ SOLN
INTRAMUSCULAR | Status: DC | PRN
  Administered 2020-01-25: 2 mg via INTRAVENOUS

## 2020-01-25 MED ORDER — ONDANSETRON HCL 4 MG/2ML IJ SOLN
INTRAMUSCULAR | Status: AC
  Filled 2020-01-25: qty 2

## 2020-01-25 MED ORDER — OXYCODONE HCL 5 MG PO TABS
5.0000 mg | ORAL_TABLET | Freq: Once | ORAL | Status: AC | PRN
  Administered 2020-01-25: 5 mg via ORAL

## 2020-01-25 MED ORDER — PROPOFOL 10 MG/ML IV BOLUS
INTRAVENOUS | Status: DC | PRN
  Administered 2020-01-25: 200 mg via INTRAVENOUS

## 2020-01-25 MED ORDER — BUPIVACAINE HCL (PF) 0.25 % IJ SOLN
INTRAMUSCULAR | Status: DC | PRN
  Administered 2020-01-25: 10 mL

## 2020-01-25 MED ORDER — DEXAMETHASONE SODIUM PHOSPHATE 10 MG/ML IJ SOLN
INTRAMUSCULAR | Status: AC
  Filled 2020-01-25: qty 1

## 2020-01-25 MED ORDER — LIDOCAINE HCL (CARDIAC) PF 100 MG/5ML IV SOSY
PREFILLED_SYRINGE | INTRAVENOUS | Status: DC | PRN
  Administered 2020-01-25: 60 mg via INTRAVENOUS

## 2020-01-25 MED ORDER — CEFAZOLIN SODIUM-DEXTROSE 2-4 GM/100ML-% IV SOLN
2.0000 g | INTRAVENOUS | Status: AC
  Administered 2020-01-25: 2 g via INTRAVENOUS

## 2020-01-25 MED ORDER — PHENYLEPHRINE 40 MCG/ML (10ML) SYRINGE FOR IV PUSH (FOR BLOOD PRESSURE SUPPORT)
PREFILLED_SYRINGE | INTRAVENOUS | Status: AC
  Filled 2020-01-25: qty 10

## 2020-01-25 MED ORDER — LACTATED RINGERS IV SOLN: INTRAVENOUS | Status: DC

## 2020-01-25 SURGICAL SUPPLY — 49 items
ADH SKN CLS APL DERMABOND .7 (GAUZE/BANDAGES/DRESSINGS) ×1
APL PRP STRL LF DISP 70% ISPRP (MISCELLANEOUS)
BLADE SURG 10 STRL SS (BLADE) ×2 IMPLANT
BLADE SURG 15 STRL LF DISP TIS (BLADE) ×1 IMPLANT
BLADE SURG 15 STRL SS (BLADE) ×2
CANISTER SUCT 1200ML W/VALVE (MISCELLANEOUS) IMPLANT
CHLORAPREP W/TINT 26 (MISCELLANEOUS) ×1 IMPLANT
COVER BACK TABLE 60X90IN (DRAPES) ×2 IMPLANT
COVER MAYO STAND STRL (DRAPES) ×2 IMPLANT
COVER WAND RF STERILE (DRAPES) IMPLANT
DECANTER SPIKE VIAL GLASS SM (MISCELLANEOUS) ×1 IMPLANT
DERMABOND ADVANCED (GAUZE/BANDAGES/DRESSINGS) ×1
DERMABOND ADVANCED .7 DNX12 (GAUZE/BANDAGES/DRESSINGS) ×1 IMPLANT
DRAPE LAPAROTOMY 100X72 PEDS (DRAPES) ×2 IMPLANT
DRAPE UTILITY XL STRL (DRAPES) ×2 IMPLANT
ELECT COATED BLADE 2.86 ST (ELECTRODE) ×2 IMPLANT
ELECT REM PT RETURN 9FT ADLT (ELECTROSURGICAL) ×2
ELECTRODE REM PT RTRN 9FT ADLT (ELECTROSURGICAL) ×1 IMPLANT
GAUZE 4X4 16PLY RFD (DISPOSABLE) IMPLANT
GLOVE BIO SURGEON STRL SZ7.5 (GLOVE) ×3 IMPLANT
GOWN STRL REUS W/ TWL LRG LVL3 (GOWN DISPOSABLE) ×2 IMPLANT
GOWN STRL REUS W/TWL LRG LVL3 (GOWN DISPOSABLE) ×4
NDL HYPO 25X1 1.5 SAFETY (NEEDLE) IMPLANT
NEEDLE HYPO 25X1 1.5 SAFETY (NEEDLE) ×2 IMPLANT
NS IRRIG 1000ML POUR BTL (IV SOLUTION) ×2 IMPLANT
PACK BASIN DAY SURGERY FS (CUSTOM PROCEDURE TRAY) ×2 IMPLANT
PENCIL SMOKE EVACUATOR (MISCELLANEOUS) ×2 IMPLANT
SLEEVE SCD COMPRESS KNEE MED (MISCELLANEOUS) ×2 IMPLANT
SPONGE LAP 18X18 RF (DISPOSABLE) ×2 IMPLANT
SUT CHROMIC 3 0 SH 27 (SUTURE) IMPLANT
SUT ETHILON 3 0 PS 1 (SUTURE) IMPLANT
SUT MON AB 4-0 PC3 18 (SUTURE) IMPLANT
SUT PROLENE 3 0 PS 2 (SUTURE) IMPLANT
SUT SILK 2 0 PERMA HAND 18 BK (SUTURE) ×1 IMPLANT
SUT VIC AB 3-0 54X BRD REEL (SUTURE) IMPLANT
SUT VIC AB 3-0 BRD 54 (SUTURE)
SUT VIC AB 3-0 FS2 27 (SUTURE) IMPLANT
SUT VIC AB 3-0 SH 27 (SUTURE)
SUT VIC AB 3-0 SH 27X BRD (SUTURE) IMPLANT
SUT VIC AB 4-0 RB1 27 (SUTURE)
SUT VIC AB 4-0 RB1 27X BRD (SUTURE) IMPLANT
SUT VICRYL 3-0 CR8 SH (SUTURE) ×1 IMPLANT
SUT VICRYL 4-0 PS2 18IN ABS (SUTURE) ×1 IMPLANT
SUT VICRYL AB 3 0 TIES (SUTURE) IMPLANT
SYR CONTROL 10ML LL (SYRINGE) ×1 IMPLANT
TOWEL GREEN STERILE FF (TOWEL DISPOSABLE) ×4 IMPLANT
TRAY DSU PREP LF (CUSTOM PROCEDURE TRAY) ×1 IMPLANT
TUBE CONNECTING 20X1/4 (TUBING) ×1 IMPLANT
YANKAUER SUCT BULB TIP NO VENT (SUCTIONS) ×1 IMPLANT

## 2020-01-25 NOTE — Op Note (Addendum)
01/25/2020  3:42 PM  PATIENT:  Brandon Joyce  69 y.o. male  PRE-OPERATIVE DIAGNOSIS:  SQUAMOUS CELL CANCER OF BUTTOCK  POST-OPERATIVE DIAGNOSIS:  SQUAMOUS CELL CANCER OF BUTTOCK  PROCEDURE:  Procedure(s): 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 (Left)  SURGEON:  Surgeon(s) and Role:    * Jovita Kussmaul, MD - Primary  PHYSICIAN ASSISTANT:   ASSISTANTS: Dr. Lenore Cordia   ANESTHESIA:   local and general  EBL:  10 mL   BLOOD ADMINISTERED:none  DRAINS: none   LOCAL MEDICATIONS USED:  MARCAINE     SPECIMEN:  Source of Specimen:  squamous cell cancer from buttock  DISPOSITION OF SPECIMEN:  PATHOLOGY  COUNTS:  YES  TOURNIQUET:  * No tourniquets in log *  DICTATION: .Dragon Dictation   After informed consent was obtained the patient was brought to the operating room and placed in the supine position on the operating table.  After adequate induction of general anesthesia the patient was moved into a left side up lateral position on a beanbag and all pressure points were padded.  The left buttock area was then prepped with Betadine and draped in usual sterile manner.  An appropriate timeout was performed.  I then mapped out an elliptical incision around the open wound so that there was at least a centimeter from the edge of the wound on all sides.  An incision was then made along the mapped out lines with a 15 blade knife.  The incision was carried through the skin and subcutaneous tissue sharply with the electrocautery until the dissection reached the fascia of the muscle.  Once the specimen was removed it was marked with a short stitch on the superior surface and a long stitch on the lateral surface.  The specimen was then sent to pathology for further evaluation.  Hemostasis was achieved using the Bovie electrocautery.  The wound was infiltrated with quarter percent Marcaine.  The deep layer of the wound was then closed with interrupted 3-0 Vicryl stitches.  The skin was then closed  with a running 4-0 Monocryl subcuticular stitch.  Dermabond dressings were applied.  The patient tolerated the procedure well.  At the end of the case all needle sponge and instrument counts were correct.  The patient was then awakened and taken to recovery in stable condition. The final size of the specimen removed was approximately 7x4cm.  PLAN OF CARE: Discharge to home after PACU  PATIENT DISPOSITION:  PACU - hemodynamically stable.   Delay start of Pharmacological VTE agent (>24hrs) due to surgical blood loss or risk of bleeding: not applicable

## 2020-01-25 NOTE — Interval H&P Note (Signed)
History and Physical Interval Note:  01/25/2020 2:33 PM  Brandon Joyce  has presented today for surgery, with the diagnosis of SQUAMOUS CELL CANCER OF BUTTOCK.  The various methods of treatment have been discussed with the patient and family. After consideration of risks, benefits and other options for treatment, the patient has consented to  Procedure(s): 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 (Left) as a surgical intervention.  The patient's history has been reviewed, patient examined, no change in status, stable for surgery.  I have reviewed the patient's chart and labs.  Questions were answered to the patient's satisfaction.     Autumn Messing III

## 2020-01-25 NOTE — Discharge Instructions (Signed)
No Tylenol until 7:30pm if needed.  Post Anesthesia Home Care Instructions  Activity: Get plenty of rest for the remainder of the day. A responsible individual must stay with you for 24 hours following the procedure.  For the next 24 hours, DO NOT: -Drive a car -Paediatric nurse -Drink alcoholic beverages -Take any medication unless instructed by your physician -Make any legal decisions or sign important papers.  Meals: Start with liquid foods such as gelatin or soup. Progress to regular foods as tolerated. Avoid greasy, spicy, heavy foods. If nausea and/or vomiting occur, drink only clear liquids until the nausea and/or vomiting subsides. Call your physician if vomiting continues.  Special Instructions/Symptoms: Your throat may feel dry or sore from the anesthesia or the breathing tube placed in your throat during surgery. If this causes discomfort, gargle with warm salt water. The discomfort should disappear within 24 hours.  If you had a scopolamine patch placed behind your ear for the management of post- operative nausea and/or vomiting:  1. The medication in the patch is effective for 72 hours, after which it should be removed.  Wrap patch in a tissue and discard in the trash. Wash hands thoroughly with soap and water. 2. You may remove the patch earlier than 72 hours if you experience unpleasant side effects which may include dry mouth, dizziness or visual disturbances. 3. Avoid touching the patch. Wash your hands with soap and water after contact with the patch.

## 2020-01-25 NOTE — H&P (Signed)
Brandon Joyce  Location: [facility] Patient #: [id number] DOB: [birth year] Single / Language: [name] / Race: White Male   History of Present Illness  The patient is a 69 year old male who presents for a follow-up for Subcutaneous abscess. The patient is a 69 year old white male who we saw recently with an abscess on his left buttock. This was incised and drained in the operating room. Part of the wall of the abscess was sent to pathology for further evaluation and this came back as a squamous cell cancer. He tolerated the surgery well. This area will need to be widely excised.   Allergies  Sulfacetamide *CHEMICALS*  Rash. Atenolol *BETA BLOCKERS*  Isosorbide *CHEMICALS*  NitraTest Paper *DIAGNOSTIC PRODUCTS*  Pentaerythritol Tetranitrate *CHEMICALS*  Sulfonated Castor Oil *DERMATOLOGICALS*  Allergies Reconciled   Medication History  amLODIPine Besylate (10MG  Tablet, Oral) Active. Atorvastatin Calcium (20MG  Tablet, Oral) Active. hydrALAZINE HCl (25MG  Tablet, Oral) Active. Biktarvy (50-200-25MG  Tablet, Oral) Active. Aspir-Low (81MG  Tablet DR, Oral) Active. Medications Reconciled    Review of Systems General Not Present- Appetite Loss, Chills, Fatigue, Fever, Night Sweats, Weight Gain and Weight Loss. Note: All other systems negative (unless as noted in HPI & included Review of Systems) Skin Not Present- Change in Wart/Mole, Dryness, Hives, Jaundice, New Lesions, Non-Healing Wounds, Rash and Ulcer. HEENT Not Present- Earache, Hearing Loss, Hoarseness, Nose Bleed, Oral Ulcers, Ringing in the Ears, Seasonal Allergies, Sinus Pain, Sore Throat, Visual Disturbances, Wears glasses/contact lenses and Yellow Eyes. Respiratory Not Present- Bloody sputum, Chronic Cough, Difficulty Breathing, Snoring and Wheezing. Breast Not Present- Breast Mass, Breast Pain, Nipple Discharge and Skin Changes. Cardiovascular Not Present- Chest Pain, Difficulty Breathing Lying  Down, Leg Cramps, Palpitations, Rapid Heart Rate, Shortness of Breath and Swelling of Extremities. Gastrointestinal Not Present- Abdominal Pain, Bloating, Bloody Stool, Change in Bowel Habits, Chronic diarrhea, Constipation, Difficulty Swallowing, Excessive gas, Gets full quickly at meals, Hemorrhoids, Indigestion, Nausea, Rectal Pain and Vomiting. Male Genitourinary Not Present- Blood in Urine, Change in Urinary Stream, Frequency, Impotence, Nocturia, Painful Urination, Urgency and Urine Leakage. Musculoskeletal Not Present- Back Pain, Joint Pain, Joint Stiffness, Muscle Pain, Muscle Weakness and Swelling of Extremities. Neurological Not Present- Decreased Memory, Fainting, Headaches, Numbness, Seizures, Tingling, Tremor, Trouble walking and Weakness. Psychiatric Not Present- Anxiety, Bipolar, Change in Sleep Pattern, Depression, Fearful and Frequent crying. Endocrine Not Present- Cold Intolerance, Excessive Hunger, Hair Changes, Heat Intolerance and New Diabetes. Hematology Not Present- Easy Bruising, Excessive bleeding, Gland problems, HIV and Persistent Infections.  Vitals  Weight: 216.13 lb Height: 68in Body Surface Area: 2.11 m Body Mass Index: 32.86 kg/m  Temp.: 97.68F  Pulse: 71 (Regular)  BP: 132/84(Sitting, Left Arm, Standard)       Physical Exam  General Mental Status-Alert. General Appearance-Consistent with stated age. Hydration-Well hydrated. Voice-Normal.  Integumentary Note: The open wound on the left buttock is clean.   Head and Neck Head-normocephalic, atraumatic with no lesions or palpable masses. Trachea-midline. Thyroid Gland Characteristics - normal size and consistency.  Eye Eyeball - Bilateral-Extraocular movements intact. Sclera/Conjunctiva - Bilateral-No scleral icterus.  Chest and Lung Exam Chest and lung exam reveals -quiet, even and easy respiratory effort with no use of accessory muscles and on auscultation,  normal breath sounds, no adventitious sounds and normal vocal resonance. Inspection Chest Wall - Normal. Back - normal.  Cardiovascular Cardiovascular examination reveals -normal heart sounds, regular rate and rhythm with no murmurs and normal pedal pulses bilaterally.  Abdomen Inspection Inspection of the abdomen reveals - No Hernias. Skin -  Scar - no surgical scars. Palpation/Percussion Palpation and Percussion of the abdomen reveal - Soft, Non Tender, No Rebound tenderness, No Rigidity (guarding) and No hepatosplenomegaly. Auscultation Auscultation of the abdomen reveals - Bowel sounds normal.  Neurologic Neurologic evaluation reveals -alert and oriented x 3 with no impairment of recent or remote memory. Mental Status-Normal.  Musculoskeletal Normal Exam - Left-Upper Extremity Strength Normal and Lower Extremity Strength Normal. Normal Exam - Right-Upper Extremity Strength Normal and Lower Extremity Strength Normal.  Lymphatic Head & Neck  General Head & Neck Lymphatics: Bilateral - Description - Normal. Axillary  General Axillary Region: Bilateral - Description - Normal. Tenderness - Non Tender. Femoral & Inguinal  Generalized Femoral & Inguinal Lymphatics: Bilateral - Description - Normal. Tenderness - Non Tender.    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 The pathology on the wall of the abscess came back as squamous cell cancer. He will need to have this area widely excised. I discussed with him in detail the risks and benefits of the operation as well as some of the technical aspects understands and wishes to proceed. He is scheduled for this in the near future

## 2020-01-25 NOTE — Anesthesia Preprocedure Evaluation (Signed)
Anesthesia Evaluation  Patient identified by MRN, date of birth, ID band Patient awake    Reviewed: Allergy & Precautions, H&P , NPO status , Patient's Chart, lab work & pertinent test results  Airway Mallampati: II   Neck ROM: full    Dental   Pulmonary neg pulmonary ROS,    breath sounds clear to auscultation       Cardiovascular hypertension,  Rhythm:regular Rate:Normal     Neuro/Psych PSYCHIATRIC DISORDERS Depression    GI/Hepatic (+)     substance abuse  alcohol use,   Endo/Other    Renal/GU Renal InsufficiencyRenal disease     Musculoskeletal   Abdominal   Peds  Hematology  (+) HIV,   Anesthesia Other Findings   Reproductive/Obstetrics                             Anesthesia Physical Anesthesia Plan  ASA: II  Anesthesia Plan: General   Post-op Pain Management:    Induction: Intravenous  PONV Risk Score and Plan: 2 and Ondansetron, Dexamethasone, Midazolam and Treatment may vary due to age or medical condition  Airway Management Planned: Oral ETT  Additional Equipment:   Intra-op Plan:   Post-operative Plan: Extubation in OR  Informed Consent: I have reviewed the patients History and Physical, chart, labs and discussed the procedure including the risks, benefits and alternatives for the proposed anesthesia with the patient or authorized representative who has indicated his/her understanding and acceptance.       Plan Discussed with: CRNA, Anesthesiologist and Surgeon  Anesthesia Plan Comments:         Anesthesia Quick Evaluation

## 2020-01-25 NOTE — Anesthesia Procedure Notes (Signed)
Procedure Name: LMA Insertion Date/Time: 01/25/2020 2:58 PM Performed by: Lavonia Dana, CRNA Pre-anesthesia Checklist: Patient identified, Emergency Drugs available, Suction available and Patient being monitored Patient Re-evaluated:Patient Re-evaluated prior to induction Oxygen Delivery Method: Circle system utilized Preoxygenation: Pre-oxygenation with 100% oxygen Induction Type: IV induction Ventilation: Mask ventilation without difficulty LMA: LMA inserted LMA Size: 5.0 Number of attempts: 1 Airway Equipment and Method: Bite block Placement Confirmation: positive ETCO2 Tube secured with: Tape Dental Injury: Teeth and Oropharynx as per pre-operative assessment

## 2020-01-25 NOTE — Anesthesia Postprocedure Evaluation (Signed)
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  Last Vitals:  Vitals:   01/25/20 1615 01/25/20 1653  BP: (!) 142/80 (!) 142/78  Pulse: (!) 58 64  Resp: 11 16  Temp:  36.7 C  SpO2: 94% 96%    Last Pain:  Vitals:   01/25/20 1653  TempSrc:   PainSc: 3                  Lynda Rainwater

## 2020-01-25 NOTE — Transfer of Care (Signed)
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  Last Pain:  Vitals:   01/25/20 1303  TempSrc: Oral  PainSc: 0-No pain      Patients Stated Pain Goal: 3 (56/97/94 8016)  Complications: No complications documented.

## 2020-01-26 ENCOUNTER — Encounter (HOSPITAL_BASED_OUTPATIENT_CLINIC_OR_DEPARTMENT_OTHER): Payer: Self-pay | Admitting: General Surgery

## 2020-01-27 LAB — SURGICAL PATHOLOGY

## 2020-02-28 DIAGNOSIS — N1831 Chronic kidney disease, stage 3a: Secondary | ICD-10-CM | POA: Diagnosis not present

## 2020-02-28 DIAGNOSIS — N400 Enlarged prostate without lower urinary tract symptoms: Secondary | ICD-10-CM | POA: Diagnosis not present

## 2020-02-28 DIAGNOSIS — N528 Other male erectile dysfunction: Secondary | ICD-10-CM | POA: Diagnosis not present

## 2020-03-22 ENCOUNTER — Other Ambulatory Visit: Payer: Self-pay | Admitting: *Deleted

## 2020-03-22 DIAGNOSIS — B20 Human immunodeficiency virus [HIV] disease: Secondary | ICD-10-CM

## 2020-03-22 MED ORDER — BIKTARVY 50-200-25 MG PO TABS: 1.0000 | ORAL_TABLET | Freq: Every day | ORAL | 3 refills | Status: DC

## 2020-05-21 ENCOUNTER — Other Ambulatory Visit (HOSPITAL_COMMUNITY)
Admission: RE | Admit: 2020-05-21 | Discharge: 2020-05-21 | Disposition: A | Discharge status: Home / Self Care | Payer: Medicare HMO | Source: Ambulatory Visit | Attending: Infectious Diseases | Admitting: Infectious Diseases

## 2020-05-21 ENCOUNTER — Other Ambulatory Visit: Payer: Self-pay

## 2020-05-21 ENCOUNTER — Other Ambulatory Visit: Payer: Medicare HMO

## 2020-05-21 DIAGNOSIS — Z79899 Other long term (current) drug therapy: Secondary | ICD-10-CM | POA: Diagnosis not present

## 2020-05-21 DIAGNOSIS — Z113 Encounter for screening for infections with a predominantly sexual mode of transmission: Secondary | ICD-10-CM | POA: Insufficient documentation

## 2020-05-21 DIAGNOSIS — B20 Human immunodeficiency virus [HIV] disease: Secondary | ICD-10-CM

## 2020-05-21 DIAGNOSIS — R69 Illness, unspecified: Secondary | ICD-10-CM | POA: Diagnosis not present

## 2020-05-22 LAB — URINE CYTOLOGY ANCILLARY ONLY
Chlamydia: NEGATIVE
Comment: NEGATIVE
Comment: NORMAL
Neisseria Gonorrhea: NEGATIVE

## 2020-05-22 LAB — T-HELPER CELL (CD4) - (RCID CLINIC ONLY)
CD4 % Helper T Cell: 38 % (ref 33–65)
CD4 T Cell Abs: 627 /uL (ref 400–1790)

## 2020-05-24 LAB — COMPREHENSIVE METABOLIC PANEL
AG Ratio: 1.9 (calc) (ref 1.0–2.5)
ALT: 23 U/L (ref 9–46)
AST: 17 U/L (ref 10–35)
Albumin: 4.3 g/dL (ref 3.6–5.1)
Alkaline phosphatase (APISO): 55 U/L (ref 35–144)
BUN/Creatinine Ratio: 9 (calc) (ref 6–22)
BUN: 14 mg/dL (ref 7–25)
CO2: 25 mmol/L (ref 20–32)
Calcium: 9.2 mg/dL (ref 8.6–10.3)
Chloride: 103 mmol/L (ref 98–110)
Creat: 1.55 mg/dL — ABNORMAL HIGH (ref 0.70–1.25)
Globulin: 2.3 g/dL (calc) (ref 1.9–3.7)
Glucose, Bld: 114 mg/dL — ABNORMAL HIGH (ref 65–99)
Potassium: 4 mmol/L (ref 3.5–5.3)
Sodium: 138 mmol/L (ref 135–146)
Total Bilirubin: 0.5 mg/dL (ref 0.2–1.2)
Total Protein: 6.6 g/dL (ref 6.1–8.1)

## 2020-05-24 LAB — CBC
HCT: 44.4 % (ref 38.5–50.0)
Hemoglobin: 15.5 g/dL (ref 13.2–17.1)
MCH: 30.6 pg (ref 27.0–33.0)
MCHC: 34.9 g/dL (ref 32.0–36.0)
MCV: 87.7 fL (ref 80.0–100.0)
MPV: 9.9 fL (ref 7.5–12.5)
Platelets: 219 10*3/uL (ref 140–400)
RBC: 5.06 10*6/uL (ref 4.20–5.80)
RDW: 14.6 % (ref 11.0–15.0)
WBC: 5 10*3/uL (ref 3.8–10.8)

## 2020-05-24 LAB — LIPID PANEL
Cholesterol: 227 mg/dL — ABNORMAL HIGH (ref <200)
HDL: 33 mg/dL — ABNORMAL LOW (ref >=40)
Non-HDL Cholesterol (Calc): 194 mg/dL (calc) — ABNORMAL HIGH (ref <130)
Total CHOL/HDL Ratio: 6.9 (calc) — ABNORMAL HIGH (ref <5.0)
Triglycerides: 828 mg/dL — ABNORMAL HIGH (ref <150)

## 2020-05-24 LAB — HIV-1 RNA QUANT-NO REFLEX-BLD
HIV 1 RNA Quant: NOT DETECTED Copies/mL
HIV-1 RNA Quant, Log: NOT DETECTED Log cps/mL

## 2020-05-24 LAB — RPR: RPR Ser Ql: NONREACTIVE

## 2020-06-05 ENCOUNTER — Other Ambulatory Visit: Payer: Self-pay

## 2020-06-05 ENCOUNTER — Encounter: Payer: Self-pay | Admitting: Infectious Diseases

## 2020-06-05 ENCOUNTER — Ambulatory Visit (INDEPENDENT_AMBULATORY_CARE_PROVIDER_SITE_OTHER): Payer: Medicare HMO | Admitting: Infectious Diseases

## 2020-06-05 VITALS — BP 146/84 | HR 69 | Resp 16 | Ht 68.0 in | Wt 216.0 lb

## 2020-06-05 DIAGNOSIS — Z113 Encounter for screening for infections with a predominantly sexual mode of transmission: Secondary | ICD-10-CM | POA: Diagnosis not present

## 2020-06-05 DIAGNOSIS — N1831 Chronic kidney disease, stage 3a: Secondary | ICD-10-CM

## 2020-06-05 DIAGNOSIS — R079 Chest pain, unspecified: Secondary | ICD-10-CM

## 2020-06-05 DIAGNOSIS — B20 Human immunodeficiency virus [HIV] disease: Secondary | ICD-10-CM

## 2020-06-05 DIAGNOSIS — I10 Essential (primary) hypertension: Secondary | ICD-10-CM

## 2020-06-05 DIAGNOSIS — R69 Illness, unspecified: Secondary | ICD-10-CM | POA: Diagnosis not present

## 2020-06-05 DIAGNOSIS — Z79899 Other long term (current) drug therapy: Secondary | ICD-10-CM | POA: Diagnosis not present

## 2020-06-05 NOTE — Assessment & Plan Note (Signed)
He has f/u with CV

## 2020-06-05 NOTE — Assessment & Plan Note (Signed)
He is doing well He consents to [place] ( has not gotten vax) Needs flu vax but vax have ended for this year.  Offered/refused condoms.  Continue biktarvy rtc in 9 months.

## 2020-06-05 NOTE — Assessment & Plan Note (Signed)
He has seen nephrology (+/- urology?). States he had CT scan which did not show blockage.  His Cr is up from prev.  Will send back to renal.

## 2020-06-05 NOTE — Assessment & Plan Note (Signed)
Asx, on treatment. Mildly elevated today.

## 2020-06-05 NOTE — Progress Notes (Signed)
Subjective:    Patient ID: Brandon Joyce, male  DOB: [birth year], 70 y.o.        MRN: [id number]   HPI 70yo M with HIV+, statin related myalgias, hyperlipidemia, erectile dysfunction, hypertension, ETOH use.  He had cardiac cath 07-2013 that showed non-obstr disease. Had surgery on 04-27-15 shoulder due to reported work related injury.This has resolved.  Had another work related injury fall 2018, C6 pinched nerve. He had a nerve block which helped for ~ 48h.He had c-spine surgery 10-23-17.  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 Had hearing test done. Was told there was no tx for it.   Got penoplasty 07-2017 for ED. 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  Wife is WC bound. Now has small bed-sore. Has home health nurse helping. Has concerns about his parents- father has been falling (on blood thinners). Mother with continence issues.  Has been staying with his parents (alternating with his sister).  His Cr has been increasing slowly over the last 4 years. 1.55 (04-2020).    HIV 1 RNA Quant  Date Value  05/21/2020 Not Detected Copies/mL  08/25/2019 <20 NOT DETECTED copies/mL  11/11/2018 <20 NOT DETECTED copies/mL   CD4 T Cell Abs (/uL)  Date Value  05/21/2020 627  08/25/2019 530  01/26/2018 510     Health Maintenance  Topic Date Due  . COVID-19 Vaccine (1) Never done  . COLONOSCOPY (Pts 45-61yrs Insurance coverage will need to be confirmed)  02/27/2019  . INFLUENZA VACCINE  09/24/2020  . TETANUS/TDAP  12/30/2026  . Hepatitis C Screening  Completed  . PNA vac Low Risk Adult  Completed  . HPV VACCINES  Aged Out    Has not gotten COVID vax, consents to survey.   Review of Systems  Constitutional: Negative for chills and fever.  Respiratory: Negative for cough and shortness of breath.   Cardiovascular: Negative for chest pain.  Gastrointestinal: Negative for blood in stool and constipation.  Genitourinary: Negative for dysuria.  Psychiatric/Behavioral: The patient has  insomnia (related to caring for his parents).     Please see HPI. All other systems reviewed and negative.     Objective:  Physical Exam Vitals reviewed.  Constitutional:      Appearance: Normal appearance.  HENT:     Mouth/Throat:     Mouth: Mucous membranes are moist.     Pharynx: No oropharyngeal exudate.     Comments: Poor dentition.  Eyes:     Extraocular Movements: Extraocular movements intact.     Pupils: Pupils are equal, round, and reactive to light.  Cardiovascular:     Rate and Rhythm: Normal rate and regular rhythm.  Pulmonary:     Effort: Pulmonary effort is normal.     Breath sounds: Normal breath sounds.  Abdominal:     General: Bowel sounds are normal. There is no distension.     Palpations: Abdomen is soft.     Tenderness: There is no abdominal tenderness.  Musculoskeletal:        General: Normal range of motion.     Cervical back: Normal range of motion and neck supple.     Right lower leg: No edema.     Left lower leg: No edema.  Neurological:     General: No focal deficit present.     Mental Status: He is alert.  Psychiatric:        Mood and Affect: Mood normal.            Assessment & Plan:

## 2020-07-09 ENCOUNTER — Other Ambulatory Visit: Payer: Self-pay | Admitting: General Surgery

## 2020-07-09 DIAGNOSIS — D485 Neoplasm of uncertain behavior of skin: Secondary | ICD-10-CM | POA: Diagnosis not present

## 2020-07-09 DIAGNOSIS — L308 Other specified dermatitis: Secondary | ICD-10-CM | POA: Diagnosis not present

## 2020-07-09 DIAGNOSIS — D2371 Other benign neoplasm of skin of right lower limb, including hip: Secondary | ICD-10-CM | POA: Diagnosis not present

## 2020-07-09 DIAGNOSIS — L408 Other psoriasis: Secondary | ICD-10-CM | POA: Diagnosis not present

## 2020-07-09 DIAGNOSIS — L988 Other specified disorders of the skin and subcutaneous tissue: Secondary | ICD-10-CM | POA: Diagnosis not present

## 2020-07-09 DIAGNOSIS — L98499 Non-pressure chronic ulcer of skin of other sites with unspecified severity: Secondary | ICD-10-CM | POA: Diagnosis not present

## 2020-08-06 DIAGNOSIS — J069 Acute upper respiratory infection, unspecified: Secondary | ICD-10-CM | POA: Diagnosis not present

## 2020-09-06 ENCOUNTER — Other Ambulatory Visit: Payer: Self-pay | Admitting: Infectious Diseases

## 2020-09-06 ENCOUNTER — Ambulatory Visit: Payer: Medicare HMO

## 2020-09-06 ENCOUNTER — Other Ambulatory Visit: Payer: Self-pay

## 2020-09-06 DIAGNOSIS — B20 Human immunodeficiency virus [HIV] disease: Secondary | ICD-10-CM

## 2020-09-13 ENCOUNTER — Other Ambulatory Visit (HOSPITAL_COMMUNITY): Payer: Self-pay

## 2020-09-18 ENCOUNTER — Other Ambulatory Visit: Payer: Self-pay | Admitting: Cardiology

## 2020-09-18 DIAGNOSIS — I10 Essential (primary) hypertension: Secondary | ICD-10-CM

## 2020-10-15 ENCOUNTER — Encounter: Payer: Self-pay | Admitting: Infectious Diseases

## 2020-12-13 ENCOUNTER — Other Ambulatory Visit: Payer: Self-pay | Admitting: Cardiology

## 2020-12-13 DIAGNOSIS — I10 Essential (primary) hypertension: Secondary | ICD-10-CM

## 2020-12-14 ENCOUNTER — Other Ambulatory Visit: Payer: Self-pay | Admitting: Infectious Diseases

## 2020-12-14 DIAGNOSIS — B20 Human immunodeficiency virus [HIV] disease: Secondary | ICD-10-CM

## 2021-02-05 DIAGNOSIS — R051 Acute cough: Secondary | ICD-10-CM | POA: Diagnosis not present

## 2021-02-05 DIAGNOSIS — R059 Cough, unspecified: Secondary | ICD-10-CM | POA: Diagnosis not present

## 2021-02-05 DIAGNOSIS — Z20822 Contact with and (suspected) exposure to covid-19: Secondary | ICD-10-CM | POA: Diagnosis not present

## 2021-02-05 DIAGNOSIS — R0602 Shortness of breath: Secondary | ICD-10-CM | POA: Diagnosis not present

## 2021-02-05 DIAGNOSIS — R062 Wheezing: Secondary | ICD-10-CM | POA: Diagnosis not present

## 2021-02-05 DIAGNOSIS — R0981 Nasal congestion: Secondary | ICD-10-CM | POA: Diagnosis not present

## 2021-02-06 DIAGNOSIS — I7121 Aneurysm of the ascending aorta, without rupture: Secondary | ICD-10-CM | POA: Diagnosis not present

## 2021-02-06 DIAGNOSIS — E7800 Pure hypercholesterolemia, unspecified: Secondary | ICD-10-CM | POA: Diagnosis not present

## 2021-02-06 DIAGNOSIS — I517 Cardiomegaly: Secondary | ICD-10-CM | POA: Diagnosis not present

## 2021-02-06 DIAGNOSIS — R220 Localized swelling, mass and lump, head: Secondary | ICD-10-CM | POA: Diagnosis not present

## 2021-02-06 DIAGNOSIS — J101 Influenza due to other identified influenza virus with other respiratory manifestations: Secondary | ICD-10-CM | POA: Diagnosis not present

## 2021-02-06 DIAGNOSIS — J984 Other disorders of lung: Secondary | ICD-10-CM | POA: Diagnosis not present

## 2021-02-06 DIAGNOSIS — Z882 Allergy status to sulfonamides status: Secondary | ICD-10-CM | POA: Diagnosis not present

## 2021-02-06 DIAGNOSIS — Z79899 Other long term (current) drug therapy: Secondary | ICD-10-CM | POA: Diagnosis not present

## 2021-02-06 DIAGNOSIS — R051 Acute cough: Secondary | ICD-10-CM | POA: Diagnosis not present

## 2021-02-06 DIAGNOSIS — I2510 Atherosclerotic heart disease of native coronary artery without angina pectoris: Secondary | ICD-10-CM | POA: Diagnosis not present

## 2021-02-06 DIAGNOSIS — Z20822 Contact with and (suspected) exposure to covid-19: Secondary | ICD-10-CM | POA: Diagnosis not present

## 2021-02-06 DIAGNOSIS — I10 Essential (primary) hypertension: Secondary | ICD-10-CM | POA: Diagnosis not present

## 2021-02-06 DIAGNOSIS — R079 Chest pain, unspecified: Secondary | ICD-10-CM | POA: Diagnosis not present

## 2021-02-06 DIAGNOSIS — R509 Fever, unspecified: Secondary | ICD-10-CM | POA: Diagnosis not present

## 2021-02-06 DIAGNOSIS — R0789 Other chest pain: Secondary | ICD-10-CM | POA: Diagnosis not present

## 2021-02-06 DIAGNOSIS — Z7982 Long term (current) use of aspirin: Secondary | ICD-10-CM | POA: Diagnosis not present

## 2021-02-06 DIAGNOSIS — R55 Syncope and collapse: Secondary | ICD-10-CM | POA: Diagnosis not present

## 2021-02-07 DIAGNOSIS — R079 Chest pain, unspecified: Secondary | ICD-10-CM | POA: Diagnosis not present

## 2021-02-07 DIAGNOSIS — I7121 Aneurysm of the ascending aorta, without rupture: Secondary | ICD-10-CM | POA: Diagnosis not present

## 2021-02-07 DIAGNOSIS — R220 Localized swelling, mass and lump, head: Secondary | ICD-10-CM | POA: Diagnosis not present

## 2021-02-07 DIAGNOSIS — I2510 Atherosclerotic heart disease of native coronary artery without angina pectoris: Secondary | ICD-10-CM | POA: Diagnosis not present

## 2021-02-15 DIAGNOSIS — R0989 Other specified symptoms and signs involving the circulatory and respiratory systems: Secondary | ICD-10-CM | POA: Diagnosis not present

## 2021-02-20 NOTE — Progress Notes (Signed)
HPI:FU hypertension. Echocardiogram in May 2015 showed normal LV function. Nuclear study May of 2015 was normal with ejection fraction 59%. Cardiac catheterization June 2015 showed minimal nonobstructive coronary disease and normal LV function. Had atenolol DCed previously due to bradycardia.  CTA December 2022 showed 4 cm ascending aortic aneurysm.  Since last seen, patient denies dyspnea, chest pain, palpitations or syncope.  Current Outpatient Medications  Medication Sig Dispense Refill   amitriptyline (ELAVIL) 25 MG tablet amitriptyline 25 mg tablet  TAKE 1 TABLET BY MOUTH ONCE DAILY AT BEDTIME     amLODipine (NORVASC) 10 MG tablet Take 0.5 tablets (5 mg total) by mouth daily. 90 tablet 3   atorvastatin (LIPITOR) 20 MG tablet Take 1 tablet by mouth once daily 30 tablet 0   BIKTARVY 50-200-25 MG TABS tablet TAKE 1 TABLET DAILY 30 tablet 5   hydrALAZINE (APRESOLINE) 25 MG tablet Take 1 tablet (25 mg total) by mouth 3 (three) times daily. 270 tablet 3   Lisinopril-hydroCHLOROthiazide (ZESTORETIC PO) Take by mouth.     No current facility-administered medications for this visit.     Past Medical History:  Diagnosis Date   Alcoholism ([place])    Chronic kidney disease    followed by nephro   ED (erectile dysfunction)    HIV disease ([place])    HLD (hyperlipidemia)    HTN (hypertension)    Pneumonia     Past Surgical History:  Procedure Laterality Date   BACK SURGERY     INCISION AND DRAINAGE ABSCESS N/A 12/07/2019   Procedure: INCISION AND DRAINAGE BUTTOCK ABSCESS;  Surgeon: Jovita Kussmaul, MD;  Location: [facility];  Service: General;  Laterality: N/A;   LEFT HEART CATHETERIZATION WITH CORONARY ANGIOGRAM N/A 07/28/2013   Procedure: LEFT HEART CATHETERIZATION WITH CORONARY ANGIOGRAM;  Surgeon: Jettie Booze, MD;  Location: [facility] CATH LAB;  Service: Cardiovascular;  Laterality: N/A;   MASS EXCISION Left 01/25/2020   Procedure: REEXCISION OF SQUAMOUS CELL CANCER  FROM BUTTOCK;  Surgeon: Jovita Kussmaul, MD;  Location: [facility];  Service: General;  Laterality: Left;   ROTATOR CUFF REPAIR Right 04-27-2015   TONSILLECTOMY      Social History   Socioeconomic History   Marital status: Married    Spouse name: Not on file   Number of children: 3   Years of education: Not on file   Highest education level: GED or equivalent  Occupational History    Employer: [employer]  Tobacco Use   Smoking status: Never   Smokeless tobacco: Never  Vaping Use   Vaping Use: Never used  Substance and Sexual Activity   Alcohol use: Yes    Alcohol/week: 21.0 standard drinks    Types: 21 Standard drinks or equivalent per week    Comment: beer and whiskey - has increased   Drug use: No   Sexual activity: Not Currently    Partners: Female    Birth control/protection: Condom    Comment: accepted condoms today  Other Topics Concern   Not on file  Social History Narrative   Not on file   Social Determinants of Health   Financial Resource Strain: Not on file  Food Insecurity: Not on file  Transportation Needs: Not on file  Physical Activity: Not on file  Stress: Not on file  Social Connections: Not on file  Intimate Partner Violence: Not on file    Family History  Problem Relation Age of Onset   Diverticulitis  Mother    Heart disease Mother    Atrial fibrillation Mother    Heart disease Father        CABG   Colon cancer Brother     ROS: no fevers or chills, productive cough, hemoptysis, dysphasia, odynophagia, melena, hematochezia, dysuria, hematuria, rash, seizure activity, orthopnea, PND, pedal edema, claudication. Remaining systems are negative.  Physical Exam: Well-developed well-nourished in no acute distress.  Skin is warm and dry.  HEENT is normal.  Neck is supple.  Chest is clear to auscultation with normal expansion.  Cardiovascular exam is regular rate and rhythm.  Abdominal exam nontender or distended. No masses  palpated. Extremities show no edema. neuro grossly intact  ECG-normal sinus rhythm at a rate of 65, lateral T wave inversion; Q wave inversions slightly more prominent compared to August 31, 2019.  Personally reviewed  A/P  1 hypertension-patient's blood pressure is elevated; increase amlodipine to 10 mg daily.  We will follow and adjust medications as needed.  2 hyperlipidemia-continue statin.  Lipids and liver monitored by primary care.  3 HIV-managed by primary care.  4 thoracic aortic aneurysm-patient will need follow-up CTA December 2023.  Kirk Ruths, MD

## 2021-03-04 ENCOUNTER — Other Ambulatory Visit: Payer: Self-pay

## 2021-03-04 ENCOUNTER — Encounter: Payer: Self-pay | Admitting: Cardiology

## 2021-03-04 ENCOUNTER — Ambulatory Visit: Payer: Medicare HMO | Admitting: Cardiology

## 2021-03-04 VITALS — BP 162/114 | HR 65 | Ht 68.0 in | Wt 198.1 lb

## 2021-03-04 DIAGNOSIS — I712 Thoracic aortic aneurysm, without rupture, unspecified: Secondary | ICD-10-CM

## 2021-03-04 DIAGNOSIS — E7800 Pure hypercholesterolemia, unspecified: Secondary | ICD-10-CM

## 2021-03-04 DIAGNOSIS — I10 Essential (primary) hypertension: Secondary | ICD-10-CM

## 2021-03-04 MED ORDER — AMLODIPINE BESYLATE 10 MG PO TABS: 10.0000 mg | ORAL_TABLET | Freq: Every day | ORAL | 3 refills | Status: DC

## 2021-03-04 NOTE — Patient Instructions (Signed)
Medication Instructions:   INCREASE AMLODIPINE TO 10 MG ONCE DAILY  *If you need a refill on your cardiac medications before your next appointment, please call your pharmacy*   Follow-Up: At [facility], you and your health needs are our priority.  As part of our continuing mission to provide you with exceptional heart care, we have created designated Provider Care Teams.  These Care Teams include your primary Cardiologist (physician) and Advanced Practice Providers (APPs -  Physician Assistants and Nurse Practitioners) who all work together to provide you with the care you need, when you need it.  We recommend signing up for the patient portal called "MyChart".  Sign up information is provided on this After Visit Summary.  MyChart is used to connect with patients for Virtual Visits (Telemedicine).  Patients are able to view lab/test results, encounter notes, upcoming appointments, etc.  Non-urgent messages can be sent to your provider as well.   To learn more about what you can do with MyChart, go to [email or web address].    Your next appointment:   12 month(s)  The format for your next appointment:   In Person  Provider:   Kirk Ruths MD    Other Instructions  BLOOD PRESSURE GOAL IS LESS THAN 130/85

## 2021-03-08 ENCOUNTER — Other Ambulatory Visit: Payer: Self-pay

## 2021-03-08 ENCOUNTER — Other Ambulatory Visit: Payer: Self-pay | Admitting: Infectious Diseases

## 2021-03-08 ENCOUNTER — Other Ambulatory Visit: Payer: Medicare HMO

## 2021-03-08 DIAGNOSIS — Z113 Encounter for screening for infections with a predominantly sexual mode of transmission: Secondary | ICD-10-CM

## 2021-03-08 DIAGNOSIS — R69 Illness, unspecified: Secondary | ICD-10-CM | POA: Diagnosis not present

## 2021-03-08 DIAGNOSIS — B20 Human immunodeficiency virus [HIV] disease: Secondary | ICD-10-CM

## 2021-03-08 DIAGNOSIS — Z79899 Other long term (current) drug therapy: Secondary | ICD-10-CM | POA: Diagnosis not present

## 2021-03-11 LAB — T-HELPER CELLS (CD4) COUNT (NOT AT ARMC)
Absolute CD4: 753 cells/uL (ref 490–1740)
CD4 T Helper %: 36 % (ref 30–61)
Total lymphocyte count: 2115 cells/uL (ref 850–3900)

## 2021-03-11 LAB — RPR: RPR Ser Ql: NONREACTIVE

## 2021-03-11 LAB — COMPREHENSIVE METABOLIC PANEL
AG Ratio: 1.6 (calc) (ref 1.0–2.5)
ALT: 18 U/L (ref 9–46)
AST: 14 U/L (ref 10–35)
Albumin: 4.2 g/dL (ref 3.6–5.1)
Alkaline phosphatase (APISO): 48 U/L (ref 35–144)
BUN: 17 mg/dL (ref 7–25)
CO2: 24 mmol/L (ref 20–32)
Calcium: 9.4 mg/dL (ref 8.6–10.3)
Chloride: 105 mmol/L (ref 98–110)
Creat: 1.27 mg/dL (ref 0.70–1.28)
Globulin: 2.6 g/dL (calc) (ref 1.9–3.7)
Glucose, Bld: 104 mg/dL — ABNORMAL HIGH (ref 65–99)
Potassium: 4 mmol/L (ref 3.5–5.3)
Sodium: 138 mmol/L (ref 135–146)
Total Bilirubin: 0.6 mg/dL (ref 0.2–1.2)
Total Protein: 6.8 g/dL (ref 6.1–8.1)

## 2021-03-11 LAB — LIPID PANEL
Cholesterol: 167 mg/dL (ref <200)
HDL: 34 mg/dL — ABNORMAL LOW (ref >=40)
LDL Cholesterol (Calc): 84 mg/dL (calc)
Non-HDL Cholesterol (Calc): 133 mg/dL (calc) — ABNORMAL HIGH (ref <130)
Total CHOL/HDL Ratio: 4.9 (calc) (ref <5.0)
Triglycerides: 365 mg/dL — ABNORMAL HIGH (ref <150)

## 2021-03-11 LAB — CBC
HCT: 47.8 % (ref 38.5–50.0)
Hemoglobin: 16.1 g/dL (ref 13.2–17.1)
MCH: 29.8 pg (ref 27.0–33.0)
MCHC: 33.7 g/dL (ref 32.0–36.0)
MCV: 88.5 fL (ref 80.0–100.0)
MPV: 9.8 fL (ref 7.5–12.5)
Platelets: 246 10*3/uL (ref 140–400)
RBC: 5.4 10*6/uL (ref 4.20–5.80)
RDW: 13.9 % (ref 11.0–15.0)
WBC: 5.1 10*3/uL (ref 3.8–10.8)

## 2021-03-11 LAB — HIV-1 RNA QUANT-NO REFLEX-BLD
HIV 1 RNA Quant: NOT DETECTED Copies/mL
HIV-1 RNA Quant, Log: NOT DETECTED Log cps/mL

## 2021-03-22 ENCOUNTER — Other Ambulatory Visit: Payer: Self-pay

## 2021-03-22 ENCOUNTER — Encounter: Payer: Self-pay | Admitting: Infectious Diseases

## 2021-03-22 ENCOUNTER — Ambulatory Visit (INDEPENDENT_AMBULATORY_CARE_PROVIDER_SITE_OTHER): Payer: Medicare HMO | Admitting: Infectious Diseases

## 2021-03-22 VITALS — BP 159/90 | HR 66 | Temp 98.1°F | Wt 201.0 lb

## 2021-03-22 DIAGNOSIS — Z23 Encounter for immunization: Secondary | ICD-10-CM

## 2021-03-22 DIAGNOSIS — B20 Human immunodeficiency virus [HIV] disease: Secondary | ICD-10-CM | POA: Diagnosis not present

## 2021-03-22 DIAGNOSIS — F1020 Alcohol dependence, uncomplicated: Secondary | ICD-10-CM

## 2021-03-22 DIAGNOSIS — Z79899 Other long term (current) drug therapy: Secondary | ICD-10-CM

## 2021-03-22 DIAGNOSIS — I10 Essential (primary) hypertension: Secondary | ICD-10-CM | POA: Diagnosis not present

## 2021-03-22 DIAGNOSIS — Z113 Encounter for screening for infections with a predominantly sexual mode of transmission: Secondary | ICD-10-CM

## 2021-03-22 DIAGNOSIS — N1831 Chronic kidney disease, stage 3a: Secondary | ICD-10-CM | POA: Diagnosis not present

## 2021-03-22 DIAGNOSIS — R079 Chest pain, unspecified: Secondary | ICD-10-CM | POA: Diagnosis not present

## 2021-03-22 DIAGNOSIS — R69 Illness, unspecified: Secondary | ICD-10-CM | POA: Diagnosis not present

## 2021-03-22 MED ORDER — AMLODIPINE BESYLATE 10 MG PO TABS: 10.0000 mg | ORAL_TABLET | Freq: Every day | ORAL | 3 refills | Status: DC

## 2021-03-22 NOTE — Progress Notes (Signed)
Subjective:    Patient ID: Brandon Joyce, male  DOB: [birth year], 71 y.o.        MRN: [id number]   HPI 71 yo M with HIV+, statin related myalgias, hyperlipidemia, erectile dysfunction, hypertension, ETOH use.   He had cardiac cath 07-2013 that showed non-obstr disease. He has thoracic aortic aneurysm as well, repeat CTA 01-2022.  Had surgery on 04-27-15 shoulder due to reported work related injury. This has resolved.  Had another work related injury fall 2018, C6 pinched nerve.  He had a nerve block which helped for ~ 48h. He had c-spine surgery 10-23-17.  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 Had hearing test done. Was told there was no tx for it.    Got penoplasty 07-2017 for ED.  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 No problems with this.    Wife is WC bound, prev bed-sore resolved. Has home health nurse helping. Has concerns about his parents- father (77 yo) has been falling- hit his head with lat fall (had hypO-Na). Mother with continence issues.  Has help from his sister.  His Cr has been increasing slowly over the last 4 years, now improved last blood draw 1.27.   Today he is worried about getting his amlodipine refilled.  Has dieted prev and lost sig wt but since parents health declined has increased.  Drinking 2-3 beer/evening. If football on 5-6.    HIV 1 RNA Quant  Date Value  03/08/2021 Not Detected Copies/mL  05/21/2020 Not Detected Copies/mL  08/25/2019 <20 NOT DETECTED copies/mL   CD4 T Cell Abs (/uL)  Date Value  05/21/2020 627  08/25/2019 530  01/26/2018 510     Health Maintenance  Topic Date Due   COVID-19 Vaccine (1) Never done   Zoster Vaccines- Shingrix (1 of 2) Never done   COLONOSCOPY (Pts 45-24yrs Insurance coverage will need to be confirmed)  02/27/2019   INFLUENZA VACCINE  09/24/2020   TETANUS/TDAP  12/30/2026   Pneumonia Vaccine 94+ Years old  Completed   Hepatitis C Screening  Completed   HPV VACCINES  Aged Out      Review of Systems   Constitutional:  Negative for chills, fever and weight loss.  Respiratory:  Negative for cough and shortness of breath.   Cardiovascular:  Negative for chest pain.  Gastrointestinal:  Negative for constipation and diarrhea.  Genitourinary:  Negative for dysuria.  Neurological:  Negative for headaches.   Please see HPI. All other systems reviewed and negative.     Objective:  Physical Exam Vitals reviewed.  Constitutional:      Appearance: Normal appearance.  HENT:     Mouth/Throat:     Mouth: Mucous membranes are moist.     Pharynx: No oropharyngeal exudate.  Eyes:     Extraocular Movements: Extraocular movements intact.     Pupils: Pupils are equal, round, and reactive to light.  Cardiovascular:     Rate and Rhythm: Normal rate and regular rhythm.  Pulmonary:     Effort: Pulmonary effort is normal.     Breath sounds: Normal breath sounds.  Abdominal:     General: Bowel sounds are normal. There is no distension.     Palpations: Abdomen is soft.     Tenderness: There is no abdominal tenderness.  Musculoskeletal:        General: Normal range of motion.     Cervical back: Normal range of motion and neck supple.     Right lower leg: No edema.  Left lower leg: No edema.  Neurological:     General: No focal deficit present.     Mental Status: He is alert.           Assessment & Plan:

## 2021-03-22 NOTE — Assessment & Plan Note (Signed)
Forgot his rx this am.  Will take when he gets home. Also, his refill his amlopdipine will help.

## 2021-03-22 NOTE — Assessment & Plan Note (Signed)
I encouraged him to monitor, decrease his ETOH.  He does not drive intoxicated.

## 2021-03-22 NOTE — Assessment & Plan Note (Signed)
Currently asx.  

## 2021-03-22 NOTE — Assessment & Plan Note (Signed)
Improved this visit.

## 2021-03-22 NOTE — Assessment & Plan Note (Signed)
He is doing well He refuses vaccine (COVID) will take flu vax. pnvx is complete  Continue biktarvy Offered/refused condoms.  Will see him back in 3-4 months (BP check).

## 2021-03-25 ENCOUNTER — Other Ambulatory Visit (HOSPITAL_COMMUNITY): Payer: Self-pay

## 2021-04-11 DIAGNOSIS — S86111A Strain of other muscle(s) and tendon(s) of posterior muscle group at lower leg level, right leg, initial encounter: Secondary | ICD-10-CM | POA: Diagnosis not present

## 2021-05-22 ENCOUNTER — Other Ambulatory Visit: Payer: Self-pay | Admitting: Infectious Diseases

## 2021-05-22 DIAGNOSIS — B20 Human immunodeficiency virus [HIV] disease: Secondary | ICD-10-CM

## 2021-05-30 ENCOUNTER — Other Ambulatory Visit (HOSPITAL_COMMUNITY)
Admission: RE | Admit: 2021-05-30 | Discharge: 2021-05-30 | Disposition: A | Discharge status: Home / Self Care | Payer: Medicare HMO | Source: Ambulatory Visit | Attending: Infectious Diseases | Admitting: Infectious Diseases

## 2021-05-30 ENCOUNTER — Other Ambulatory Visit: Payer: Self-pay

## 2021-05-30 ENCOUNTER — Other Ambulatory Visit: Payer: Medicare HMO

## 2021-05-30 DIAGNOSIS — B20 Human immunodeficiency virus [HIV] disease: Secondary | ICD-10-CM

## 2021-05-30 DIAGNOSIS — Z113 Encounter for screening for infections with a predominantly sexual mode of transmission: Secondary | ICD-10-CM | POA: Diagnosis not present

## 2021-05-30 DIAGNOSIS — Z79899 Other long term (current) drug therapy: Secondary | ICD-10-CM

## 2021-05-30 DIAGNOSIS — R69 Illness, unspecified: Secondary | ICD-10-CM | POA: Diagnosis not present

## 2021-05-31 LAB — URINE CYTOLOGY ANCILLARY ONLY
Chlamydia: NEGATIVE
Comment: NEGATIVE
Comment: NORMAL
Neisseria Gonorrhea: NEGATIVE

## 2021-05-31 LAB — T-HELPER CELL (CD4) - (RCID CLINIC ONLY)
CD4 % Helper T Cell: 40 % (ref 33–65)
CD4 T Cell Abs: 612 /uL (ref 400–1790)

## 2021-06-01 LAB — COMPLETE METABOLIC PANEL WITH GFR
AG Ratio: 1.7 (calc) (ref 1.0–2.5)
ALT: 21 U/L (ref 9–46)
AST: 16 U/L (ref 10–35)
Albumin: 4.3 g/dL (ref 3.6–5.1)
Alkaline phosphatase (APISO): 46 U/L (ref 35–144)
BUN/Creatinine Ratio: 11 (calc) (ref 6–22)
BUN: 14 mg/dL (ref 7–25)
CO2: 23 mmol/L (ref 20–32)
Calcium: 9.4 mg/dL (ref 8.6–10.3)
Chloride: 105 mmol/L (ref 98–110)
Creat: 1.3 mg/dL — ABNORMAL HIGH (ref 0.70–1.28)
Globulin: 2.5 g/dL (calc) (ref 1.9–3.7)
Glucose, Bld: 123 mg/dL — ABNORMAL HIGH (ref 65–99)
Potassium: 3.9 mmol/L (ref 3.5–5.3)
Sodium: 137 mmol/L (ref 135–146)
Total Bilirubin: 0.5 mg/dL (ref 0.2–1.2)
Total Protein: 6.8 g/dL (ref 6.1–8.1)
eGFR: 59 mL/min/{1.73_m2} — ABNORMAL LOW (ref >=60)

## 2021-06-01 LAB — HIV-1 RNA QUANT-NO REFLEX-BLD
HIV 1 RNA Quant: NOT DETECTED copies/mL
HIV-1 RNA Quant, Log: NOT DETECTED Log copies/mL

## 2021-06-01 LAB — RPR: RPR Ser Ql: NONREACTIVE

## 2021-06-01 LAB — LIPID PANEL
Cholesterol: 155 mg/dL (ref <200)
HDL: 34 mg/dL — ABNORMAL LOW (ref >=40)
Non-HDL Cholesterol (Calc): 121 mg/dL (calc) (ref <130)
Total CHOL/HDL Ratio: 4.6 (calc) (ref <5.0)
Triglycerides: 455 mg/dL — ABNORMAL HIGH (ref <150)

## 2021-06-01 LAB — CBC
HCT: 46.2 % (ref 38.5–50.0)
Hemoglobin: 15.4 g/dL (ref 13.2–17.1)
MCH: 30.1 pg (ref 27.0–33.0)
MCHC: 33.3 g/dL (ref 32.0–36.0)
MCV: 90.4 fL (ref 80.0–100.0)
MPV: 10.1 fL (ref 7.5–12.5)
Platelets: 220 10*3/uL (ref 140–400)
RBC: 5.11 10*6/uL (ref 4.20–5.80)
RDW: 14.4 % (ref 11.0–15.0)
WBC: 4.9 10*3/uL (ref 3.8–10.8)

## 2021-06-14 ENCOUNTER — Other Ambulatory Visit: Payer: Self-pay

## 2021-06-14 ENCOUNTER — Ambulatory Visit (INDEPENDENT_AMBULATORY_CARE_PROVIDER_SITE_OTHER): Payer: Medicare HMO | Admitting: Infectious Diseases

## 2021-06-14 ENCOUNTER — Encounter: Payer: Self-pay | Admitting: Infectious Diseases

## 2021-06-14 VITALS — BP 171/97 | HR 61 | Temp 98.0°F | Wt 210.0 lb

## 2021-06-14 DIAGNOSIS — N1831 Chronic kidney disease, stage 3a: Secondary | ICD-10-CM | POA: Diagnosis not present

## 2021-06-14 DIAGNOSIS — Z113 Encounter for screening for infections with a predominantly sexual mode of transmission: Secondary | ICD-10-CM | POA: Diagnosis not present

## 2021-06-14 DIAGNOSIS — I10 Essential (primary) hypertension: Secondary | ICD-10-CM | POA: Diagnosis not present

## 2021-06-14 DIAGNOSIS — B20 Human immunodeficiency virus [HIV] disease: Secondary | ICD-10-CM

## 2021-06-14 DIAGNOSIS — Z79899 Other long term (current) drug therapy: Secondary | ICD-10-CM | POA: Diagnosis not present

## 2021-06-14 DIAGNOSIS — R69 Illness, unspecified: Secondary | ICD-10-CM | POA: Diagnosis not present

## 2021-06-14 NOTE — Assessment & Plan Note (Signed)
He agrees to nephro eval at next visit if his Cr remains elevated.  ?

## 2021-06-14 NOTE — Progress Notes (Signed)
? ?Subjective:  ? ? Patient ID: Brandon Joyce, male  DOB: March 15, 1950, 71 y.o.        MRN: [id number] ? ? ?HPI ?71 yo M with HIV+, statin related myalgias, hyperlipidemia, erectile dysfunction, hypertension, ETOH use.   ?He had cardiac cath 07-2013 that showed non-obstr disease. ?He has thoracic aortic aneurysm as well, repeat CTA 01-2022.  ?Had surgery on 04-27-15 shoulder due to reported work related injury. This has resolved.  ?Had another work related injury fall 2018, C6 pinched nerve.  He had a nerve block which helped for ~ 48h. He had c-spine surgery 10-23-17.  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  ?  ?Got penoplasty 07-2017 for ED.  ?Atripla ---> biktarvy (12-2016). No problems with this.  ?His Cr has increased some (1.3 now).  ?  ?Wife is WC bound, prev bed-sore resolved. Has home health nurse helping. ?Has concerns about his parents- father (27 yo) has been falling- hit his head with lat fall (had hypO-Na). Mother with continence issues.  Has help from his sister.  ?Dad had recent fall.  ?  ?Has dieted prev and lost sig wt but since parents health declined has increased. He has been unable to stick with his diet.  ?Wt currently back up slightly.  ?Drinking 2-3 beer/evening. If football on 5-6.  ?Never smoker, wife smokes. She is becoming less mobile (feels like she is a burden).  ? ?HIV 1 RNA Quant  ?Date Value  ?05/30/2021 NOT DETECTED copies/mL  ?03/08/2021 Not Detected Copies/mL  ?05/21/2020 Not Detected Copies/mL  ? ?CD4 T Cell Abs (/uL)  ?Date Value  ?05/30/2021 612  ?05/21/2020 627  ?08/25/2019 530  ? ? ? ?Health Maintenance  ?Topic Date Due  ? COVID-19 Vaccine (1) Never done  ? Zoster Vaccines- Shingrix (1 of 2) Never done  ? COLONOSCOPY (Pts 45-69yr Insurance coverage will need to be confirmed)  02/27/2019  ? INFLUENZA VACCINE  09/24/2021  ? TETANUS/TDAP  12/30/2026  ? Pneumonia Vaccine 71 Years old  Completed  ? Hepatitis C Screening  Completed  ? HPV VACCINES  Aged Out  ? ? ? ? ?Review  of Systems  ?Constitutional:  Negative for chills, fever and weight loss.  ?Respiratory:  Positive for shortness of breath (relieved with his mom's inhalers. DOE).   ?Cardiovascular:  Negative for chest pain.  ?Gastrointestinal:  Negative for constipation and diarrhea.  ?Genitourinary:  Negative for dysuria.  ?Neurological:  Negative for headaches.  ?All other systems reviewed and are negative. ? ?Please see HPI. All other systems reviewed and negative. ? ?   ?Objective:  ?Physical Exam ?Vitals reviewed.  ?Constitutional:   ?   General: He is not in acute distress. ?   Appearance: He is not toxic-appearing.  ?HENT:  ?   Mouth/Throat:  ?   Mouth: Mucous membranes are moist.  ?   Pharynx: No oropharyngeal exudate.  ?Eyes:  ?   Extraocular Movements: Extraocular movements intact.  ?   Pupils: Pupils are equal, round, and reactive to light.  ?Cardiovascular:  ?   Rate and Rhythm: Normal rate and regular rhythm.  ?Pulmonary:  ?   Effort: Pulmonary effort is normal.  ?   Breath sounds: Normal breath sounds.  ?Abdominal:  ?   General: Bowel sounds are normal. There is no distension.  ?   Palpations: Abdomen is soft.  ?   Tenderness: There is no abdominal tenderness.  ?Musculoskeletal:     ?   General:  Normal range of motion.  ?   Cervical back: Normal range of motion and neck supple.  ?   Right lower leg: No edema.  ?   Left lower leg: No edema.  ?Neurological:  ?   General: No focal deficit present.  ?   Mental Status: He is alert.  ? ? ? ? ? ? ?   ?Assessment & Plan:  ? ?

## 2021-06-14 NOTE — Assessment & Plan Note (Signed)
He is doing well on biktarvy.  ?Will continue his current art ?His major stressors are his family members illnesses.  ?Will see him in 9 months ?Labs prior.  ?Offered/refused condoms.  ? ?

## 2021-06-14 NOTE — Assessment & Plan Note (Addendum)
Quite elevated today. ?He has not taking his rx.   ?Will send him to nephro at next visit if remains elevated, per his request.  ?BP recheck-173/96.  ?Re-inforced need to take bp rx.  ? ?

## 2021-07-09 ENCOUNTER — Other Ambulatory Visit (HOSPITAL_COMMUNITY): Payer: Self-pay

## 2021-07-11 DIAGNOSIS — E785 Hyperlipidemia, unspecified: Secondary | ICD-10-CM | POA: Diagnosis not present

## 2021-07-11 DIAGNOSIS — Z6832 Body mass index (BMI) 32.0-32.9, adult: Secondary | ICD-10-CM | POA: Diagnosis not present

## 2021-07-11 DIAGNOSIS — Z833 Family history of diabetes mellitus: Secondary | ICD-10-CM | POA: Diagnosis not present

## 2021-07-11 DIAGNOSIS — Z8249 Family history of ischemic heart disease and other diseases of the circulatory system: Secondary | ICD-10-CM | POA: Diagnosis not present

## 2021-07-11 DIAGNOSIS — R0602 Shortness of breath: Secondary | ICD-10-CM | POA: Diagnosis not present

## 2021-07-11 DIAGNOSIS — Z008 Encounter for other general examination: Secondary | ICD-10-CM | POA: Diagnosis not present

## 2021-07-11 DIAGNOSIS — N529 Male erectile dysfunction, unspecified: Secondary | ICD-10-CM | POA: Diagnosis not present

## 2021-07-11 DIAGNOSIS — Z79899 Other long term (current) drug therapy: Secondary | ICD-10-CM | POA: Diagnosis not present

## 2021-07-11 DIAGNOSIS — I10 Essential (primary) hypertension: Secondary | ICD-10-CM | POA: Diagnosis not present

## 2021-07-11 DIAGNOSIS — Z809 Family history of malignant neoplasm, unspecified: Secondary | ICD-10-CM | POA: Diagnosis not present

## 2021-07-11 DIAGNOSIS — E669 Obesity, unspecified: Secondary | ICD-10-CM | POA: Diagnosis not present

## 2021-07-11 DIAGNOSIS — Z882 Allergy status to sulfonamides status: Secondary | ICD-10-CM | POA: Diagnosis not present

## 2021-07-11 DIAGNOSIS — R69 Illness, unspecified: Secondary | ICD-10-CM | POA: Diagnosis not present

## 2021-08-14 ENCOUNTER — Encounter: Payer: Self-pay | Admitting: Infectious Diseases

## 2021-09-16 ENCOUNTER — Telehealth: Payer: Self-pay | Admitting: Cardiology

## 2021-09-16 NOTE — Telephone Encounter (Signed)
*  STAT* If patient is at the pharmacy, call can be transferred to refill team.   1. Which medications need to be refilled? (please list name of each medication and dose if known) Lisinopril-hydroCHLOROthiazide (ZESTORETIC PO) 2. Which pharmacy/location (including street and city if local pharmacy) is medication to be sent to? [place] [address]  3. Do they need a 30 day or 90 day supply? [place]

## 2021-09-17 NOTE — Telephone Encounter (Signed)
Unable to reach pt or leave a message  

## 2021-09-20 NOTE — Telephone Encounter (Signed)
Left message for pt to call.

## 2021-09-25 DIAGNOSIS — J019 Acute sinusitis, unspecified: Secondary | ICD-10-CM | POA: Diagnosis not present

## 2021-09-25 DIAGNOSIS — H109 Unspecified conjunctivitis: Secondary | ICD-10-CM | POA: Diagnosis not present

## 2021-09-25 NOTE — Telephone Encounter (Signed)
Pt called back on [phone]. He states he will call back with the correct dosage, pt was not at home at the moment.

## 2021-09-25 NOTE — Telephone Encounter (Signed)
Number is out of service.

## 2021-10-10 DIAGNOSIS — M25561 Pain in right knee: Secondary | ICD-10-CM | POA: Diagnosis not present

## 2021-10-10 DIAGNOSIS — G8929 Other chronic pain: Secondary | ICD-10-CM | POA: Diagnosis not present

## 2021-12-16 ENCOUNTER — Other Ambulatory Visit: Payer: Self-pay | Admitting: Infectious Diseases

## 2021-12-16 DIAGNOSIS — B20 Human immunodeficiency virus [HIV] disease: Secondary | ICD-10-CM

## 2021-12-16 NOTE — Telephone Encounter (Signed)
Left vm needs appt with a provider (previous [place])

## 2021-12-20 ENCOUNTER — Telehealth: Payer: Self-pay | Admitting: *Deleted

## 2021-12-20 NOTE — Telephone Encounter (Signed)
Patient walked in this morning asking to speak with Dr. Johnnye Sima. States Dr. Johnnye Sima previously suggested he meet with therapist but he was not ready. States now he's ready. Patient became tearful stating, "I got myself into a mess." Briefly described marital difficulties. Denied SI/HI. This RN offered to listen but patient declined. He was given resources for walk in [facility] clinics: Family service of the [place], [place], [place], [place]. He was very [employer]. He has appt with PCP on 03/26/22.

## 2021-12-29 DIAGNOSIS — Z7982 Long term (current) use of aspirin: Secondary | ICD-10-CM | POA: Diagnosis not present

## 2021-12-29 DIAGNOSIS — R0602 Shortness of breath: Secondary | ICD-10-CM | POA: Diagnosis not present

## 2021-12-29 DIAGNOSIS — R61 Generalized hyperhidrosis: Secondary | ICD-10-CM | POA: Diagnosis not present

## 2021-12-29 DIAGNOSIS — R0609 Other forms of dyspnea: Secondary | ICD-10-CM | POA: Diagnosis not present

## 2021-12-29 DIAGNOSIS — E7800 Pure hypercholesterolemia, unspecified: Secondary | ICD-10-CM | POA: Diagnosis not present

## 2021-12-29 DIAGNOSIS — R079 Chest pain, unspecified: Secondary | ICD-10-CM | POA: Diagnosis not present

## 2021-12-29 DIAGNOSIS — R0989 Other specified symptoms and signs involving the circulatory and respiratory systems: Secondary | ICD-10-CM | POA: Diagnosis not present

## 2021-12-29 DIAGNOSIS — R0600 Dyspnea, unspecified: Secondary | ICD-10-CM | POA: Diagnosis not present

## 2021-12-29 DIAGNOSIS — I10 Essential (primary) hypertension: Secondary | ICD-10-CM | POA: Diagnosis not present

## 2021-12-29 DIAGNOSIS — R0789 Other chest pain: Secondary | ICD-10-CM | POA: Diagnosis not present

## 2021-12-29 DIAGNOSIS — Z1152 Encounter for screening for COVID-19: Secondary | ICD-10-CM | POA: Diagnosis not present

## 2021-12-29 DIAGNOSIS — J029 Acute pharyngitis, unspecified: Secondary | ICD-10-CM | POA: Diagnosis not present

## 2021-12-29 DIAGNOSIS — Z882 Allergy status to sulfonamides status: Secondary | ICD-10-CM | POA: Diagnosis not present

## 2021-12-29 DIAGNOSIS — Z79899 Other long term (current) drug therapy: Secondary | ICD-10-CM | POA: Diagnosis not present

## 2021-12-31 ENCOUNTER — Other Ambulatory Visit: Payer: Self-pay | Admitting: Infectious Diseases

## 2021-12-31 DIAGNOSIS — B20 Human immunodeficiency virus [HIV] disease: Secondary | ICD-10-CM

## 2021-12-31 DIAGNOSIS — N182 Chronic kidney disease, stage 2 (mild): Secondary | ICD-10-CM | POA: Diagnosis not present

## 2021-12-31 DIAGNOSIS — I10 Essential (primary) hypertension: Secondary | ICD-10-CM | POA: Diagnosis not present

## 2021-12-31 DIAGNOSIS — I7121 Aneurysm of the ascending aorta, without rupture: Secondary | ICD-10-CM | POA: Diagnosis not present

## 2021-12-31 DIAGNOSIS — R9431 Abnormal electrocardiogram [ECG] [EKG]: Secondary | ICD-10-CM | POA: Diagnosis not present

## 2021-12-31 DIAGNOSIS — E7800 Pure hypercholesterolemia, unspecified: Secondary | ICD-10-CM | POA: Diagnosis not present

## 2021-12-31 DIAGNOSIS — R072 Precordial pain: Secondary | ICD-10-CM | POA: Diagnosis not present

## 2021-12-31 DIAGNOSIS — R0602 Shortness of breath: Secondary | ICD-10-CM | POA: Diagnosis not present

## 2021-12-31 MED ORDER — BIKTARVY 50-200-25 MG PO TABS: 1.0000 | ORAL_TABLET | Freq: Every day | ORAL | 5 refills | Status: DC

## 2021-12-31 NOTE — Telephone Encounter (Signed)
Refill Request-Per pt's wife the pt is not to see Dr. Johnnye Sima until Jan with the [facility] office.  Pt's wife states she has already called [facility] and was instructed to call [facility].  BIKTARVY 50-200-25 MG TABS tablet   [facility] [place] [place] [facility] [place]

## 2022-01-13 DIAGNOSIS — I080 Rheumatic disorders of both mitral and aortic valves: Secondary | ICD-10-CM | POA: Diagnosis not present

## 2022-01-15 DIAGNOSIS — Z882 Allergy status to sulfonamides status: Secondary | ICD-10-CM | POA: Diagnosis not present

## 2022-01-15 DIAGNOSIS — J9601 Acute respiratory failure with hypoxia: Secondary | ICD-10-CM | POA: Diagnosis not present

## 2022-01-15 DIAGNOSIS — E279 Disorder of adrenal gland, unspecified: Secondary | ICD-10-CM | POA: Diagnosis not present

## 2022-01-15 DIAGNOSIS — Z880 Allergy status to penicillin: Secondary | ICD-10-CM | POA: Diagnosis not present

## 2022-01-15 DIAGNOSIS — Z20822 Contact with and (suspected) exposure to covid-19: Secondary | ICD-10-CM | POA: Diagnosis not present

## 2022-01-15 DIAGNOSIS — Z21 Asymptomatic human immunodeficiency virus [HIV] infection status: Secondary | ICD-10-CM | POA: Diagnosis not present

## 2022-01-15 DIAGNOSIS — R059 Cough, unspecified: Secondary | ICD-10-CM | POA: Diagnosis not present

## 2022-01-15 DIAGNOSIS — J45901 Unspecified asthma with (acute) exacerbation: Secondary | ICD-10-CM | POA: Diagnosis not present

## 2022-01-15 DIAGNOSIS — I712 Thoracic aortic aneurysm, without rupture, unspecified: Secondary | ICD-10-CM | POA: Diagnosis not present

## 2022-01-15 DIAGNOSIS — R69 Illness, unspecified: Secondary | ICD-10-CM | POA: Diagnosis not present

## 2022-01-15 DIAGNOSIS — Z79899 Other long term (current) drug therapy: Secondary | ICD-10-CM | POA: Diagnosis not present

## 2022-01-15 DIAGNOSIS — F1010 Alcohol abuse, uncomplicated: Secondary | ICD-10-CM | POA: Diagnosis not present

## 2022-01-15 DIAGNOSIS — R079 Chest pain, unspecified: Secondary | ICD-10-CM | POA: Diagnosis not present

## 2022-01-15 DIAGNOSIS — I10 Essential (primary) hypertension: Secondary | ICD-10-CM | POA: Diagnosis not present

## 2022-01-15 DIAGNOSIS — I7121 Aneurysm of the ascending aorta, without rupture: Secondary | ICD-10-CM | POA: Diagnosis not present

## 2022-01-15 DIAGNOSIS — Z789 Other specified health status: Secondary | ICD-10-CM | POA: Diagnosis not present

## 2022-01-15 DIAGNOSIS — R0602 Shortness of breath: Secondary | ICD-10-CM | POA: Diagnosis not present

## 2022-01-15 DIAGNOSIS — E785 Hyperlipidemia, unspecified: Secondary | ICD-10-CM | POA: Diagnosis not present

## 2022-01-15 DIAGNOSIS — E278 Other specified disorders of adrenal gland: Secondary | ICD-10-CM | POA: Diagnosis not present

## 2022-01-15 DIAGNOSIS — Z7982 Long term (current) use of aspirin: Secondary | ICD-10-CM | POA: Diagnosis not present

## 2022-01-16 DIAGNOSIS — J9601 Acute respiratory failure with hypoxia: Secondary | ICD-10-CM | POA: Diagnosis not present

## 2022-01-17 DIAGNOSIS — Z789 Other specified health status: Secondary | ICD-10-CM | POA: Diagnosis not present

## 2022-01-17 DIAGNOSIS — E278 Other specified disorders of adrenal gland: Secondary | ICD-10-CM | POA: Diagnosis not present

## 2022-01-17 DIAGNOSIS — E785 Hyperlipidemia, unspecified: Secondary | ICD-10-CM | POA: Diagnosis not present

## 2022-01-17 DIAGNOSIS — J9601 Acute respiratory failure with hypoxia: Secondary | ICD-10-CM | POA: Diagnosis not present

## 2022-01-17 DIAGNOSIS — I10 Essential (primary) hypertension: Secondary | ICD-10-CM | POA: Diagnosis not present

## 2022-01-28 ENCOUNTER — Encounter: Payer: Self-pay | Admitting: Infectious Diseases

## 2022-01-28 ENCOUNTER — Ambulatory Visit (INDEPENDENT_AMBULATORY_CARE_PROVIDER_SITE_OTHER): Payer: Medicare HMO | Admitting: Infectious Diseases

## 2022-01-28 VITALS — BP 138/85 | HR 69 | Temp 98.1°F | Wt 224.3 lb

## 2022-01-28 DIAGNOSIS — I129 Hypertensive chronic kidney disease with stage 1 through stage 4 chronic kidney disease, or unspecified chronic kidney disease: Secondary | ICD-10-CM

## 2022-01-28 DIAGNOSIS — J45901 Unspecified asthma with (acute) exacerbation: Secondary | ICD-10-CM | POA: Insufficient documentation

## 2022-01-28 DIAGNOSIS — B20 Human immunodeficiency virus [HIV] disease: Secondary | ICD-10-CM | POA: Diagnosis not present

## 2022-01-28 DIAGNOSIS — J4521 Mild intermittent asthma with (acute) exacerbation: Secondary | ICD-10-CM | POA: Diagnosis not present

## 2022-01-28 DIAGNOSIS — N1831 Chronic kidney disease, stage 3a: Secondary | ICD-10-CM | POA: Diagnosis not present

## 2022-01-28 DIAGNOSIS — Z23 Encounter for immunization: Secondary | ICD-10-CM | POA: Diagnosis not present

## 2022-01-28 DIAGNOSIS — I10 Essential (primary) hypertension: Secondary | ICD-10-CM

## 2022-01-28 DIAGNOSIS — R69 Illness, unspecified: Secondary | ICD-10-CM | POA: Diagnosis not present

## 2022-01-28 DIAGNOSIS — E7800 Pure hypercholesterolemia, unspecified: Secondary | ICD-10-CM | POA: Diagnosis not present

## 2022-01-28 MED ORDER — BIKTARVY 50-200-25 MG PO TABS: 1.0000 | ORAL_TABLET | Freq: Every day | ORAL | 5 refills | Status: DC

## 2022-01-28 NOTE — Assessment & Plan Note (Signed)
Will refer him to pulmonary asap Continue albuterol inh.

## 2022-01-28 NOTE — Assessment & Plan Note (Signed)
Cr 1.25 at [facility] Will hold on Renal eval at this point.  Discussed with pt

## 2022-01-28 NOTE — Assessment & Plan Note (Signed)
BP significantly better Will have him f/u with Dr Percival Spanish.

## 2022-01-28 NOTE — Progress Notes (Signed)
Subjective:    Patient ID: Brandon Joyce, male  DOB: [birth year], 71 y.o.        MRN: [id number]   HPI 71 yo M with HIV+, statin related myalgias, hyperlipidemia, erectile dysfunction, hypertension, ETOH use.   He had cardiac cath 07-2013 that showed non-obstr disease. He has thoracic aortic aneurysm as well, repeat CTA 01-2022.  Had surgery on 04-27-15 shoulder due to reported work related injury. This has resolved.  Had another work related injury fall 2018, C6 pinched nerve.  He had a nerve block which helped for ~ 48h. He had c-spine surgery 10-23-17.  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    Got penoplasty 07-2017 for ED.  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 No problems with this.  His Cr has increased some (1.3 now).    Wife is WC bound, prev bed-sore resolved. Has home health nurse helping. Never smoker, wife smokes. She is becoming less mobile (feels like she is a burden).  Has concerns about his parents- father (24 yo) has been falling- hit his head with lat fall (had hypO-Na). Mother with continence issues.  Has help from his sister.  Dad had recent fall.    Has dieted prev and lost sig wt but since parents health declined has increased. He has been unable to stick with his diet.  Wt currently back up slightly.  Drinking 2-3 beer/evening. If football on 5-6.    Was in hospital in [facility] 12-2021 for 2 nights, for asthma, would now like Pulm eval. He was given steroid taper as well as albuterol inh. He had CT chest (-).  Had ? Asthma attack. He was acutely SOB, DOE (going to [employer], cross street to feed the cats at [employer]), worsening over 1 week. He used one of his mom's inhalers which helped.  His RVP was (-). TTE 08-65% Grade 2 diastolic dysfunction.   HIV 1 RNA Quant  Date Value  05/30/2021 NOT DETECTED copies/mL  03/08/2021 Not Detected Copies/mL  05/21/2020 Not Detected Copies/mL   CD4 T Cell Abs (/uL)  Date Value  05/30/2021 612  05/21/2020 627   08/25/2019 530     Health Maintenance  Topic Date Due   Medicare Annual Wellness (AWV)  Never done   COVID-19 Vaccine (1) Never done   Zoster Vaccines- Shingrix (1 of 2) Never done   COLONOSCOPY (Pts 45-65yr Insurance coverage will need to be confirmed)  02/27/2019   INFLUENZA VACCINE  09/24/2021   DTaP/Tdap/Td (2 - Td or Tdap) 12/30/2026   Pneumonia Vaccine 71 Years old  Completed   Hepatitis C Screening  Completed   HPV VACCINES  Aged Out      Review of Systems  Constitutional:  Negative for chills, fever and weight loss (wt u p14#).  Respiratory:  Positive for shortness of breath. Negative for cough and wheezing.   Gastrointestinal:  Negative for constipation and diarrhea.  Genitourinary:  Negative for dysuria.    Please see HPI. All other systems reviewed and negative.     Objective:  Physical Exam Vitals reviewed.  Constitutional:      Appearance: Normal appearance. He is obese.  HENT:     Mouth/Throat:     Mouth: Mucous membranes are moist.     Pharynx: No oropharyngeal exudate.  Cardiovascular:     Rate and Rhythm: Normal rate and regular rhythm.  Pulmonary:     Effort: Pulmonary effort is normal.     Breath sounds: Rhonchi (L base)  present.  Abdominal:     General: Bowel sounds are normal. There is no distension.     Palpations: Abdomen is soft.  Musculoskeletal:        General: Normal range of motion.     Cervical back: Normal range of motion and neck supple.     Right lower leg: Edema (2+) present.     Left lower leg: Edema (2+) present.  Neurological:     Mental Status: He is alert.  Psychiatric:        Mood and Affect: Mood normal.            Assessment & Plan:

## 2022-01-28 NOTE — Assessment & Plan Note (Signed)
Continues on statin.

## 2022-01-28 NOTE — Assessment & Plan Note (Signed)
He is doing well  Will defer repeating until next year Biktarvy refilled Flu shot today He will get Shingles vax.  Rtc in 6 weeks. Defers COVID vax.

## 2022-02-04 ENCOUNTER — Institutional Professional Consult (permissible substitution): Payer: Medicare HMO | Admitting: Pulmonary Disease

## 2022-02-04 NOTE — Progress Notes (Deleted)
Synopsis: Referred in December 2024 for asthma by Bobby Rumpf, MD  Subjective:   PATIENT ID: Brandon Joyce GENDER: male DOB: Sep 19, 1950, MRN: [id number]   HPI  No chief complaint on file.  Brandon Joyce is a 71 year old male, never smoker with HIV, hypertension, CKD IIIa and alcoholism who is referred to pulmonary clinic for asthma.   He was admitted to [facility] 12/2021 for 2 nights due to asthma exacerbation with his breathing worsening over the past few days prior to admission.   CT 01/15/22 at [facility] reviewed. Mild reticular changes at the right medial base and left base. Scattered bronchial wall thickening.   Past Medical History:  Diagnosis Date   Alcoholism ([place])    Chronic kidney disease    followed by nephro   ED (erectile dysfunction)    HIV disease ([place])    HLD (hyperlipidemia)    HTN (hypertension)    Pneumonia      Family History  Problem Relation Age of Onset   Diverticulitis Mother    Heart disease Mother    Atrial fibrillation Mother    Heart disease Father        CABG   Colon cancer Brother      Social History   Socioeconomic History   Marital status: Married    Spouse name: Not on file   Number of children: 3   Years of education: Not on file   Highest education level: GED or equivalent  Occupational History    Employer: [employer]  Tobacco Use   Smoking status: Never   Smokeless tobacco: Never  Vaping Use   Vaping Use: Never used  Substance and Sexual Activity   Alcohol use: Yes    Alcohol/week: 21.0 standard drinks of alcohol    Types: 21 Standard drinks or equivalent per week    Comment: beer and whiskey - has increased   Drug use: No   Sexual activity: Not Currently    Partners: Female    Birth control/protection: Condom    Comment: accepted condoms  Other Topics Concern   Not on file  Social History Narrative   Not on file   Social Determinants of Health   Financial Resource Strain: Not on file  Food  Insecurity: Not on file  Transportation Needs: Not on file  Physical Activity: Not on file  Stress: Not on file  Social Connections: Not on file  Intimate Partner Violence: Not on file     Allergies  Allergen Reactions   Other Rash and Other (See Comments)    Other reaction(s): Other (See Comments), Rash Childhood allergy Childhood allergy Causes "headaches"   Sulfa Antibiotics Rash and Other (See Comments)    Childhood allergy Childhood allergy Other reaction(s): Unknown Other reaction(s): Other (See Comments) Childhood allergy Childhood allergy  Other reaction(s): Other (See Comments) Childhood allergy Other reaction(s): Rash Childhood allergy Other reaction(s): Other (See Comments) Childhood allergy Childhood allergy Other reaction(s): Other (See Comments) Childhood allergy Other reaction(s): Other (See Comments) Childhood allergy   Sulfacetamide Sodium Rash    Other reaction(s): Other (See Comments) Childhood allergy   Sulfasalazine Rash    Other reaction(s): Other (See Comments) Childhood allergy   Atenolol Other (See Comments)    "dropped blood pressure" "dropped blood pressure"   Isosorbide Other (See Comments)    Causes "headaches" Causes "headaches"   Nitrates, Organic     Causes "headaches"   Pentaerythritol Tetranitrate Other (See Comments)    Causes "headaches" Causes "headaches"  Outpatient Medications Prior to Visit  Medication Sig Dispense Refill   amLODipine (NORVASC) 10 MG tablet Take 1 tablet (10 mg total) by mouth daily. 90 tablet 3   atorvastatin (LIPITOR) 20 MG tablet Take 1 tablet by mouth once daily 30 tablet 0   BIKTARVY 50-200-25 MG TABS tablet Take 1 tablet by mouth daily. 90 tablet 5   hydrALAZINE (APRESOLINE) 25 MG tablet Take 1 tablet (25 mg total) by mouth 3 (three) times daily. (Patient not taking: Reported on 03/04/2021) 270 tablet 3   Lisinopril-hydroCHLOROthiazide (ZESTORETIC PO) Take by mouth.     No  facility-administered medications prior to visit.    ROS    Objective:  There were no vitals filed for this visit.   Physical Exam    CBC    Component Value Date/Time   WBC 4.9 05/30/2021 1021   RBC 5.11 05/30/2021 1021   HGB 15.4 05/30/2021 1021   HGB 15.3 11/11/2018 0951   HCT 46.2 05/30/2021 1021   HCT 46.5 11/11/2018 0951   PLT 220 05/30/2021 1021   PLT 213 11/11/2018 0951   MCV 90.4 05/30/2021 1021   MCV 92 11/11/2018 0951   MCH 30.1 05/30/2021 1021   MCHC 33.3 05/30/2021 1021   RDW 14.4 05/30/2021 1021   RDW 14.1 11/11/2018 0951   LYMPHSABS 1.5 11/11/2018 0951   MONOABS 0.5 05/25/2013 1611   EOSABS 0.1 11/11/2018 0951   BASOSABS 0.1 11/11/2018 0951     Chest imaging:  PFT:     No data to display          Labs:  Path:  Echo:  Heart Catheterization:       Assessment & Plan:   No diagnosis found.  Discussion: ***    Current Outpatient Medications:    amLODipine (NORVASC) 10 MG tablet, Take 1 tablet (10 mg total) by mouth daily., Disp: 90 tablet, Rfl: 3   atorvastatin (LIPITOR) 20 MG tablet, Take 1 tablet by mouth once daily, Disp: 30 tablet, Rfl: 0   BIKTARVY 50-200-25 MG TABS tablet, Take 1 tablet by mouth daily., Disp: 90 tablet, Rfl: 5   hydrALAZINE (APRESOLINE) 25 MG tablet, Take 1 tablet (25 mg total) by mouth 3 (three) times daily. (Patient not taking: Reported on 03/04/2021), Disp: 270 tablet, Rfl: 3   Lisinopril-hydroCHLOROthiazide (ZESTORETIC PO), Take by mouth., Disp: , Rfl:

## 2022-02-05 ENCOUNTER — Telehealth: Payer: Self-pay | Admitting: Cardiology

## 2022-02-05 MED ORDER — HYDRALAZINE HCL 25 MG PO TABS: 25.0000 mg | ORAL_TABLET | Freq: Three times a day (TID) | ORAL | 3 refills | Status: DC

## 2022-02-05 NOTE — Telephone Encounter (Signed)
*  STAT* If patient is at the pharmacy, call can be transferred to refill team.   1. Which medications need to be refilled? (please list name of each medication and dose if known)   hydrALAZINE (APRESOLINE) 25 MG tablet (Expired)   2. Which pharmacy/location (including street and city if local pharmacy) is medication to be sent to?  [place] [address]   3. Do they need a 30 day or 90 day supply? 90 day  Wife stated patient has 4 tablets left.

## 2022-02-11 DIAGNOSIS — R0602 Shortness of breath: Secondary | ICD-10-CM | POA: Diagnosis not present

## 2022-02-11 DIAGNOSIS — I10 Essential (primary) hypertension: Secondary | ICD-10-CM | POA: Diagnosis not present

## 2022-02-11 DIAGNOSIS — E7800 Pure hypercholesterolemia, unspecified: Secondary | ICD-10-CM | POA: Diagnosis not present

## 2022-02-11 DIAGNOSIS — I7121 Aneurysm of the ascending aorta, without rupture: Secondary | ICD-10-CM | POA: Diagnosis not present

## 2022-02-26 ENCOUNTER — Institutional Professional Consult (permissible substitution): Payer: Medicare HMO | Admitting: Pulmonary Disease

## 2022-02-26 NOTE — Progress Notes (Deleted)
Synopsis: Referred in January 2024 for asthma by Bobby Rumpf, MD  Subjective:   PATIENT ID: Brandon Joyce GENDER: male DOB: [birth year], MRN: [id number]   HPI  No chief complaint on file.  Brandon Joyce is a 72 year old male, never smoker with HIV, hypertension, CKD and alcohol abuse who is referred to pulmonary clinic for asthma.   He was admitted in 12/2021 for asthma exacerbation at [facility]. Respiratory viral panel was negative. He required supplemental oxygen initially but was discharged on room air. He was sent home with steroid taper.   Past Medical History:  Diagnosis Date   Alcoholism ([place])    Chronic kidney disease    followed by nephro   ED (erectile dysfunction)    HIV disease ([place])    HLD (hyperlipidemia)    HTN (hypertension)    Pneumonia      Family History  Problem Relation Age of Onset   Diverticulitis Mother    Heart disease Mother    Atrial fibrillation Mother    Heart disease Father        CABG   Colon cancer Brother      Social History   Socioeconomic History   Marital status: Married    Spouse name: Not on file   Number of children: 3   Years of education: Not on file   Highest education level: GED or equivalent  Occupational History    Employer: [employer]  Tobacco Use   Smoking status: Never   Smokeless tobacco: Never  Vaping Use   Vaping Use: Never used  Substance and Sexual Activity   Alcohol use: Yes    Alcohol/week: 21.0 standard drinks of alcohol    Types: 21 Standard drinks or equivalent per week    Comment: beer and whiskey - has increased   Drug use: No   Sexual activity: Not Currently    Partners: Female    Birth control/protection: Condom    Comment: accepted condoms  Other Topics Concern   Not on file  Social History Narrative   Not on file   Social Determinants of Health   Financial Resource Strain: Not on file  Food Insecurity: Not on file  Transportation Needs: Not on file  Physical  Activity: Not on file  Stress: Not on file  Social Connections: Not on file  Intimate Partner Violence: Not on file     Allergies  Allergen Reactions   Other Rash and Other (See Comments)    Other reaction(s): Other (See Comments), Rash Childhood allergy Childhood allergy Causes "headaches"   Sulfa Antibiotics Rash and Other (See Comments)    Childhood allergy Childhood allergy Other reaction(s): Unknown Other reaction(s): Other (See Comments) Childhood allergy Childhood allergy  Other reaction(s): Other (See Comments) Childhood allergy Other reaction(s): Rash Childhood allergy Other reaction(s): Other (See Comments) Childhood allergy Childhood allergy Other reaction(s): Other (See Comments) Childhood allergy Other reaction(s): Other (See Comments) Childhood allergy   Sulfacetamide Sodium Rash    Other reaction(s): Other (See Comments) Childhood allergy   Sulfasalazine Rash    Other reaction(s): Other (See Comments) Childhood allergy   Atenolol Other (See Comments)    "dropped blood pressure" "dropped blood pressure"   Isosorbide Other (See Comments)    Causes "headaches" Causes "headaches"   Nitrates, Organic     Causes "headaches"   Pentaerythritol Tetranitrate Other (See Comments)    Causes "headaches" Causes "headaches"     Outpatient Medications Prior to Visit  Medication Sig Dispense Refill  amLODipine (NORVASC) 10 MG tablet Take 1 tablet (10 mg total) by mouth daily. 90 tablet 3   atorvastatin (LIPITOR) 20 MG tablet Take 1 tablet by mouth once daily 30 tablet 0   BIKTARVY 50-200-25 MG TABS tablet Take 1 tablet by mouth daily. 90 tablet 5   hydrALAZINE (APRESOLINE) 25 MG tablet Take 1 tablet (25 mg total) by mouth 3 (three) times daily. 270 tablet 3   Lisinopril-hydroCHLOROthiazide (ZESTORETIC PO) Take by mouth.     No facility-administered medications prior to visit.    ROS    Objective:  There were no vitals filed for this  visit.   Physical Exam    CBC    Component Value Date/Time   WBC 4.9 05/30/2021 1021   RBC 5.11 05/30/2021 1021   HGB 15.4 05/30/2021 1021   HGB 15.3 11/11/2018 0951   HCT 46.2 05/30/2021 1021   HCT 46.5 11/11/2018 0951   PLT 220 05/30/2021 1021   PLT 213 11/11/2018 0951   MCV 90.4 05/30/2021 1021   MCV 92 11/11/2018 0951   MCH 30.1 05/30/2021 1021   MCHC 33.3 05/30/2021 1021   RDW 14.4 05/30/2021 1021   RDW 14.1 11/11/2018 0951   LYMPHSABS 1.5 11/11/2018 0951   MONOABS 0.5 05/25/2013 1611   EOSABS 0.1 11/11/2018 0951   BASOSABS 0.1 11/11/2018 0951      Latest Ref Rng & Units 05/30/2021   10:21 AM 03/08/2021    9:12 AM 05/21/2020    2:54 PM  BMP  Glucose 65 - 99 mg/dL 123  104  114   BUN 7 - 25 mg/dL '14  17  14   '$ Creatinine 0.70 - 1.28 mg/dL 1.30  1.27  1.55   BUN/Creat Ratio 6 - 22 (calc) 11  NOT APPLICABLE  9   Sodium 676 - 146 mmol/L 137  138  138   Potassium 3.5 - 5.3 mmol/L 3.9  4.0  4.0   Chloride 98 - 110 mmol/L 105  105  103   CO2 20 - 32 mmol/L '23  24  25   '$ Calcium 8.6 - 10.3 mg/dL 9.4  9.4  9.2    Chest imaging: CT Chest 01/15/22 1.  No acute findings.  2.  2.6 cm indeterminate right adrenal gland nodule. Nonemergent MRI can be performed for further evaluation.  3.  Ascending thoracic aortic aneurysm measuring 4.2 cm in AP dimension, unchanged.  PFT:     No data to display          Labs:  Path:  Echo:  Heart Catheterization:  Assessment & Plan:   No diagnosis found.  Discussion: ***    Current Outpatient Medications:    amLODipine (NORVASC) 10 MG tablet, Take 1 tablet (10 mg total) by mouth daily., Disp: 90 tablet, Rfl: 3   atorvastatin (LIPITOR) 20 MG tablet, Take 1 tablet by mouth once daily, Disp: 30 tablet, Rfl: 0   BIKTARVY 50-200-25 MG TABS tablet, Take 1 tablet by mouth daily., Disp: 90 tablet, Rfl: 5   hydrALAZINE (APRESOLINE) 25 MG tablet, Take 1 tablet (25 mg total) by mouth 3 (three) times daily., Disp: 270 tablet, Rfl:  3   Lisinopril-hydroCHLOROthiazide (ZESTORETIC PO), Take by mouth., Disp: , Rfl:

## 2022-03-04 ENCOUNTER — Other Ambulatory Visit (HOSPITAL_COMMUNITY)
Admission: RE | Admit: 2022-03-04 | Discharge: 2022-03-04 | Disposition: A | Discharge status: Home / Self Care | Payer: Medicare HMO | Source: Ambulatory Visit | Attending: Infectious Diseases | Admitting: Infectious Diseases

## 2022-03-04 ENCOUNTER — Other Ambulatory Visit: Payer: Self-pay

## 2022-03-04 ENCOUNTER — Ambulatory Visit (INDEPENDENT_AMBULATORY_CARE_PROVIDER_SITE_OTHER): Payer: Medicare HMO | Admitting: Infectious Diseases

## 2022-03-04 ENCOUNTER — Other Ambulatory Visit: Payer: Medicare HMO

## 2022-03-04 ENCOUNTER — Encounter: Payer: Self-pay | Admitting: Infectious Diseases

## 2022-03-04 VITALS — BP 139/90 | HR 71 | Temp 98.4°F | Resp 28 | Ht 68.0 in | Wt 223.6 lb

## 2022-03-04 DIAGNOSIS — Z113 Encounter for screening for infections with a predominantly sexual mode of transmission: Secondary | ICD-10-CM | POA: Diagnosis present

## 2022-03-04 DIAGNOSIS — J4521 Mild intermittent asthma with (acute) exacerbation: Secondary | ICD-10-CM | POA: Diagnosis not present

## 2022-03-04 DIAGNOSIS — Z79899 Other long term (current) drug therapy: Secondary | ICD-10-CM

## 2022-03-04 DIAGNOSIS — B20 Human immunodeficiency virus [HIV] disease: Secondary | ICD-10-CM | POA: Insufficient documentation

## 2022-03-04 DIAGNOSIS — I10 Essential (primary) hypertension: Secondary | ICD-10-CM

## 2022-03-04 MED ORDER — ALBUTEROL SULFATE HFA 108 (90 BASE) MCG/ACT IN AERS: 2.0000 | INHALATION_SPRAY | Freq: Four times a day (QID) | RESPIRATORY_TRACT | 2 refills | Status: DC | PRN

## 2022-03-04 NOTE — Addendum Note (Signed)
Addended by: Truddie Crumble on: 03/04/2022 03:41 PM   Modules accepted: Orders

## 2022-03-04 NOTE — Assessment & Plan Note (Signed)
He has f/u appt in 3 weeks Will check his labs today.

## 2022-03-04 NOTE — Assessment & Plan Note (Signed)
Fairly stable.  He feels he is better Has f/u appt with Dr Nelda Marseille.  I have refilled his inhaler.

## 2022-03-04 NOTE — Assessment & Plan Note (Signed)
Sl elevated.  Appreciate pcp f/u.

## 2022-03-04 NOTE — Progress Notes (Signed)
Subjective:    Patient ID: Brandon Joyce, male  DOB: 1950/03/07, 72 y.o.        MRN: [id number]   HPI 72 yo M with HIV+, statin related myalgias, hyperlipidemia, erectile dysfunction, hypertension, ETOH use.   He had cardiac cath 07-2013 that showed non-obstr disease. He has thoracic aortic aneurysm as well, repeat CTA 01-2022.  Had surgery on 04-27-15 shoulder due to reported work related injury. This has resolved.  Had another work related injury fall 2018, C6 pinched nerve.  He had a nerve block which helped for ~ 48h. He had c-spine surgery 10-23-17.  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    Got penoplasty 07-2017 for ED.  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 No problems with this.  His Cr has increased some (1.25 last).    Wife is WC bound, prev bed-sore resolved. Has home health nurse helping. Never smoker, wife smokes. She is becoming less mobile (feels like she is a burden).  Has concerns about his parents- father (73 yo) has been falling- hit his head with lat fall (had hypO-Na). Mother with continence issues.  Has help from his sister.  Dad had recent fall.    Has dieted prev and lost sig wt but since parents health declined has increased. He has been unable to stick with his diet.  Wt currently back up slightly.  Drinking 2-3 beer/evening. If football on 5-6.     Was in hospital in [facility] 12-2021 for 2 nights, for asthma, would now like Pulm eval. He was given steroid taper as well as albuterol inh. He had CT chest (-).  Had ? Asthma attack. He was acutely SOB, DOE (going to [employer], cross street to feed the cats at [employer]), worsening over 1 week. He used one of his mom's inhalers which helped.  His RVP was (-). TTE 53-97% Grade 2 diastolic dysfunction.   At his last visit, he was having worsening SOB.  He was referred back to pulm. Has not seen them yet. Has been better except with exertion. He has seen CV, they did not feel that it was cardiac.  He was referred  to [facility] pulm as well.    HIV 1 RNA Quant  Date Value  05/30/2021 NOT DETECTED copies/mL  03/08/2021 Not Detected Copies/mL  05/21/2020 Not Detected Copies/mL   CD4 T Cell Abs (/uL)  Date Value  05/30/2021 612  05/21/2020 627  08/25/2019 530     Health Maintenance  Topic Date Due   Medicare Annual Wellness (AWV)  Never done   COVID-19 Vaccine (1) Never done   Zoster Vaccines- Shingrix (1 of 2) Never done   COLONOSCOPY (Pts 45-106yr Insurance coverage will need to be confirmed)  02/27/2019   DTaP/Tdap/Td (2 - Td or Tdap) 12/30/2026   Pneumonia Vaccine 72 Years old  Completed   INFLUENZA VACCINE  Completed   Hepatitis C Screening  Completed   HPV VACCINES  Aged Out      Review of Systems  Constitutional:  Negative for chills, fever and weight loss.  Respiratory:  Positive for cough, sputum production, shortness of breath and wheezing.   Cardiovascular:  Negative for chest pain.  Gastrointestinal:  Negative for constipation and diarrhea.  Genitourinary:  Negative for dysuria.    Please see HPI. All other systems reviewed and negative.     Objective:  Physical Exam Vitals reviewed.  Constitutional:      Appearance: Normal appearance.  HENT:  Mouth/Throat:     Mouth: Mucous membranes are moist.     Pharynx: No oropharyngeal exudate.  Eyes:     Extraocular Movements: Extraocular movements intact.     Pupils: Pupils are equal, round, and reactive to light.  Cardiovascular:     Rate and Rhythm: Normal rate and regular rhythm.  Pulmonary:     Effort: Pulmonary effort is normal. No respiratory distress.     Breath sounds: Decreased air movement present.  Abdominal:     General: Bowel sounds are normal. There is no distension.     Palpations: Abdomen is soft.     Tenderness: There is no abdominal tenderness.  Musculoskeletal:     Right lower leg: No edema.     Left lower leg: No edema.  Neurological:     Mental Status: He is alert.  Psychiatric:         Mood and Affect: Mood normal.            Assessment & Plan:

## 2022-03-05 LAB — COMPREHENSIVE METABOLIC PANEL
ALT: 35 IU/L (ref 0–44)
AST: 22 IU/L (ref 0–40)
Albumin/Globulin Ratio: 2.5 — ABNORMAL HIGH (ref 1.2–2.2)
Albumin: 4.9 g/dL — ABNORMAL HIGH (ref 3.8–4.8)
Alkaline Phosphatase: 64 IU/L (ref 44–121)
BUN/Creatinine Ratio: 7 — ABNORMAL LOW (ref 10–24)
BUN: 9 mg/dL (ref 8–27)
Bilirubin Total: 0.4 mg/dL (ref 0.0–1.2)
CO2: 20 mmol/L (ref 20–29)
Calcium: 9.1 mg/dL (ref 8.6–10.2)
Chloride: 104 mmol/L (ref 96–106)
Creatinine, Ser: 1.29 mg/dL — ABNORMAL HIGH (ref 0.76–1.27)
Globulin, Total: 2 g/dL (ref 1.5–4.5)
Glucose: 89 mg/dL (ref 70–99)
Potassium: 4.1 mmol/L (ref 3.5–5.2)
Sodium: 140 mmol/L (ref 134–144)
Total Protein: 6.9 g/dL (ref 6.0–8.5)
eGFR: 59 mL/min/{1.73_m2} — ABNORMAL LOW (ref >59)

## 2022-03-05 LAB — URINE CYTOLOGY ANCILLARY ONLY
Chlamydia: NEGATIVE
Comment: NEGATIVE
Comment: NORMAL
Neisseria Gonorrhea: NEGATIVE

## 2022-03-05 LAB — CBC
Hematocrit: 45.6 % (ref 37.5–51.0)
Hemoglobin: 15.5 g/dL (ref 13.0–17.7)
MCH: 29.9 pg (ref 26.6–33.0)
MCHC: 34 g/dL (ref 31.5–35.7)
MCV: 88 fL (ref 79–97)
Platelets: 226 10*3/uL (ref 150–450)
RBC: 5.19 x10E6/uL (ref 4.14–5.80)
RDW: 14.2 % (ref 11.6–15.4)
WBC: 6.3 10*3/uL (ref 3.4–10.8)

## 2022-03-05 LAB — LIPID PANEL
Chol/HDL Ratio: 4.4 ratio (ref 0.0–5.0)
Cholesterol, Total: 164 mg/dL (ref 100–199)
HDL: 37 mg/dL — ABNORMAL LOW (ref >39)
LDL Chol Calc (NIH): 76 mg/dL (ref 0–99)
Triglycerides: 315 mg/dL — ABNORMAL HIGH (ref 0–149)
VLDL Cholesterol Cal: 51 mg/dL — ABNORMAL HIGH (ref 5–40)

## 2022-03-05 LAB — HIV-1 RNA QUANT-NO REFLEX-BLD: HIV-1 RNA Viral Load: <20 copies/mL

## 2022-03-05 LAB — RPR: RPR Ser Ql: NONREACTIVE

## 2022-03-06 LAB — HELPER T-LYMPH-CD4 (ARMC ONLY)
% CD 4 Pos. Lymph.: 38.2 % (ref 30.8–58.5)
Absolute CD 4 Helper: 726 /uL (ref 359–1519)
Basophils Absolute: 0 10*3/uL (ref 0.0–0.2)
Basos: 1 %
EOS (ABSOLUTE): 0.3 10*3/uL (ref 0.0–0.4)
Eos: 5 %
Hematocrit: 47.5 % (ref 37.5–51.0)
Hemoglobin: 15.7 g/dL (ref 13.0–17.7)
Immature Grans (Abs): 0 10*3/uL (ref 0.0–0.1)
Immature Granulocytes: 0 %
Lymphocytes Absolute: 1.9 10*3/uL (ref 0.7–3.1)
Lymphs: 32 %
MCH: 29.9 pg (ref 26.6–33.0)
MCHC: 33.1 g/dL (ref 31.5–35.7)
MCV: 91 fL (ref 79–97)
Monocytes Absolute: 0.6 10*3/uL (ref 0.1–0.9)
Monocytes: 10 %
Neutrophils Absolute: 3.1 10*3/uL (ref 1.4–7.0)
Neutrophils: 52 %
Platelets: 215 10*3/uL (ref 150–450)
RBC: 5.25 x10E6/uL (ref 4.14–5.80)
RDW: 14.9 % (ref 11.6–15.4)
WBC: 5.9 10*3/uL (ref 3.4–10.8)

## 2022-03-11 ENCOUNTER — Other Ambulatory Visit: Payer: Medicare HMO

## 2022-03-24 ENCOUNTER — Ambulatory Visit: Payer: Medicare HMO | Admitting: Cardiology

## 2022-03-24 ENCOUNTER — Encounter: Payer: Self-pay | Admitting: Cardiology

## 2022-03-24 VITALS — BP 147/87 | HR 66 | Ht 68.0 in | Wt 219.0 lb

## 2022-03-24 DIAGNOSIS — I10 Essential (primary) hypertension: Secondary | ICD-10-CM

## 2022-03-24 DIAGNOSIS — I712 Thoracic aortic aneurysm, without rupture, unspecified: Secondary | ICD-10-CM

## 2022-03-24 DIAGNOSIS — E7800 Pure hypercholesterolemia, unspecified: Secondary | ICD-10-CM | POA: Diagnosis not present

## 2022-03-24 MED ORDER — HYDRALAZINE HCL 50 MG PO TABS: 50.0000 mg | ORAL_TABLET | Freq: Three times a day (TID) | ORAL | 3 refills | Status: DC

## 2022-03-24 NOTE — Patient Instructions (Signed)
Medication Instructions:   INCREASE HYDRALAZINE TO 50 MG THREE TIMES DAILY= 2 OF THE 25 MG TABLETS THREE TIMES DAILY  *If you need a refill on your cardiac medications before your next appointment, please call your pharmacy*   Follow-Up: At [facility], you and your health needs are our priority.  As part of our continuing mission to provide you with exceptional heart care, we have created designated Provider Care Teams.  These Care Teams include your primary Cardiologist (physician) and Advanced Practice Providers (APPs -  Physician Assistants and Nurse Practitioners) who all work together to provide you with the care you need, when you need it.  We recommend signing up for the patient portal called "MyChart".  Sign up information is provided on this After Visit Summary.  MyChart is used to connect with patients for Virtual Visits (Telemedicine).  Patients are able to view lab/test results, encounter notes, upcoming appointments, etc.  Non-urgent messages can be sent to your provider as well.   To learn more about what you can do with MyChart, go to [email or web address].    Your next appointment:   6 month(s)  Provider:   You will follow up in the [facility] located at [facility]. Your provider will be:  Kirk Ruths, MD

## 2022-03-24 NOTE — Progress Notes (Signed)
HPI: FU hypertension. Nuclear study May of 2015 was normal with ejection fraction 59%. Cardiac catheterization June 2015 showed minimal nonobstructive coronary disease and normal LV function. Had atenolol DCed previously due to bradycardia.  Echocardiogram at [facility] November 2023 showed normal LV function, mild left ventricular hypertrophy, grade 2 diastolic dysfunction.  CT November 2023 at [facility] showed 2.6 cm right adrenal gland nodule and MRI recommended, ascending thoracic aortic aneurysm measuring 4.2 cm.  Since last seen, he has had difficulties with dyspnea on exertion since November.  His symptoms are better now compared to November.  There is no orthopnea, PND, pedal edema, chest pain or syncope.  He complains of some "drainage" in his throat.  Current Outpatient Medications  Medication Sig Dispense Refill   albuterol (VENTOLIN HFA) 108 (90 Base) MCG/ACT inhaler Inhale 2 puffs into the lungs every 6 (six) hours as needed for wheezing or shortness of breath. 8 g 2   amLODipine (NORVASC) 10 MG tablet Take 1 tablet (10 mg total) by mouth daily. 90 tablet 3   atorvastatin (LIPITOR) 20 MG tablet Take 1 tablet by mouth once daily 30 tablet 0   BIKTARVY 50-200-25 MG TABS tablet Take 1 tablet by mouth daily. 90 tablet 5   hydrALAZINE (APRESOLINE) 25 MG tablet Take 1 tablet (25 mg total) by mouth 3 (three) times daily. 270 tablet 3   Lisinopril-hydroCHLOROthiazide (ZESTORETIC PO) Take by mouth.     No current facility-administered medications for this visit.     Past Medical History:  Diagnosis Date   Alcoholism ([place])    Chronic kidney disease    followed by nephro   ED (erectile dysfunction)    HIV disease ([place])    HLD (hyperlipidemia)    HTN (hypertension)    Pneumonia     Past Surgical History:  Procedure Laterality Date   BACK SURGERY     INCISION AND DRAINAGE ABSCESS N/A 12/07/2019   Procedure: INCISION AND DRAINAGE BUTTOCK ABSCESS;  Surgeon: Jovita Kussmaul, MD;   Location: [facility];  Service: General;  Laterality: N/A;   LEFT HEART CATHETERIZATION WITH CORONARY ANGIOGRAM N/A 07/28/2013   Procedure: LEFT HEART CATHETERIZATION WITH CORONARY ANGIOGRAM;  Surgeon: Jettie Booze, MD;  Location: [facility] CATH LAB;  Service: Cardiovascular;  Laterality: N/A;   MASS EXCISION Left 01/25/2020   Procedure: 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;  Surgeon: Jovita Kussmaul, MD;  Location: [facility];  Service: General;  Laterality: Left;   ROTATOR CUFF REPAIR Right 04-27-2015   TONSILLECTOMY      Social History   Socioeconomic History   Marital status: Married    Spouse name: Not on file   Number of children: 3   Years of education: Not on file   Highest education level: GED or equivalent  Occupational History    Employer: [employer]  Tobacco Use   Smoking status: Never   Smokeless tobacco: Never  Vaping Use   Vaping Use: Never used  Substance and Sexual Activity   Alcohol use: Yes    Alcohol/week: 21.0 standard drinks of alcohol    Types: 21 Standard drinks or equivalent per week    Comment: beer and whiskey - has increased   Drug use: No   Sexual activity: Not Currently    Partners: Female    Birth control/protection: Condom    Comment: accepted condoms  Other Topics Concern   Not on file  Social History Narrative   Not  on file   Social Determinants of Health   Financial Resource Strain: Not on file  Food Insecurity: No Food Insecurity (03/04/2022)   Hunger Vital Sign    Worried About Running Out of Food in the Last Year: Never true    Ran Out of Food in the Last Year: Never true  Transportation Needs: No Transportation Needs (03/04/2022)   PRAPARE - Hydrologist (Medical): No    Lack of Transportation (Non-Medical): No  Physical Activity: Not on file  Stress: Not on file  Social Connections: Moderately Isolated (03/04/2022)   Social Connection and Isolation Panel  [NHANES]    Frequency of Communication with Friends and Family: Twice a week    Frequency of Social Gatherings with Friends and Family: Twice a week    Attends Religious Services: Never    Marine scientist or Organizations: No    Attends Archivist Meetings: Never    Marital Status: Married  Human resources officer Violence: Not At Risk (03/04/2022)   Humiliation, Afraid, Rape, and Kick questionnaire    Fear of Current or Ex-Partner: No    Emotionally Abused: No    Physically Abused: No    Sexually Abused: No    Family History  Problem Relation Age of Onset   Diverticulitis Mother    Heart disease Mother    Atrial fibrillation Mother    Heart disease Father        CABG   Colon cancer Brother     ROS: no fevers or chills, productive cough, hemoptysis, dysphasia, odynophagia, melena, hematochezia, dysuria, hematuria, rash, seizure activity, orthopnea, PND, pedal edema, claudication. Remaining systems are negative.  Physical Exam: Well-developed well-nourished in no acute distress.  Skin is warm and dry.  HEENT is normal.  Neck is supple.  Chest with mild wheeze with forced expiration. Cardiovascular exam is regular rate and rhythm.  2/6 systolic murmur left sternal border. Abdominal exam nontender or distended. No masses palpated. Extremities show no edema. neuro grossly intact  ECG-normal sinus rhythm at a rate of 66, lateral T wave inversion.  No change compared to March 04, 2021.  Personally reviewed  A/P  1 hypertension-blood pressure elevated; increase hydralazine to 50 mg p.o. 3 times daily and follow.  2 history of thoracic aortic aneurysm-patient will need follow-up CTA November 2024.  3 adrenal nodule-noted on previous CT at [facility].  Have asked him to follow-up with his primary care physician for this issue.  4 hyperlipidemia-continue statin.  5 HIV-Per primary care.  6 dyspnea-etiology unclear.  He is not volume overloaded on examination.  He does  have a mild expiratory wheeze.  He is trying to schedule an evaluation with pulmonary.  Kirk Ruths, MD

## 2022-03-26 ENCOUNTER — Encounter: Payer: Self-pay | Admitting: Infectious Diseases

## 2022-03-26 ENCOUNTER — Ambulatory Visit (INDEPENDENT_AMBULATORY_CARE_PROVIDER_SITE_OTHER): Payer: Medicare HMO | Admitting: Infectious Diseases

## 2022-03-26 VITALS — BP 131/85 | HR 74 | Temp 98.0°F | Ht 68.0 in | Wt 221.5 lb

## 2022-03-26 DIAGNOSIS — J4521 Mild intermittent asthma with (acute) exacerbation: Secondary | ICD-10-CM | POA: Diagnosis not present

## 2022-03-26 DIAGNOSIS — I129 Hypertensive chronic kidney disease with stage 1 through stage 4 chronic kidney disease, or unspecified chronic kidney disease: Secondary | ICD-10-CM

## 2022-03-26 DIAGNOSIS — F1020 Alcohol dependence, uncomplicated: Secondary | ICD-10-CM | POA: Diagnosis not present

## 2022-03-26 DIAGNOSIS — N1831 Chronic kidney disease, stage 3a: Secondary | ICD-10-CM | POA: Diagnosis not present

## 2022-03-26 DIAGNOSIS — B20 Human immunodeficiency virus [HIV] disease: Secondary | ICD-10-CM

## 2022-03-26 DIAGNOSIS — I10 Essential (primary) hypertension: Secondary | ICD-10-CM

## 2022-03-26 MED ORDER — AMLODIPINE BESYLATE 10 MG PO TABS: 10.0000 mg | ORAL_TABLET | Freq: Every day | ORAL | 3 refills | Status: DC

## 2022-03-26 MED ORDER — BUDESONIDE-FORMOTEROL FUMARATE 160-4.5 MCG/ACT IN AERO: 2.0000 | INHALATION_SPRAY | Freq: Two times a day (BID) | RESPIRATORY_TRACT | 12 refills | Status: DC

## 2022-03-26 MED ORDER — FLUTICASONE PROPIONATE 50 MCG/ACT NA SUSP: 1.0000 | Freq: Every day | NASAL | 2 refills | Status: DC

## 2022-03-26 NOTE — Assessment & Plan Note (Addendum)
Will add flonase as well as symbicort. Rtc in 1-2 months Re-try pulm eval.

## 2022-03-26 NOTE — Assessment & Plan Note (Signed)
Has been stable. Will continue to watch.

## 2022-03-26 NOTE — Assessment & Plan Note (Signed)
Encouraged encourage moderation, abstinence.

## 2022-03-26 NOTE — Progress Notes (Signed)
Subjective:    Patient ID: Brandon Joyce, male  DOB: [birth year], 72 y.o.        MRN: [id number]   HPI 72 yo M with HIV+, statin related myalgias, hyperlipidemia, erectile dysfunction, hypertension, ETOH use.  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 He has thoracic aortic aneurysm as well, repeat CTA 01-2022.  Had surgery on 04-27-15 shoulder due to reported work related injury. This has resolved.  Had another work related injury fall 2018, C6 pinched nerve.  He had a nerve block which helped for ~ 48h. He had c-spine surgery 10-23-17.  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    Got penoplasty 07-2017 for ED.  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 No problems with this.  His Cr has increased some (1.25 last).    Wife is WC bound, prev bed-sore resolved. Has home health nurse helping. Never smoker, wife smokes. She is becoming less mobile (feels like she is a burden).  Has concerns about his parents- father (50 yo) has been falling- hit his head with lat fall (had hypO-Na). Mother with continence issues.  Has help from his sister.  Dad deceased 2021/09/20.     Was in hospital in [facility] 12-2021 for 2 nights, for asthma, would now like Pulm eval. He was given steroid taper as well as albuterol inh. He had CT chest (-).  Had ? Asthma attack. He was acutely SOB, DOE (going to [employer], cross street to feed the cats at [employer]), worsening over 1 week. He used one of his mom's inhalers which helped.  His RVP was (-). TTE 33-82% Grade 2 diastolic dysfunction.    At his last visit, he was having worsening SOB.  He was referred back to pulm. Missed appt earlier this month.  He was seen in ID earlier this month- re-reffered to pulm.  He was unable to get through to the pulm office when he was scheduling.   Seen by CV last week. Hydralazine dose increased.   In office he has tight, cough.  Has been using albuterol- 4-5/day.  Never smoker.  Etoh use unchanged.  Wife  has "real bad head cold".   HIV 1 RNA Quant  Date Value  05/30/2021 NOT DETECTED copies/mL  03/08/2021 Not Detected Copies/mL  05/21/2020 Not Detected Copies/mL   HIV-1 RNA Viral Load (copies/mL)  Date Value  03/04/2022 <20   CD4 T Cell Abs (/uL)  Date Value  05/30/2021 612  05/21/2020 627  08/25/2019 530     Health Maintenance  Topic Date Due  . Medicare Annual Wellness (AWV)  Never done  . COVID-19 Vaccine (1) Never done  . Zoster Vaccines- Shingrix (1 of 2) Never done  . COLONOSCOPY (Pts 45-53yr Insurance coverage will need to be confirmed)  02/27/2019  . DTaP/Tdap/Td (2 - Td or Tdap) 12/30/2026  . Pneumonia Vaccine 72 Years old  Completed  . INFLUENZA VACCINE  Completed  . Hepatitis C Screening  Completed  . HPV VACCINES  Aged Out      Review of Systems  Constitutional:  Negative for chills and fever.  HENT:  Positive for congestion (awakens him at night. using phenylephrine nasal spray.).   Respiratory:  Positive for cough, shortness of breath (able to walk 1/2 way to clinic from parking deck, needed to rest. DOE.) and wheezing.   Cardiovascular:  Negative for chest pain.  Gastrointestinal:  Negative for constipation and diarrhea.  Genitourinary:  Negative for dysuria.  Neurological:  Negative for headaches.    Please see HPI. All other systems reviewed and negative.     Objective:  Physical Exam Vitals reviewed.  Constitutional:      Appearance: Normal appearance.  HENT:     Nose: No congestion or rhinorrhea.     Mouth/Throat:     Mouth: Mucous membranes are moist.     Pharynx: No oropharyngeal exudate.  Cardiovascular:     Rate and Rhythm: Normal rate and regular rhythm.  Pulmonary:     Effort: Pulmonary effort is normal.     Breath sounds: Normal breath sounds.  Abdominal:     General: Bowel sounds are normal. There is no distension.     Palpations: Abdomen is soft.     Tenderness: There is no abdominal tenderness.  Musculoskeletal:      Cervical back: Normal range of motion and neck supple.     Right lower leg: No edema.     Left lower leg: No edema.  Neurological:     General: No focal deficit present.     Mental Status: He is alert.  Psychiatric:        Mood and Affect: Mood normal.          Assessment & Plan:

## 2022-03-26 NOTE — Assessment & Plan Note (Signed)
Appreciate CV f/u Doing well on newer regimen.

## 2022-03-26 NOTE — Assessment & Plan Note (Signed)
Doing well Continues his biktarvy Rtc/recheck in 9 months.

## 2022-04-05 IMAGING — CT CT ABD-PELV W/O CM
1 of 2 series · 14 of 32 positions shown, 18 images · non-contrast
Comparison: Ultrasound dated 10/27/2019.

CLINICAL DATA: Follow-up of ultrasound dated 10/27/2019.

EXAM:
CT ABDOMEN AND PELVIS WITHOUT CONTRAST
TECHNIQUE: Multidetector CT imaging of the abdomen and pelvis was performed
following the standard protocol without IV contrast.

[Series 2: abd/pelvis w/(date) · axial · 0.86mm/px · z∈[-482,-52]mm · 14 of 96 slices shown, 18 images]
[im 5/96  soft-tissue]
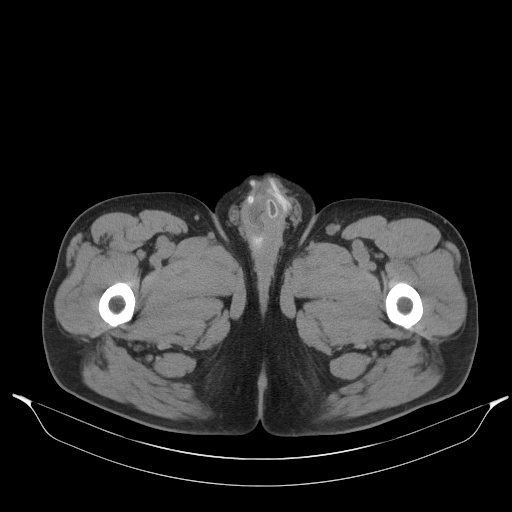
[im 5/96  bone]
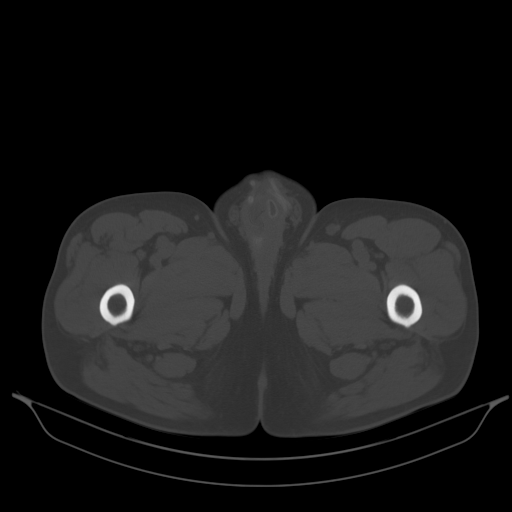
[im 14/96  soft-tissue]
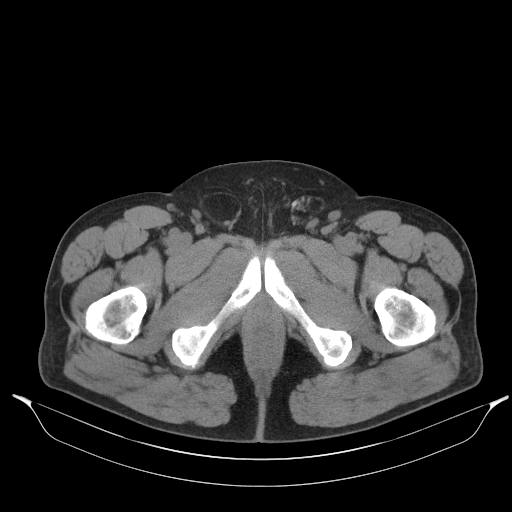
[im 23/96  soft-tissue]
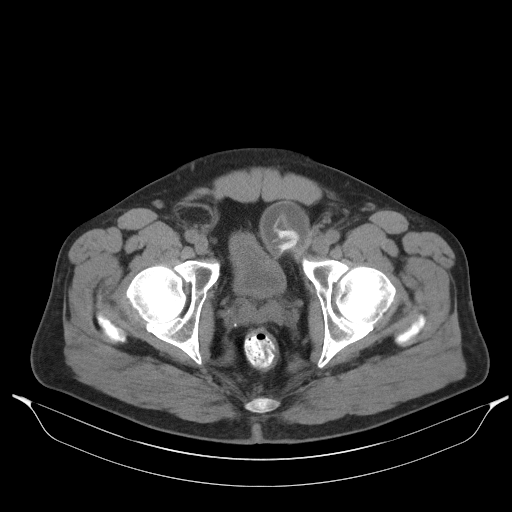
[im 28/96  soft-tissue]
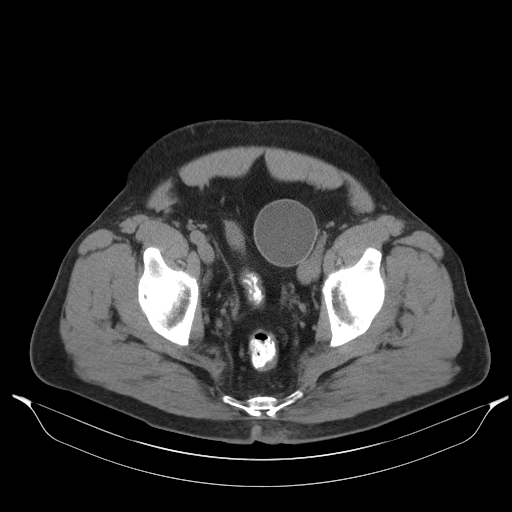
[im 37/96  soft-tissue]
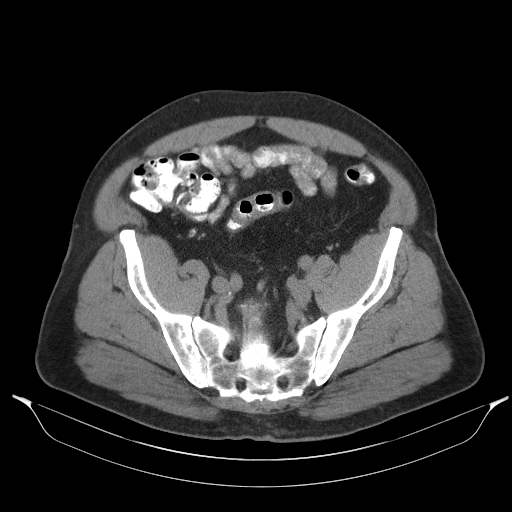
[im 46/96  soft-tissue]
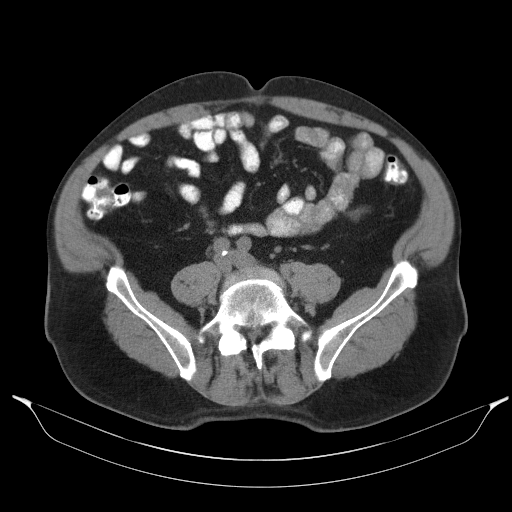
[im 50/96  soft-tissue]
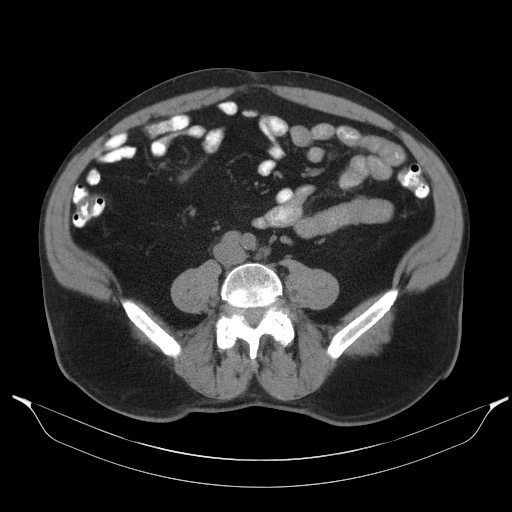
[im 59/96  soft-tissue]
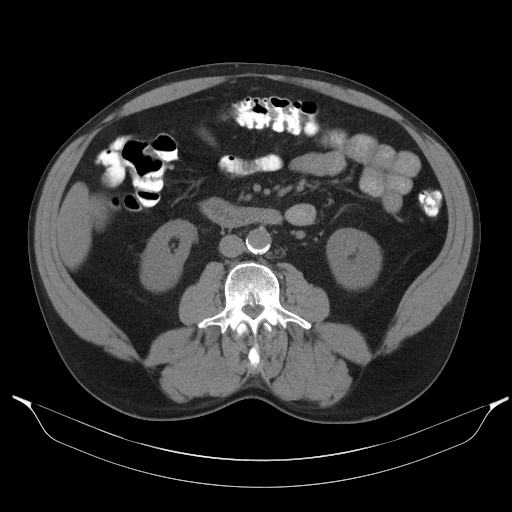
[im 68/96  soft-tissue]
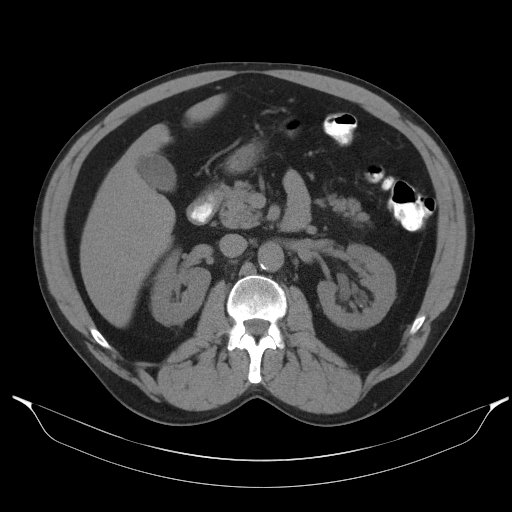
[im 68/96  bone]
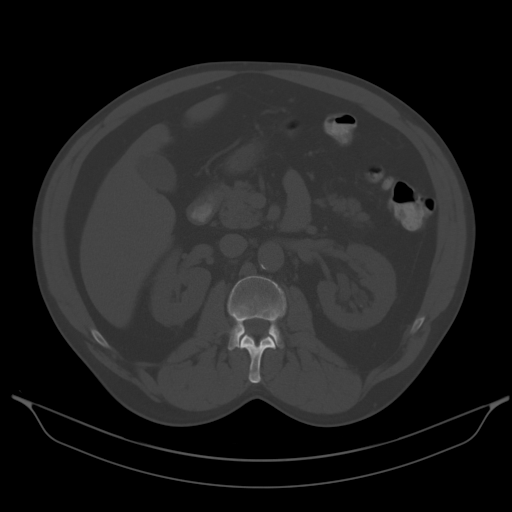
[im 73/96  soft-tissue]
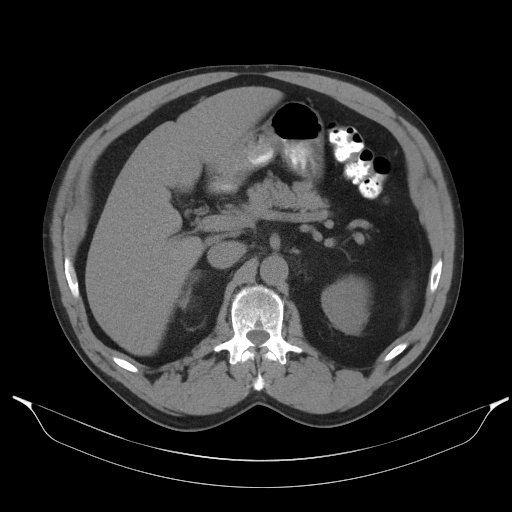
[im 77/96  lung]
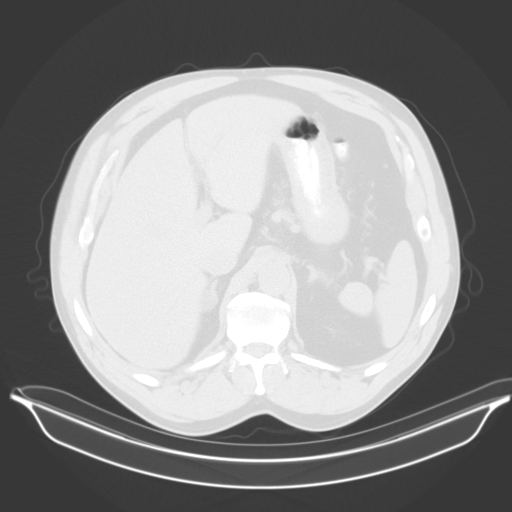
[im 82/96  soft-tissue]
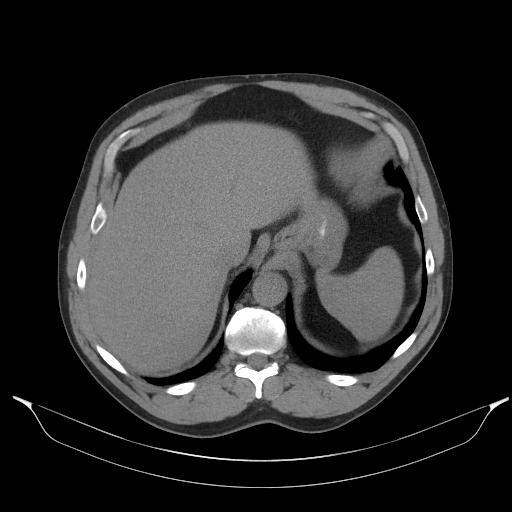
[im 82/96  lung]
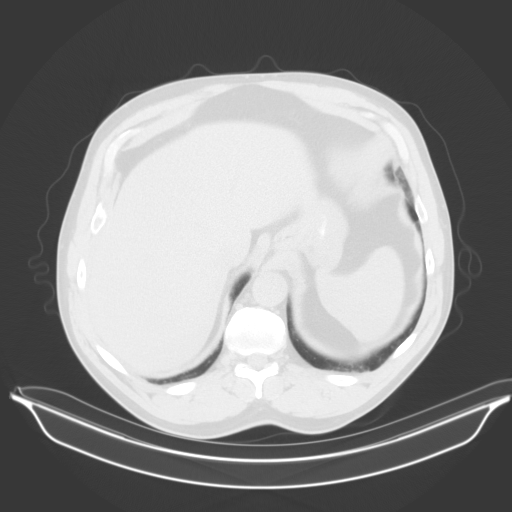
[im 86/96  lung]
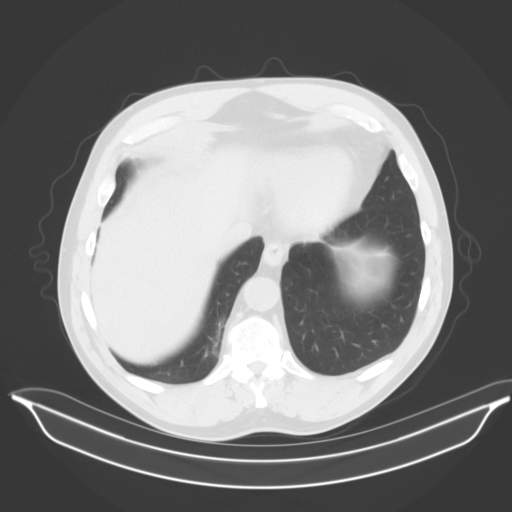
[im 91/96  soft-tissue]
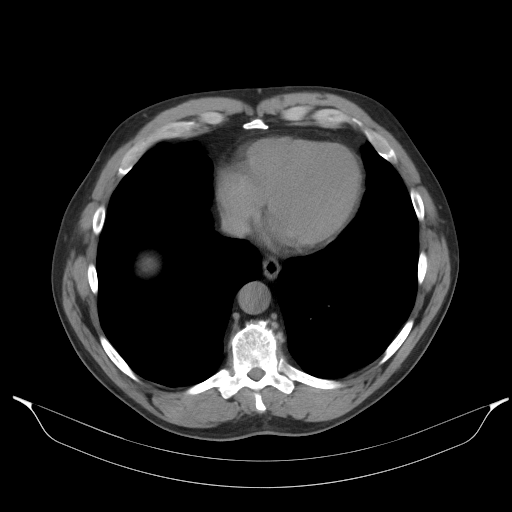
[im 91/96  lung]
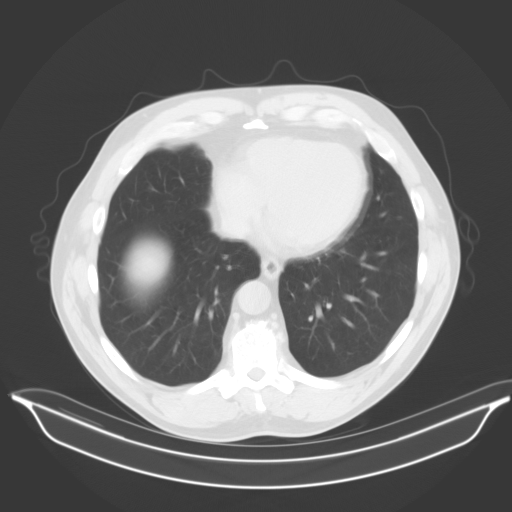

[14 of 32 positions shown; findings below may reference images not displayed]

FINDINGS: Lower chest: The lung bases are clear. The heart size is normal.

Hepatobiliary: The liver is normal. Normal gallbladder.There is no
biliary ductal dilation.

Pancreas: Normal contours without ductal dilatation. No
peripancreatic fluid collection.

Spleen: Unremarkable.

Adrenals/Urinary Tract:

--Adrenal glands: There is a benign right-sided adrenal adenoma
measuring 2.5 cm.

--Right kidney/ureter: There is a questionable punctate
nonobstructing stone in the interpolar region of the right kidney
(axial series 2, image 30). There is no right-sided hydronephrosis.

--Left kidney/ureter: There are multiple peripelvic cysts mimicking
left-sided hydronephrosis. There are multiple nonobstructing stones
measuring up to approximately 3 mm. There is no left-sided
hydronephrosis.

--Urinary bladder: The bladder is decompressed which limits
evaluation.

Stomach/Bowel:

--Stomach/Duodenum: No hiatal hernia or other gastric abnormality.
Normal duodenal course and caliber.

--Small bowel: Unremarkable.

--Colon: Unremarkable.

--Appendix: Normal.

Vascular/Lymphatic: Atherosclerotic calcification is present within
the non-aneurysmal abdominal aorta, without hemodynamically
significant stenosis.

--No retroperitoneal lymphadenopathy.

--No mesenteric lymphadenopathy.

--No pelvic or inguinal lymphadenopathy.

Reproductive: There is a penile prosthesis. There is a reservoir in
the left pelvis abutting the urinary bladder. This likely represents
the finding seen on the patient's recent ultrasound.

Other: No ascites or free air. There are bilateral fat containing
inguinal hernias.

Musculoskeletal. There are degenerative changes of the lumbar spine.
There is a degenerative 6-7 mm anterolisthesis of L4 on L5.
IMPRESSION: 1. There is no hydronephrosis. There are multiple left-sided
peripelvic cysts that mimic hydronephrosis.
2. Bilateral nonobstructing nephroliths are noted.
3. The finding seen in the patient's pelvis on the patient's recent
ultrasound likely represents the penile prosthesis reservoir. There
is no concerning finding within the patient's pelvis.
4. There is a benign right adrenal adenoma, corresponding to the
finding seen on the patient's recent ultrasound
5. There are bilateral fat containing inguinal hernias.
6. The findings on today's study argue against the diagnosis of
hepatic steatosis
7.  Aortic Atherosclerosis ([id number]).

## 2022-05-06 ENCOUNTER — Telehealth: Payer: Self-pay | Admitting: Cardiology

## 2022-05-06 NOTE — Telephone Encounter (Signed)
Pt c/o swelling: STAT is pt has developed SOB within 24 hours  How much weight have you gained and in what time span? No weight gain   If swelling, where is the swelling located? Below knee on down in both legs   Are you currently taking a fluid pill? No   Are you currently SOB? Has had a couple of "bouts" with SOB that started a month ago. 2 episodes per wife  Do you have a log of your daily weights (if so, list)? N/A   Have you gained 3 pounds in a day or 5 pounds in a week? No   Have you traveled recently? No.    Patient's wife believes this may be due to change in medication. Patient was taken off of Lisinopril and put on Hydralazine at last visit.  Please advise.

## 2022-05-06 NOTE — Telephone Encounter (Signed)
Call to patient and spoke with wife as he is not home/available. She states he has had 2 episodes of SOB with non exertion a month ago. She states he was hospitalized in November for SOB but this is different and not that bad. States episodes lasting 10 minutes and resolves with rest. She states he has swelling to bilateral LLE starting yesterday.  Non pitting, nor hard just swelling.  She gave him compression stockings to wear to bed last night and "states swelling is still there". He was to see a pulmonologist but states no one ever called, so he has not seen anyone as yet.  He is currently doing daily activities with no issues.  Concerned that the change of medication has caused the swelling. Only BP reading is 131/83  His Lisinopril was discontinued.  He currently takes amlodipine '10mg'$  daily and Hydralazine '50mg'$  TID. She is concerned about waiting until his July appt.  Noted until recommendations are returned, for patient to elevate legs (above heart if possible) several times during the day. Reduce salt intake.  Only wear low compression stockings during the day and remove at night.  Not to use her (wife) compression as unsure if these are prescription compression.  She states understanding and waits for recommendations.

## 2022-05-06 NOTE — Telephone Encounter (Signed)
Spoke with pt wife, Aware of dr Jacalyn Lefevre recommendations.  Follow up scheduled

## 2022-05-07 NOTE — Progress Notes (Signed)
Cardiology Clinic Note   Patient Name: Brandon Joyce Date of Encounter: 05/09/2022  Primary Care Provider:  Ginnie Smart, MD Primary Cardiologist:  Dr. Jens Som  Patient Profile    72 year old male with history of hypertension, ascending thoracic aortic aneurysm (4.2 cm), adrenal nodule, chronic dyspnea on exertion,asthma, hyperlipidemia, chronic kidney disease, and HIV.  Intolerant of beta-blockers due to bradycardia.  Past Medical History    Past Medical History:  Diagnosis Date   Alcoholism (HCC)    Chronic kidney disease    followed by nephro   ED (erectile dysfunction)    HIV disease (HCC)    HLD (hyperlipidemia)    HTN (hypertension)    Pneumonia    Strain of neck muscle 03/23/2017   Past Surgical History:  Procedure Laterality Date   BACK SURGERY     INCISION AND DRAINAGE ABSCESS N/A 12/07/2019   Procedure: INCISION AND DRAINAGE BUTTOCK ABSCESS;  Surgeon: Griselda Miner, MD;  Location: [facility];  Service: General;  Laterality: N/A;   LEFT HEART CATHETERIZATION WITH CORONARY ANGIOGRAM N/A 07/28/2013   Procedure: LEFT HEART CATHETERIZATION WITH CORONARY ANGIOGRAM;  Surgeon: Corky Crafts, MD;  Location: [facility] CATH LAB;  Service: Cardiovascular;  Laterality: N/A;   MASS EXCISION Left 01/25/2020   Procedure: 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;  Surgeon: Griselda Miner, MD;  Location: [facility];  Service: General;  Laterality: Left;   ROTATOR CUFF REPAIR Right 04-27-2015   TONSILLECTOMY      Allergies  Allergies  Allergen Reactions   Other Rash and Other (See Comments)    Other reaction(s): Other (See Comments), Rash Childhood allergy Childhood allergy Causes "headaches"   Sulfa Antibiotics Rash and Other (See Comments)    Childhood allergy Childhood allergy Other reaction(s): Unknown Other reaction(s): Other (See Comments) Childhood allergy Childhood allergy  Other reaction(s): Other (See Comments) Childhood  allergy Other reaction(s): Rash Childhood allergy Other reaction(s): Other (See Comments) Childhood allergy Childhood allergy Other reaction(s): Other (See Comments) Childhood allergy Other reaction(s): Other (See Comments) Childhood allergy   Sulfacetamide Sodium Rash    Other reaction(s): Other (See Comments) Childhood allergy   Sulfasalazine Rash    Other reaction(s): Other (See Comments) Childhood allergy   Atenolol Other (See Comments)    "dropped blood pressure" "dropped blood pressure"   Isosorbide Other (See Comments)    Causes "headaches" Causes "headaches"   Nitrates, Organic     Causes "headaches"   Pentaerythritol Tetranitrate Other (See Comments)    Causes "headaches" Causes "headaches"    History of Present Illness    [name] returns to the office today for ongoing assessment and management of hypertension, and complaints of worsening dyspnea on exertion, although he does have baseline dyspnea, with other history to include ascending thoracic aortic aneurysm, due for repeat CT scan.  Patient called our office on 05/06/2022 with complaints of dyspnea, on return phone call Beatris Ship, RN, Dr. Ludwig Clarks nurse, did speak with the patient's wife.  She reported that the patient had 2 episodes of shortness of breath that was nonexertional 1 month prior, lasting 10 minutes resolved with rest.  He also complained of some bilateral lower extremity edema with no resolution using compression hose.  He has some concerns about amlodipine and hydralazine worsening the lower extremity edema.  They did not endorse weight gain.  He is here to discuss.  Mr. Eiland also is being seen by [facility] cardiologist who has been following him for chronic diastolic heart  failure and hypertension as well.  He was seen by [facility] status post ED visit for acute respiratory distress.  He was to be referred to pulmonologist through [facility].  Mr. [name] states that he has gained too much weight  over the last several months since he retired.  He denies adding salt to his food but admits to having some foods which are already pretty salted.  His diet is not consistently heart healthy, and he is not very active right now.  His lower extremity edema has worsened over the last several weeks, he notices it particularly in the afternoons.  Home Medications    Current Outpatient Medications  Medication Sig Dispense Refill   albuterol (VENTOLIN HFA) 108 (90 Base) MCG/ACT inhaler Inhale 2 puffs into the lungs every 6 (six) hours as needed for wheezing or shortness of breath. 8 g 2   amLODipine (NORVASC) 10 MG tablet Take 1 tablet (10 mg total) by mouth daily. 90 tablet 3   atorvastatin (LIPITOR) 20 MG tablet Take 1 tablet by mouth once daily 30 tablet 0   BIKTARVY 50-200-25 MG TABS tablet Take 1 tablet by mouth daily. 90 tablet 5   budesonide-formoterol (SYMBICORT) 160-4.5 MCG/ACT inhaler Inhale 2 puffs into the lungs 2 (two) times daily. 1 each 12   fluticasone (FLONASE) 50 MCG/ACT nasal spray Place 1 spray into both nostrils daily. 18.2 mL 2   hydrALAZINE (APRESOLINE) 50 MG tablet Take 1 tablet (50 mg total) by mouth 3 (three) times daily. 270 tablet 3   spironolactone (ALDACTONE) 25 MG tablet Take 0.5 tablets (12.5 mg total) by mouth daily. 90 tablet 3   No current facility-administered medications for this visit.     Family History    Family History  Problem Relation Age of Onset   Diverticulitis Mother    Heart disease Mother    Atrial fibrillation Mother    Heart disease Father        CABG   Colon cancer Brother    He indicated that his mother is deceased. He indicated that his father is deceased. He indicated that his brother is alive. He indicated that his maternal grandmother is deceased. He indicated that his maternal grandfather is deceased. He indicated that his paternal grandmother is deceased. He indicated that his paternal grandfather is deceased.  Social History     Social History   Socioeconomic History   Marital status: Married    Spouse name: Not on file   Number of children: 3   Years of education: Not on file   Highest education level: GED or equivalent  Occupational History    Employer: [employer]  Tobacco Use   Smoking status: Never   Smokeless tobacco: Never  Vaping Use   Vaping Use: Never used  Substance and Sexual Activity   Alcohol use: Yes    Alcohol/week: 21.0 standard drinks of alcohol    Types: 21 Standard drinks or equivalent per week    Comment: beer and whiskey - has increased   Drug use: No   Sexual activity: Not Currently    Partners: Female    Birth control/protection: Condom    Comment: accepted condoms  Other Topics Concern   Not on file  Social History Narrative   Not on file   Social Determinants of Health   Financial Resource Strain: Not on file  Food Insecurity: No Food Insecurity (03/04/2022)   Hunger Vital Sign    Worried About Running Out of Food in the Last  Year: Never true    [place] in the Last Year: Never true  Transportation Needs: No Transportation Needs (03/04/2022)   PRAPARE - Hydrologist (Medical): No    Lack of Transportation (Non-Medical): No  Physical Activity: Not on file  Stress: Not on file  Social Connections: Moderately Isolated (03/04/2022)   Social Connection and Isolation Panel [NHANES]    Frequency of Communication with Friends and Family: Twice a week    Frequency of Social Gatherings with Friends and Family: Twice a week    Attends Religious Services: Never    Marine scientist or Organizations: No    Attends Archivist Meetings: Never    Marital Status: Married  Human resources officer Violence: Not At Risk (03/04/2022)   Humiliation, Afraid, Rape, and Kick questionnaire    Fear of Current or Ex-Partner: No    Emotionally Abused: No    Physically Abused: No    Sexually Abused: No     Review of Systems    General:  No  chills, fever, night sweats, complaints of weight gain since retiring..  Cardiovascular:  No chest pain, dyspnea on exertion, positive for LEE worsening,no  orthopnea, palpitations, paroxysmal nocturnal dyspnea. Dermatological: No rash, lesions/masses Respiratory: No cough, dyspnea Urologic: No hematuria, dysuria Abdominal:   No nausea, vomiting, diarrhea, bright red blood per rectum, melena, or hematemesis Neurologic:  No visual changes, wkns, changes in mental status. All other systems reviewed and are otherwise negative except as noted above.     Physical Exam    VS:  BP 130/70   Pulse 78   Ht 5\' 8"  (1.727 m)   Wt 222 lb 12.8 oz (101.1 kg)   SpO2 95%   BMI 33.88 kg/m  , BMI Body mass index is 33.88 kg/m.     GEN: Well nourished, well developed, in no acute distress. HEENT: normal. Neck: Supple, no JVD, carotid bruits, or masses. Cardiac: RRR, soft systolic murmurs, rubs, or gallops. No clubbing, cyanosis, 2+ to 1+ pretibial and ankle edema.,  Bilaterally.  Radials/DP/PT 2+ and equal bilaterally.  Respiratory:  Respirations regular and unlabored, clear to auscultation bilaterally. GI: Soft, nontender, nondistended, BS + x 4.  Obese MS: no deformity or atrophy. Skin: warm and dry, no rash. Neuro:  Strength and sensation are intact. Psych: Normal affect.  Brief tearfulness when discussing inability to be seen by physicians or by referrals  Accessory Clinical Findings  ECG personally reviewed by me today-not completed this office visit  Lab Results  Component Value Date   WBC 6.3 03/04/2022   WBC 5.9 03/04/2022   HGB 15.5 03/04/2022   HGB 15.7 03/04/2022   HCT 45.6 03/04/2022   HCT 47.5 03/04/2022   MCV 88 03/04/2022   MCV 91 03/04/2022   PLT 226 03/04/2022   PLT 215 03/04/2022   Lab Results  Component Value Date   CREATININE 1.29 (H) 03/04/2022   BUN 9 03/04/2022   NA 140 03/04/2022   K 4.1 03/04/2022   CL 104 03/04/2022   CO2 20 03/04/2022   Lab Results   Component Value Date   ALT 35 03/04/2022   AST 22 03/04/2022   ALKPHOS 64 03/04/2022   BILITOT 0.4 03/04/2022   Lab Results  Component Value Date   CHOL 164 03/04/2022   HDL 37 (L) 03/04/2022   LDLCALC 76 03/04/2022   TRIG 315 (H) 03/04/2022   CHOLHDL 4.4 03/04/2022    Lab Results  Component Value Date   HGBA1C 5.9 (H) 11/11/2018    Review of Prior Studies: CT of the Chest 01/15/2022 IMPRESSION:  1. No acute findings.  2.  2.6 cm indeterminate right adrenal gland nodule. Nonemergent MRI can be performed for further evaluation.  3.  Ascending thoracic aortic aneurysm measuring 4.2 cm in AP dimension, unchanged.   Nuclear medicine stress test 07/06/2013  Normal stress nuclear study. Suspect "false positive" LVH-related ECG changes, but cannot fully exclude the possibility of "balanced ischemia".   Renal Artery Ultrasound 10/27/2019 Mild-to-moderate LEFT-sided hydroureteronephrosis. 2. Complex appearing cystic area with internal debris or calcification in the RIGHT pelvis lateral to the urinary bladder could reflect changes of prior inguinal herniorrhaphy or remote postoperative changes. No surgical history matching this is found in the chart and the findings are atypical based on the size for hernia repair. Consider CT of the abdomen and pelvis for further assessment particularly if there are pelvic symptoms as a complex collection/abscess or post infectious debris containing collection or cystic neoplasm could potentially have this appearance. 3. RIGHT adrenal nodule is suspected just above the RIGHT kidney. Dedicated adrenal protocol may be helpful for further characterization. A nodule with seen in this area on a previous study, images not available for review and density values likely not diagnostic for adenoma at that time. 4. Hepatic steatosis.  Echocardiogram 01/13/2022 Impression  Left Ventricle: Systolic function is normal. EF: 65-70%.   Left Ventricle: There  is mild concentric hypertrophy.   Left Ventricle: There is probable stage II diastolic dysfunction.   Aortic Valve: The aortic valve is tricuspid. The leaflets are mildly thickened and exhibit probably normal excursion. There is moderate sclerosis. Assessment & Plan   1.  Chronic diastolic CHF: Patient has grade 2 diastolic CHF per echocardiogram dated 01/13/2022 via [facility].  He was also found to have some mild LVH.  He is retaining some fluid, has gained some weight, and has been not completely adherent to low-sodium diet.  I will start him on low-dose spironolactone 12.5 mg daily and I have urged him to follow-up with cardiology through [facility] as he needs to be seen by one practice to avoid confusion and for consistency of care. I have explained this to him and he verbalizes understanding.   He is allergic to sulfa drugs, and therefore Lasix or HCTZ was not prescribed.  Consideration for ethacrynic acid if he does not tolerate spironolactone on follow-up labs, which may reveal worsening kidney function (creat. 1.26)  He is given a Salty 6 information sheet, advised to be more active and to keep his feet elevated.  2.  Hypertension: He is on amlodipine and hydralazine which can contribute to dependent edema.  I have advised him on keeping his legs elevated as well as watching salt intake.  3.  Chronic dyspnea: Multifactorial in the setting of sedentary lifestyle, obesity.  Referral to pulmonology is recommended for further investigation and treatment.  He prefers to stay with [facility] healthcare system.  4.  History of EtOH abuse: Reduce consumption is strongly recommended to help with weight loss and for overall health.  Current medicines are reviewed at length with the patient today.  I have spent 25 min's  dedicated to the care of this patient on the date of this encounter to include pre-visit review of records, assessment, management and diagnostic testing,with shared decision  making.  Addendum completed to correct dictation errors/  Signed, Phill Myron. West Pugh, ANP, AACC   05/09/2022 12:00 PM  Office [phone] Fax [phone]  Notice: This dictation was prepared with Dragon dictation along with smaller phrase technology. Any transcriptional errors that result from this process are unintentional and may not be corrected upon review.

## 2022-05-09 ENCOUNTER — Ambulatory Visit: Payer: Medicare HMO | Attending: Adult Health | Admitting: Adult Health

## 2022-05-09 ENCOUNTER — Encounter: Payer: Self-pay | Admitting: Adult Health

## 2022-05-09 VITALS — BP 130/70 | HR 78 | Ht 68.0 in | Wt 222.8 lb

## 2022-05-09 DIAGNOSIS — I5032 Chronic diastolic (congestive) heart failure: Secondary | ICD-10-CM

## 2022-05-09 DIAGNOSIS — I712 Thoracic aortic aneurysm, without rupture, unspecified: Secondary | ICD-10-CM

## 2022-05-09 DIAGNOSIS — I10 Essential (primary) hypertension: Secondary | ICD-10-CM | POA: Diagnosis not present

## 2022-05-09 DIAGNOSIS — F1020 Alcohol dependence, uncomplicated: Secondary | ICD-10-CM

## 2022-05-09 DIAGNOSIS — R0609 Other forms of dyspnea: Secondary | ICD-10-CM | POA: Diagnosis not present

## 2022-05-09 MED ORDER — SPIRONOLACTONE 25 MG PO TABS: 12.5000 mg | ORAL_TABLET | Freq: Every day | ORAL | 3 refills | Status: AC

## 2022-05-09 NOTE — Patient Instructions (Signed)
Medication Instructions:  Start Spironolactone 12.5 mg ( Take 1/2  of  25 mg Tablet Daily). *If you need a refill on your cardiac medications before your next appointment, please call your pharmacy*   Lab Work: No Labs If you have labs (blood work) drawn today and your tests are completely normal, you will receive your results only by: [place] (if you have MyChart) OR A paper copy in the mail If you have any lab test that is abnormal or we need to change your treatment, we will call you to review the results.   Testing/Procedures: No Testing   Follow-Up: At [facility], you and your health needs are our priority.  As part of our continuing mission to provide you with exceptional heart care, we have created designated Provider Care Teams.  These Care Teams include your primary Cardiologist (physician) and Advanced Practice Providers (APPs -  Physician Assistants and Nurse Practitioners) who all work together to provide you with the care you need, when you need it.  We recommend signing up for the patient portal called "MyChart".  Sign up information is provided on this After Visit Summary.  MyChart is used to connect with patients for Virtual Visits (Telemedicine).  Patients are able to view lab/test results, encounter notes, upcoming appointments, etc.  Non-urgent messages can be sent to your provider as well.   To learn more about what you can do with MyChart, go to [email or web address].    Your next appointment:   As Needed  Provider:

## 2022-05-27 ENCOUNTER — Encounter: Payer: Medicare HMO | Admitting: Infectious Diseases

## 2022-06-04 ENCOUNTER — Encounter: Payer: Self-pay | Admitting: Infectious Diseases

## 2022-06-04 ENCOUNTER — Other Ambulatory Visit: Payer: Self-pay

## 2022-06-04 ENCOUNTER — Ambulatory Visit (INDEPENDENT_AMBULATORY_CARE_PROVIDER_SITE_OTHER): Payer: Medicare HMO | Admitting: Infectious Diseases

## 2022-06-04 VITALS — BP 129/76 | HR 74 | Temp 98.1°F | Ht 68.0 in | Wt 221.8 lb

## 2022-06-04 DIAGNOSIS — J4521 Mild intermittent asthma with (acute) exacerbation: Secondary | ICD-10-CM | POA: Diagnosis not present

## 2022-06-04 DIAGNOSIS — I129 Hypertensive chronic kidney disease with stage 1 through stage 4 chronic kidney disease, or unspecified chronic kidney disease: Secondary | ICD-10-CM | POA: Diagnosis not present

## 2022-06-04 DIAGNOSIS — B20 Human immunodeficiency virus [HIV] disease: Secondary | ICD-10-CM

## 2022-06-04 DIAGNOSIS — N1831 Chronic kidney disease, stage 3a: Secondary | ICD-10-CM

## 2022-06-04 DIAGNOSIS — I10 Essential (primary) hypertension: Secondary | ICD-10-CM | POA: Diagnosis not present

## 2022-06-04 MED ORDER — BREO ELLIPTA 50-25 MCG/INH IN AEPB: 1.0000 | INHALATION_SPRAY | Freq: Every day | RESPIRATORY_TRACT | 3 refills | Status: DC

## 2022-06-04 MED ORDER — FLUTICASONE PROPIONATE 50 MCG/ACT NA SUSP: 1.0000 | Freq: Every day | NASAL | 2 refills | Status: DC

## 2022-06-04 NOTE — Assessment & Plan Note (Signed)
He appears to be doing well Will see him back in 3 months.

## 2022-06-04 NOTE — Assessment & Plan Note (Signed)
Well controlled today on his norvasc, spironolactone

## 2022-06-04 NOTE — Progress Notes (Signed)
Subjective:    Patient ID: Brandon Joyce, male  DOB: [birth year], 72 y.o.        MRN: [id number]   HPI 72 yo M with HIV+, statin related myalgias, hyperlipidemia, erectile dysfunction, hypertension, ETOH use.  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 He has thoracic aortic aneurysm as well, repeat CTA 01-2022.  Had another work related injury fall 2018, C6 pinched nerve.  He had a nerve block which helped for ~ 48h. He had c-spine surgery 10-23-17.  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    Got penoplasty 07-2017 for ED.  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 No problems with this.  His Cr has increased some (1.25 last).    Wife is WC bound, prev bed-sore resolved. Has home health nurse helping. Never smoker, wife smokes. She is becoming less mobile (feels like she is a burden).  Wife had lipoma removed from her neck last week.  Has concerns about his parents- Mother with continence issues. She fell and broke her nose this week.  Has help from his sister. Dad deceased Sep 28, 2021.     Was in hospital in [facility] 12-2021 for 2 nights, for asthma, would now like Pulm eval. He was given steroid taper as well as albuterol inh. He had CT chest (-).  Had ? Asthma attack. His RVP was (-). TTE 65-70% Grade 2 diastolic dysfunction.    Also followed by CV last month.  He had spironolactone added due to worsened SOB LE edema. He has been urinating more. Has lost 7# since. He also started wearing support hose.  He is allergic to sulfa so lasix/hctz avoided.  He was to f/u with pulm and CV at [facility].  He has not gotten appt with Pulm at [facility]. Would f/u at pulm [place] if referred.  He was not able to afford symbicort, does have albuterol (using 3-4x/day).   Has diet plan and will start on 06-09-22.    CD4 726 (02-2022).  HIV 1 RNA Quant  Date Value  05/30/2021 NOT DETECTED copies/mL  03/08/2021 Not Detected Copies/mL  05/21/2020 Not Detected Copies/mL   HIV-1 RNA Viral  Load (copies/mL)  Date Value  03/04/2022 <20   CD4 T Cell Abs (/uL)  Date Value  05/30/2021 612  05/21/2020 627  08/25/2019 530     Health Maintenance  Topic Date Due  . Medicare Annual Wellness (AWV)  Never done  . COVID-19 Vaccine (1) Never done  . Zoster Vaccines- Shingrix (1 of 2) Never done  . COLONOSCOPY (Pts 45-62yrs Insurance coverage will need to be confirmed)  02/27/2019  . INFLUENZA VACCINE  09/25/2022  . DTaP/Tdap/Td (2 - Td or Tdap) 12/30/2026  . Pneumonia Vaccine 87+ Years old  Completed  . Hepatitis C Screening  Completed  . HPV VACCINES  Aged Out      Review of Systems  Constitutional:  Negative for weight loss.  Respiratory:  Positive for shortness of breath.   Cardiovascular:  Positive for leg swelling. Negative for chest pain.  Gastrointestinal:  Negative for constipation and diarrhea.  Genitourinary:  Negative for dysuria.    Please see HPI. All other systems reviewed and negative.     Objective:  Physical Exam Vitals reviewed.  Constitutional:      Appearance: Normal appearance. He is obese.  HENT:     Mouth/Throat:     Mouth: Mucous membranes are moist.     Pharynx: No oropharyngeal exudate.  Eyes:  Extraocular Movements: Extraocular movements intact.     Pupils: Pupils are equal, round, and reactive to light.  Cardiovascular:     Rate and Rhythm: Normal rate and regular rhythm.  Pulmonary:     Effort: Pulmonary effort is normal. No tachypnea or bradypnea.     Breath sounds: Decreased air movement present.  Abdominal:     General: Bowel sounds are normal. There is no distension.     Palpations: Abdomen is soft.     Tenderness: There is no abdominal tenderness.  Musculoskeletal:        General: Normal range of motion.     Cervical back: Normal range of motion and neck supple.     Right lower leg: No edema.     Left lower leg: No edema.  Neurological:     General: No focal deficit present.     Mental Status: He is alert.   Psychiatric:        Mood and Affect: Mood normal.          Assessment & Plan:

## 2022-06-04 NOTE — Assessment & Plan Note (Signed)
Will work on getting him back into pulm clinic.  Have re-written steroid-laba inhaler to see if his insurance will cover.

## 2022-06-04 NOTE — Assessment & Plan Note (Signed)
will continue to follow his Cr- last 1.29 Repeat after starting spironolactone.

## 2022-06-05 LAB — BASIC METABOLIC PANEL
BUN/Creatinine Ratio: 11 (ref 10–24)
BUN: 12 mg/dL (ref 8–27)
CO2: 19 mmol/L — ABNORMAL LOW (ref 20–29)
Calcium: 9 mg/dL (ref 8.6–10.2)
Chloride: 104 mmol/L (ref 96–106)
Creatinine, Ser: 1.11 mg/dL (ref 0.76–1.27)
Glucose: 93 mg/dL (ref 70–99)
Potassium: 4.4 mmol/L (ref 3.5–5.2)
Sodium: 139 mmol/L (ref 134–144)
eGFR: 71 mL/min/{1.73_m2} (ref >59)

## 2022-06-09 ENCOUNTER — Telehealth: Payer: Self-pay

## 2022-06-09 ENCOUNTER — Telehealth: Payer: Medicare HMO

## 2022-06-09 ENCOUNTER — Telehealth: Payer: Self-pay | Admitting: *Deleted

## 2022-06-09 ENCOUNTER — Encounter: Payer: Self-pay | Admitting: *Deleted

## 2022-06-09 NOTE — Progress Notes (Signed)
  Care Coordination  Note  06/09/2022 Name: Valerie Exposito MRN: [id number] DOB: October 15, 1950  Ubaid Coulibaly is a 72 y.o. year old male who is a primary care patient of Pcp, No. I reached out to [name] by phone today to offer care coordination services.      Mr. Krook was given information about Care Coordination services today including:  The Care Coordination services include support from the care team which includes your Nurse Coordinator, Clinical Social Worker, or Pharmacist.  The Care Coordination team is here to help remove barriers to the health concerns and goals most important to you. Care Coordination services are voluntary and the patient may decline or stop services at any time by request to their care team member.   Patient agreed to services and verbal consent obtained.   Follow up plan: Telephone appointment with care coordination team member scheduled for:06/09/2022  Penne Lash, RMA Care Guide [facility]  [place], [place] [address] Direct Dial: [phone] [email or web address]

## 2022-06-09 NOTE — Progress Notes (Signed)
error 

## 2022-06-09 NOTE — Telephone Encounter (Signed)
  Care Management   Visit Note  06/09/2022 Name: Brandon Joyce MRN: [id number] DOB: 1950/04/22  Subjective: Brandon Joyce is a 72 y.o. year old male who is a primary care patient of Pcp, No. The Care Management team was consulted for assistance.      Engaged with patient Engaged with patient by telephone for Initial outreach.  Assessment:   Interventions: Evaluation of current treatment plan related to HTN, Asthma, and CHF and patient's adherence to plan as established by provider. Advised patient to check blood pressure at least 3 x per week and record (pt has a cuff), weigh daily (pt has a scale), reviewed weighing daily in morning on hard surface after emptying bladder and before getting dressed. Provided education to patient re: HTN, Asthma, CHF- low sodium diet, Asthma action plan and HF action plan Reviewed medications with patient and discussed importance of taking as prescribed, pt agreeable to pharmacy outreach for medication assistnce Advised patient, providing education and rationale, to monitor blood pressure daily and record, calling Dr. Ninetta Lights for findings outside established parameters.  Reviewed scheduled/upcoming provider appointments including:  Advised patient, providing education and rationale, to weigh daily and record, calling cardiologist at [facility] for weight gain of 3lbs overnight or 5 pounds in a week.  Pharmacy referral for patient unable to afford Breo Ellipta inhaler Discussed plans with patient for ongoing care management follow up and provided patient with direct contact information for care management team  Recommendations:   Patient will work with pharmacist for medication assistance, unable to afford Breo inhaler Talked about alcohol intake and ramifications this may lead to for decreased quality of life and adverse health, reviewed importance of cessation, pt not ready at present. Patient needs reinforcement of HF and Asthma action plan.  Consent to Services:   Patient was given information about care management services, agreed to services, and gave verbal consent.   Plan: Telephone follow up appointment with care management team member scheduled for:  07/14/22 at 3 pm  [name] [facility], BSN RN Case Manager [facility] Internal Medicine [phone]

## 2022-06-10 ENCOUNTER — Other Ambulatory Visit: Payer: Medicare HMO | Admitting: Pharmacist

## 2022-06-10 NOTE — Progress Notes (Signed)
06/10/2022 Name: Brandon Joyce MRN: [id number] DOB: Jan 29, 1951  No chief complaint on file.   Brandon Joyce is a 72 y.o. year old male who presented for a telephone visit.   They were referred to the pharmacist by their Case Management Team  for assistance in managing medication access.    Subjective:  Care Team: Primary Care Provider: Pcp, No ; Next Scheduled Visit: 07/14/22 Pulmonology (Dr. Vassie Loll): 07/03/22  Medication Access/Adherence  Current Pharmacy:  [facility] - [place], [place] - [address] [place] [address] Phone: [phone] Fax: [phone]  [facility] [place], [place] - [address] [facility] [address] [place] [address] Phone: [phone] Fax: [phone]   Patient reports affordability concerns with their medications: Yes $34 for breo ellipta and cannot afford Patient reports access/transportation concerns to their pharmacy: No  Patient reports adherence concerns with their medications:  No  though questionable health literacy   Asthma:  Current medications:   albuterol PRN, breo ellipta (prescribed though cannot afford)  Reports 1-2 exacerbations in the past year  Current medication access support: pending   Objective:  Lab Results  Component Value Date   HGBA1C 5.9 (H) 11/11/2018    Lab Results  Component Value Date   CREATININE 1.11 06/04/2022   BUN 12 06/04/2022   NA 139 06/04/2022   K 4.4 06/04/2022   CL 104 06/04/2022   CO2 19 (L) 06/04/2022    Lab Results  Component Value Date   CHOL 164 03/04/2022   HDL 37 (L) 03/04/2022   LDLCALC 76 03/04/2022   TRIG 315 (H) 03/04/2022   CHOLHDL 4.4 03/04/2022    Medications Reviewed Today     Reviewed by Audrie Gallus, RN (Registered Nurse) on 06/09/22 at 1450  Med List Status: <None>   Medication Order Taking? Sig Documenting Provider Last Dose Status Informant  albuterol (VENTOLIN HFA) 108 (90 Base) MCG/ACT inhaler  [id number] Yes Inhale 2 puffs into the lungs every 6 (six) hours as needed for wheezing or shortness of breath. Ginnie Smart, MD Taking Active   amLODipine (NORVASC) 10 MG tablet [id number] Yes Take 1 tablet (10 mg total) by mouth daily. Ginnie Smart, MD Taking Active   atorvastatin (LIPITOR) 20 MG tablet [id number] Yes Take 1 tablet by mouth once daily Lewayne Bunting, MD Taking Active   BIKTARVY 50-200-25 MG TABS tablet [id number] Yes Take 1 tablet by mouth daily. Ginnie Smart, MD Taking Active   fluticasone [facility]) 50 MCG/ACT nasal spray [id number] Yes Place 1 spray into both nostrils daily. Ginnie Smart, MD Taking Active   Fluticasone Furoate-Vilanterol (BREO ELLIPTA) 50-25 MCG/ACT AEPB [id number] Yes Inhale 1 Inhalation into the lungs daily. Ginnie Smart, MD Taking Active   hydrALAZINE (APRESOLINE) 50 MG tablet [id number] Yes Take 1 tablet (50 mg total) by mouth 3 (three) times daily. Lewayne Bunting, MD Taking Active   spironolactone (ALDACTONE) 25 MG tablet [id number] Yes Take 0.5 tablets (12.5 mg total) by mouth daily. Jodelle Gross, NP Taking Active               Assessment/Plan:   Asthma: - Currently uncontrolled, related to medication access/cost burden - Reviewed appropriate inhaler technique. - Recommend to continue current regimen and keep upcoming appt for re-establish with pulmonology  - Meets financial criteria for breo ellipta patient assistance program through [employer]. Will collaborate with provider, CPhT, and patient to pursue assistance.    Follow Up  Plan: 1 month  Lynnda Shields, PharmD, BCPS Clinical Pharmacist [facility] Primary Care

## 2022-06-12 ENCOUNTER — Other Ambulatory Visit (HOSPITAL_COMMUNITY): Payer: Self-pay

## 2022-06-16 ENCOUNTER — Other Ambulatory Visit: Payer: Medicare HMO | Admitting: Pharmacist

## 2022-06-16 NOTE — Progress Notes (Unsigned)
06/16/2022 Name: Brandon Joyce MRN: [id number] DOB: [birth year]  Chief Complaint  Patient presents with   Medication Assistance    Brandon Joyce is a 72 y.o. year old male who presented for a telephone visit.   They were referred to the pharmacist by their Case Management Team  for assistance in managing medication access.    Subjective:  Care Team: Primary Care Provider: Pcp, No ; Next Scheduled Visit: 07/14/22 Pulmonology (Dr. Vassie Loll): 07/03/22  Medication Access/Adherence  Current Pharmacy:  [facility] - [place], [place] - [address] [place] [address] Phone: [phone] Fax: [phone]  [facility] - [place] - [address] [facility] [address] [place] [address] Phone: [phone] Fax: [phone]   Patient reports affordability concerns with their medications: Yes $34 for breo ellipta and cannot afford Patient reports access/transportation concerns to their pharmacy: No  Patient reports adherence concerns with their medications:  No  though questionable health literacy   Asthma:  Current medications:   albuterol PRN, breo ellipta (prescribed though cannot afford)  Reports 1-2 exacerbations in the past year  Current medication access support: pending- attempted [employer] patient assistance for breo ellipta, patient has not met the $600 out of pocket spend requirement, therefore alternative needed   Objective:  Lab Results  Component Value Date   HGBA1C 5.9 (H) 11/11/2018    Lab Results  Component Value Date   CREATININE 1.11 06/04/2022   BUN 12 06/04/2022   NA 139 06/04/2022   K 4.4 06/04/2022   CL 104 06/04/2022   CO2 19 (L) 06/04/2022    Lab Results  Component Value Date   CHOL 164 03/04/2022   HDL 37 (L) 03/04/2022   LDLCALC 76 03/04/2022   TRIG 315 (H) 03/04/2022   CHOLHDL 4.4 03/04/2022    Medications Reviewed Today     Reviewed by Audrie Gallus, RN (Registered Nurse) on  06/09/22 at 1450  Med List Status: <None>   Medication Order Taking? Sig Documenting Provider Last Dose Status Informant  albuterol (VENTOLIN HFA) 108 (90 Base) MCG/ACT inhaler [id number] Yes Inhale 2 puffs into the lungs every 6 (six) hours as needed for wheezing or shortness of breath. Ginnie Smart, MD Taking Active   amLODipine (NORVASC) 10 MG tablet [id number] Yes Take 1 tablet (10 mg total) by mouth daily. Ginnie Smart, MD Taking Active   atorvastatin (LIPITOR) 20 MG tablet [id number] Yes Take 1 tablet by mouth once daily Lewayne Bunting, MD Taking Active   BIKTARVY 50-200-25 MG TABS tablet [id number] Yes Take 1 tablet by mouth daily. Ginnie Smart, MD Taking Active   fluticasone [facility]) 50 MCG/ACT nasal spray [id number] Yes Place 1 spray into both nostrils daily. Ginnie Smart, MD Taking Active   Fluticasone Furoate-Vilanterol (BREO ELLIPTA) 50-25 MCG/ACT AEPB [id number] Yes Inhale 1 Inhalation into the lungs daily. Ginnie Smart, MD Taking Active   hydrALAZINE (APRESOLINE) 50 MG tablet [id number] Yes Take 1 tablet (50 mg total) by mouth 3 (three) times daily. Lewayne Bunting, MD Taking Active   spironolactone (ALDACTONE) 25 MG tablet [id number] Yes Take 0.5 tablets (12.5 mg total) by mouth daily. Jodelle Gross, NP Taking Active               Assessment/Plan:   Asthma: - Currently uncontrolled, related to medication access/cost burden - Attempted patient assistance for breo ellipta inhaler, patient confirms has not yet met the $600 out of pocket spend  requirment for the [employer] manufacturer patient assistance program.  - Will facilitate with PCP, consider wixela 100-65mcg BID as prescription alternative to breo ellipta, until pt establishes with pulmonology next month - Attempted to call patients dispensing pharmacy to verbal RX/test claim pricing, placed on hold and then pharmacy hung up the line/call drop.   Follow Up Plan: 1 month  Lynnda Shields,  PharmD, BCPS Clinical Pharmacist [facility] Primary Care

## 2022-06-18 MED ORDER — FLUTICASONE-SALMETEROL 100-50 MCG/ACT IN AEPB: 1.0000 | INHALATION_SPRAY | Freq: Two times a day (BID) | RESPIRATORY_TRACT | 2 refills | Status: AC

## 2022-07-03 ENCOUNTER — Institutional Professional Consult (permissible substitution) (HOSPITAL_BASED_OUTPATIENT_CLINIC_OR_DEPARTMENT_OTHER): Payer: Medicare HMO | Admitting: Pulmonary Disease

## 2022-07-03 ENCOUNTER — Telehealth: Payer: Self-pay

## 2022-07-03 NOTE — Telephone Encounter (Signed)
Contacted [name] to schedule their annual wellness visit. Appointment made for 07/23/2022.  [facility] Care Guide [facility] AWV TEAM Direct Dial: [phone]

## 2022-07-05 DIAGNOSIS — I5023 Acute on chronic systolic (congestive) heart failure: Secondary | ICD-10-CM | POA: Diagnosis not present

## 2022-07-05 DIAGNOSIS — Z888 Allergy status to other drugs, medicaments and biological substances status: Secondary | ICD-10-CM | POA: Diagnosis not present

## 2022-07-05 DIAGNOSIS — R609 Edema, unspecified: Secondary | ICD-10-CM | POA: Diagnosis not present

## 2022-07-05 DIAGNOSIS — Z743 Need for continuous supervision: Secondary | ICD-10-CM | POA: Diagnosis not present

## 2022-07-05 DIAGNOSIS — R5382 Chronic fatigue, unspecified: Secondary | ICD-10-CM | POA: Diagnosis not present

## 2022-07-05 DIAGNOSIS — R0602 Shortness of breath: Secondary | ICD-10-CM | POA: Diagnosis not present

## 2022-07-05 DIAGNOSIS — E785 Hyperlipidemia, unspecified: Secondary | ICD-10-CM | POA: Diagnosis not present

## 2022-07-05 DIAGNOSIS — Z881 Allergy status to other antibiotic agents status: Secondary | ICD-10-CM | POA: Diagnosis not present

## 2022-07-05 DIAGNOSIS — Z791 Long term (current) use of non-steroidal anti-inflammatories (NSAID): Secondary | ICD-10-CM | POA: Diagnosis not present

## 2022-07-05 DIAGNOSIS — R509 Fever, unspecified: Secondary | ICD-10-CM | POA: Diagnosis not present

## 2022-07-05 DIAGNOSIS — I509 Heart failure, unspecified: Secondary | ICD-10-CM | POA: Diagnosis not present

## 2022-07-05 DIAGNOSIS — R062 Wheezing: Secondary | ICD-10-CM | POA: Diagnosis not present

## 2022-07-05 DIAGNOSIS — R0609 Other forms of dyspnea: Secondary | ICD-10-CM | POA: Diagnosis not present

## 2022-07-05 DIAGNOSIS — E7800 Pure hypercholesterolemia, unspecified: Secondary | ICD-10-CM | POA: Diagnosis not present

## 2022-07-05 DIAGNOSIS — I10 Essential (primary) hypertension: Secondary | ICD-10-CM | POA: Diagnosis not present

## 2022-07-05 DIAGNOSIS — N189 Chronic kidney disease, unspecified: Secondary | ICD-10-CM | POA: Diagnosis not present

## 2022-07-05 DIAGNOSIS — Z880 Allergy status to penicillin: Secondary | ICD-10-CM | POA: Diagnosis not present

## 2022-07-05 DIAGNOSIS — Z79899 Other long term (current) drug therapy: Secondary | ICD-10-CM | POA: Diagnosis not present

## 2022-07-05 DIAGNOSIS — R0989 Other specified symptoms and signs involving the circulatory and respiratory systems: Secondary | ICD-10-CM | POA: Diagnosis not present

## 2022-07-05 DIAGNOSIS — I110 Hypertensive heart disease with heart failure: Secondary | ICD-10-CM | POA: Diagnosis not present

## 2022-07-05 DIAGNOSIS — R9431 Abnormal electrocardiogram [ECG] [EKG]: Secondary | ICD-10-CM | POA: Diagnosis not present

## 2022-07-05 DIAGNOSIS — Z882 Allergy status to sulfonamides status: Secondary | ICD-10-CM | POA: Diagnosis not present

## 2022-07-05 DIAGNOSIS — Z87891 Personal history of nicotine dependence: Secondary | ICD-10-CM | POA: Diagnosis not present

## 2022-07-05 DIAGNOSIS — I130 Hypertensive heart and chronic kidney disease with heart failure and stage 1 through stage 4 chronic kidney disease, or unspecified chronic kidney disease: Secondary | ICD-10-CM | POA: Diagnosis not present

## 2022-07-05 DIAGNOSIS — M7989 Other specified soft tissue disorders: Secondary | ICD-10-CM | POA: Diagnosis not present

## 2022-07-05 DIAGNOSIS — Z21 Asymptomatic human immunodeficiency virus [HIV] infection status: Secondary | ICD-10-CM | POA: Diagnosis not present

## 2022-07-06 DIAGNOSIS — R9431 Abnormal electrocardiogram [ECG] [EKG]: Secondary | ICD-10-CM | POA: Diagnosis not present

## 2022-07-06 DIAGNOSIS — I5023 Acute on chronic systolic (congestive) heart failure: Secondary | ICD-10-CM | POA: Diagnosis not present

## 2022-07-06 DIAGNOSIS — I110 Hypertensive heart disease with heart failure: Secondary | ICD-10-CM | POA: Diagnosis not present

## 2022-07-08 ENCOUNTER — Other Ambulatory Visit: Payer: Medicare HMO | Admitting: Pharmacist

## 2022-07-08 NOTE — Progress Notes (Signed)
07/08/2022 Name: Brandon Joyce MRN: [id number] DOB: 05/28/1950  No chief complaint on file.   Brandon Joyce is a 72 y.o. year old male who presented for a telephone visit.   They were referred to the pharmacist by their Case Management Team  for assistance in managing medication access.  Patient recently went to ER for shortness of breath and swelling in legs - self-increased dose of spironolactone 12.5mg  daily to 25mg  daily. He ran out of this medicine, then presented to ER.    Subjective:  Care Team: Primary Care Provider: Pcp, No ; Next Scheduled Visit: 07/14/22 Pulmonology (Dr. Vassie Loll): 07/03/22  Medication Access/Adherence  Current Pharmacy:  [facility] - [place], [place] - [address] [place] [address] Phone: [phone] Fax: [phone]  [facility] - [place] - [address] [facility] [address] [place] [address] Phone: [phone] Fax: [phone]   Patient reports affordability concerns with their medications: Yes $34 for breo ellipta and cannot afford Patient reports access/transportation concerns to their pharmacy: No  Patient reports adherence concerns with their medications:  No  though questionable health literacy   Asthma:  Current medications:   albuterol PRN, breo ellipta (prescribed though cannot afford)  Reports 1-2 exacerbations in the past year  Current medication access support: pending- attempted [employer] patient assistance for breo ellipta, patient has not met the $600 out of pocket spend requirement, therefore alternative needed   Objective:  Lab Results  Component Value Date   HGBA1C 5.9 (H) 11/11/2018    Lab Results  Component Value Date   CREATININE 1.11 06/04/2022   BUN 12 06/04/2022   NA 139 06/04/2022   K 4.4 06/04/2022   CL 104 06/04/2022   CO2 19 (L) 06/04/2022    Lab Results  Component Value Date   CHOL 164 03/04/2022   HDL 37 (L) 03/04/2022   LDLCALC 76  03/04/2022   TRIG 315 (H) 03/04/2022   CHOLHDL 4.4 03/04/2022    Medications Reviewed Today     Reviewed by Gabriel Carina, RPH (Pharmacist) on 07/08/22 at 1416  Med List Status: <None>   Medication Order Taking? Sig Documenting Provider Last Dose Status Informant  albuterol (VENTOLIN HFA) 108 (90 Base) MCG/ACT inhaler [id number] Yes Inhale 2 puffs into the lungs every 6 (six) hours as needed for wheezing or shortness of breath. Brandon Smart, MD Taking Active   amLODipine (NORVASC) 10 MG tablet [id number] Yes Take 1 tablet (10 mg total) by mouth daily. Brandon Smart, MD Taking Active   atorvastatin (LIPITOR) 20 MG tablet [id number] Yes Take 1 tablet by mouth once daily Lewayne Bunting, MD Taking Active   BIKTARVY 50-200-25 MG TABS tablet [id number] Yes Take 1 tablet by mouth daily. Brandon Smart, MD Taking Active   fluticasone [facility]) 50 MCG/ACT nasal spray [id number] Yes Place 1 spray into both nostrils daily. Brandon Smart, MD Taking Active   fluticasone-salmeterol [facility]) 100-50 MCG/ACT AEPB [id number] Yes Inhale 1 puff into the lungs 2 (two) times daily. Brandon Smart, MD Taking Active   hydrALAZINE (APRESOLINE) 50 MG tablet [id number] Yes Take 1 tablet (50 mg total) by mouth 3 (three) times daily. Lewayne Bunting, MD Taking Active   spironolactone (ALDACTONE) 25 MG tablet [id number] No Take 0.5 tablets (12.5 mg total) by mouth daily.  Patient not taking: Reported on 07/08/2022   Jodelle Gross, NP Not Taking Active  Assessment/Plan:   Asthma: - Currently uncontrolled, but resolved medication access issue of cost barrier by prescribing wixela as an alternative to breo ellipta. Patient has filled at pharmacy and states this is more affordable, no copay. Provided adherence counseling to improve using inhaler consistently.  - Contacted pharmacy regarding spironolactone RX, patient was out of medication due self-increasing dose,  patient can fill next refill on 07/11/22.  - Advised patient to contact PCP office for sooner (post-ED visit) follow up, and request RX for spironolactone if he feels he needs it sooner than 3 days. Patient verbalized understanding.   Follow Up Plan: PRN, medication access barrier resolved - provided patient with contact number if needing ongoing pharmacy support in future.  Lynnda Shields, PharmD, BCPS Clinical Pharmacist [facility] Primary Care

## 2022-07-14 ENCOUNTER — Telehealth: Payer: Medicare HMO

## 2022-07-14 ENCOUNTER — Telehealth: Payer: Self-pay | Admitting: *Deleted

## 2022-07-14 NOTE — Telephone Encounter (Signed)
   CCM RN Visit Note   07/14/22 Name: Brandon Joyce MRN: [id number]      DOB: [birth year]  Subjective: Brandon Joyce is a 72 y.o. year old male who is a primary care patient of Johny Sax MD. The patient was referred to the Chronic Care Management team for assistance with care management needs subsequent to provider initiation of CCM services and plan of care.      An unsuccessful telephone outreach was attempted today to contact the patient about Chronic Care Management needs.    Plan:Telephone follow up appointment with care management team member scheduled for:  upon care guide rescheduling.  [name] [facility], BSN RN Case Manager [facility] Internal Medicine Center [phone]

## 2022-07-17 ENCOUNTER — Ambulatory Visit: Payer: Medicare HMO | Admitting: Internal Medicine

## 2022-07-17 ENCOUNTER — Encounter: Payer: Self-pay | Admitting: Internal Medicine

## 2022-07-17 VITALS — BP 128/68 | HR 72 | Temp 98.2°F | Ht 68.0 in | Wt 219.0 lb

## 2022-07-17 DIAGNOSIS — R0602 Shortness of breath: Secondary | ICD-10-CM

## 2022-07-17 DIAGNOSIS — J4521 Mild intermittent asthma with (acute) exacerbation: Secondary | ICD-10-CM | POA: Diagnosis not present

## 2022-07-17 MED ORDER — AZELASTINE HCL 0.1 % NA SOLN: 1.0000 | Freq: Every day | NASAL | 12 refills | Status: DC

## 2022-07-17 MED ORDER — FLUTICASONE PROPIONATE 50 MCG/ACT NA SUSP
1.0000 | Freq: Every day | NASAL | 2 refills | Status: DC
Start: 2022-07-17 — End: 2023-12-30

## 2022-07-17 NOTE — Progress Notes (Signed)
Brandon Joyce    [id number]    12/12/1950  Primary Care Physician:Pcp, No  Referring Physician: Ginnie Smart, MD [address] [place],  [place] [address] Reason for Consultation: shortness of breath Date of Consultation: 07/17/2022  Chief complaint:   Chief Complaint  Patient presents with   Consult    States no history of asthma.  Sob with exertion.  Comes and goes, not all the time.  Sometimes right before he falls asleep he feels like he is not going to catch his next breath.  Started right before Thanksgiving.  No recent breathing test.     HPI: Brandon Joyce is a 72 y.o. man who presents for new patient evaluation for shortness of breath. Symptoms progressing over the last 2 years. Past medical history of HTN, chronic HFpEFand ascending thoracic aneurysm 4.2 cm  Dyspnea almost always with exertion, improved with rest. Causing panic feeling.   Went to the ED round thanksgiving 2023. CTPE study negative. Treated with bronchodilators with improvement. Referred to pulmonary at that time.   Had another episode in May 2024 with lower extremity edema. Treated with diuretics with improvement.   Had recurrent pneumonia as a child several times. However he was in track in high school and played football and as an adult was very active.   Feels a dramatic drop in his functional capacity over the last 2 years.   He does have nasal congestion and drainage that wakes him up in the middle of the night.  He takes phenylephrine nasal spray daily.  Has chronic nasal drainage. Says he can't live without this.   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  Exposures: lives at home with his wife Smoking history: never smoker, passive smoke exposure with his spouse  Social History   Occupational History    Employer: [employer]  Tobacco Use   Smoking status: Never   Smokeless tobacco: Never  Vaping Use   Vaping Use: Never used   Substance and Sexual Activity   Alcohol use: Yes    Alcohol/week: 21.0 standard drinks of alcohol    Types: 21 Standard drinks or equivalent per week    Comment: beer and whiskey - has increased   Drug use: No   Sexual activity: Not Currently    Partners: Female    Birth control/protection: Condom    Comment: accepted condoms    Relevant family history:  Family History  Problem Relation Age of Onset   Diverticulitis Mother    Heart disease Mother    Atrial fibrillation Mother    Heart disease Father        CABG   Colon cancer Brother    Lung disease Neg Hx     Past Medical History:  Diagnosis Date   Alcoholism (HCC)    Chronic kidney disease    followed by nephro   ED (erectile dysfunction)    HIV disease (HCC)    HLD (hyperlipidemia)    HTN (hypertension)    Pneumonia    Strain of neck muscle 03/23/2017    Past Surgical History:  Procedure Laterality Date   BACK SURGERY     INCISION AND DRAINAGE ABSCESS N/A 12/07/2019   Procedure: INCISION AND DRAINAGE BUTTOCK ABSCESS;  Surgeon: Griselda Miner, MD;  Location: [facility];  Service: General;  Laterality: N/A;   LEFT HEART CATHETERIZATION WITH CORONARY ANGIOGRAM N/A 07/28/2013   Procedure: LEFT  HEART CATHETERIZATION WITH CORONARY ANGIOGRAM;  Surgeon: Corky Crafts, MD;  Location: [facility];  Service: Cardiovascular;  Laterality: N/A;   MASS EXCISION Left 01/25/2020   Procedure: 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;  Surgeon: Griselda Miner, MD;  Location: [facility];  Service: General;  Laterality: Left;   ROTATOR CUFF REPAIR Right 04-27-2015   TONSILLECTOMY       Physical Exam: Blood pressure 128/68, pulse 72, temperature 98.2 F (36.8 C), temperature source Oral, height 5\' 8"  (1.727 m), weight 219 lb (99.3 kg), SpO2 92 %. Gen:      No acute distress, fatigued, overweight, chronically ill ENT:  no nasal polyps, mucus membranes moist, conjunctival injection, watery  eyes Lungs:    No increased respiratory effort, symmetric chest wall excursion, clear to auscultation bilaterally, no wheezes or crackles CV:         Regular rate and rhythm; no murmurs, rubs, or gallops.  No pedal edema Abd:      + bowel sounds; soft, non-tender; no distension MSK: no acute synovitis of DIP or PIP joints, no mechanics hands.  Skin:      spider telangiectasias on nose and face Neuro: normal speech, no focal facial asymmetry Psych: alert and oriented x3, normal mood and affect   Data Reviewed/Medical Decision Making:  Independent interpretation of tests: Imaging:  Review of patient's CT A/P 2021 visualized lung window images revealed no pulmonary process. The patient's images have been independently reviewed by me.    PFTs:  Labs:  Lab Results  Component Value Date   NA 139 06/04/2022   K 4.4 06/04/2022   CO2 19 (L) 06/04/2022   GLUCOSE 93 06/04/2022   BUN 12 06/04/2022   CREATININE 1.11 06/04/2022   CALCIUM 9.0 06/04/2022   EGFR 71 06/04/2022   GFRNONAA 58 (L) 01/23/2020   Lab Results  Component Value Date   WBC 6.3 03/04/2022   WBC 5.9 03/04/2022   HGB 15.5 03/04/2022   HGB 15.7 03/04/2022   HCT 45.6 03/04/2022   HCT 47.5 03/04/2022   MCV 88 03/04/2022   MCV 91 03/04/2022   PLT 226 03/04/2022   PLT 215 03/04/2022     Immunization status:  Immunization History  Administered Date(s) Administered   Fluad Quad(high Dose 65+) 03/22/2021   Hepatitis A 12/11/2005, 03/19/2011   Hepatitis B 12/11/2005, 01/23/2006, 06/16/2006   Hepatitis B, ADULT 10/12/2013, 04/17/2014   Influenza Split 11/13/2010, 12/28/2011   Influenza Whole 11/24/2005, 12/29/2006, 11/09/2007, 12/26/2008   Influenza,inj,Quad PF,6+ Mos 11/24/2012, 10/12/2013, 11/27/2014, 02/09/2018, 12/09/2018, 01/28/2022   Influenza,inj,quad, With Preservative 01/08/2017   Influenza-Unspecified 12/06/2015   PPD Test 04/20/2006   Pneumococcal Conjugate-13 02/09/2018   Pneumococcal Polysaccharide-23  08/25/2005, 11/13/2010, 12/09/2018   Tdap 12/29/2016     I reviewed prior external note(s) from cardiology  I reviewed the result(s) of the labs and imaging as noted above.   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  Plan/Recommendations: Full set of PFTs, follow up with me afterwards  start Flonase in morning Astelin in afternoon We discussed disease management and progression at length today.     Return to Care: Return in about 2 months (around 09/16/2022) for follow up with PFT and same day appointment.  Durel Salts, MD Pulmonary and Critical Care Medicine [facility] Office:[phone]  CC: Johny Sax  C, MD

## 2022-07-17 NOTE — Patient Instructions (Addendum)
Please schedule follow up scheduled with myself in 1-2 months.  If my schedule is not open yet, we will contact you with a reminder closer to that time. Please call [phone] if you haven't heard from [place] a month before.   Before your next visit I would like you to have: Full set of PFTs, follow up with me afterwards  4way nasal spray and affrin are bad for your blood pressure and probably making things worse. Would stop this cold [place].   Start  Flonase in morning Astelin in afternoon Flonase/astelin - 1 spray on each side of your nose twice a day for first week, then 1 spray on each side.   Instructions for use: If you also use a saline nasal spray or rinse, use that first. Position the head with the chin slightly tucked. Use the right hand to spray into the left nostril and the right hand to spray into the left nostril.   Point the bottle away from the septum of your nose (cartilage that divides the two sides of your nose).  Hold the nostril closed on the opposite side from where you will spray Spray once and gently sniff to pull the medicine into the higher parts of your nose.  Don't sniff too hard as the medicine will drain down the back of your throat instead. Repeat with a second spray on the same side if prescribed. Repeat on the other side of your nose.

## 2022-07-22 ENCOUNTER — Encounter: Payer: Self-pay | Admitting: Infectious Diseases

## 2022-07-22 ENCOUNTER — Other Ambulatory Visit: Payer: Self-pay

## 2022-07-22 ENCOUNTER — Ambulatory Visit (INDEPENDENT_AMBULATORY_CARE_PROVIDER_SITE_OTHER): Payer: Medicare HMO | Admitting: Infectious Diseases

## 2022-07-22 VITALS — BP 128/77 | HR 96 | Temp 98.0°F | Ht 68.0 in | Wt 221.3 lb

## 2022-07-22 DIAGNOSIS — Z113 Encounter for screening for infections with a predominantly sexual mode of transmission: Secondary | ICD-10-CM

## 2022-07-22 DIAGNOSIS — N1831 Chronic kidney disease, stage 3a: Secondary | ICD-10-CM | POA: Diagnosis not present

## 2022-07-22 DIAGNOSIS — B20 Human immunodeficiency virus [HIV] disease: Secondary | ICD-10-CM

## 2022-07-22 DIAGNOSIS — Z79899 Other long term (current) drug therapy: Secondary | ICD-10-CM | POA: Diagnosis not present

## 2022-07-22 DIAGNOSIS — I130 Hypertensive heart and chronic kidney disease with heart failure and stage 1 through stage 4 chronic kidney disease, or unspecified chronic kidney disease: Secondary | ICD-10-CM | POA: Diagnosis not present

## 2022-07-22 DIAGNOSIS — I5032 Chronic diastolic (congestive) heart failure: Secondary | ICD-10-CM | POA: Diagnosis not present

## 2022-07-22 DIAGNOSIS — I10 Essential (primary) hypertension: Secondary | ICD-10-CM

## 2022-07-22 NOTE — Assessment & Plan Note (Signed)
He has f/u with Dr Jens Som 09-17-22.  Some concern about his LE edema His wt is stable.  Will see if Dr Jens Som can see him sooner.

## 2022-07-22 NOTE — Assessment & Plan Note (Signed)
Well controlled today.

## 2022-07-22 NOTE — Progress Notes (Signed)
Subjective:    Patient ID: Brandon Joyce, male  DOB: March 19, 1950, 72 y.o.        MRN: [id number]   HPI 72 yo M with HIV+, statin related myalgias, hyperlipidemia, erectile dysfunction, hypertension, ETOH use.  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 He has thoracic aortic aneurysm as well, repeat CTA 01-2022.  Had another work related injury fall 2018, C6 pinched nerve.  He had a nerve block which helped for ~ 48h. He had c-spine surgery 10-23-17.  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    Got penoplasty 07-2017 for ED.  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 No problems with this.  His Cr has increased some (1.25 last).    Wife is WC bound, prev bed-sore resolved. Has home health nurse helping. Never smoker, wife smokes. She is becoming less mobile (feels like she is a burden).  Wife had lipoma removed from her neck last week.  Has concerns about his parents- Mother with continence issues. She fell and broke her nose this week.  Has help from his sister. Dad deceased 08/26/21.     Was in hospital in [facility] 12-2021 for 2 nights, for asthma,  was given steroid taper as well as albuterol inh. He had CT chest (-).  Had ? Asthma attack. His RVP was (-). TTE 65-70% Grade 2 diastolic dysfunction.    Also followed by CV last month.  He had spironolactone added due to worsened SOB LE edema. He has been urinating more. Has lost 7# since. He also started wearing support hose.  He is allergic to sulfa so lasix/hctz avoided.  He was to f/u with pulm and CV at [facility].  He has not gotten appt with Pulm at [facility]. Would f/u at pulm [place] if referred.  He was not able to afford symbicort, does have albuterol (using 3-4x/day).    Has diet plan and will start on 06-09-22.  He was seen by pulm 5-23. (Plan for full PFT at next visit) Was seen in [facility] for CHF exacerbation. Needs f/u of renal fxn (1.46).  States he has seen 3 heart doctors.  Has compression socks, only during  the day. They do help.   He had to rest on his way to clinic today.  Does not feel like fluid pill is working. Still has swelling on his legs.  Was on his feet all day long for 2 days helping cut up a tree.  Still using nasal spray. Wt has been stable.    HIV 1 RNA Quant  Date Value  05/30/2021 NOT DETECTED copies/mL  03/08/2021 Not Detected Copies/mL  05/21/2020 Not Detected Copies/mL   HIV-1 RNA Viral Load (copies/mL)  Date Value  03/04/2022 <20   CD4 T Cell Abs (/uL)  Date Value  05/30/2021 612  05/21/2020 627  08/25/2019 530     Health Maintenance  Topic Date Due  . Medicare Annual Wellness (AWV)  Never done  . COVID-19 Vaccine (1) Never done  . Zoster Vaccines- Shingrix (1 of 2) Never done  . Colonoscopy  02/27/2019  . INFLUENZA VACCINE  09/25/2022  . DTaP/Tdap/Td (2 - Td or Tdap) 12/30/2026  . Pneumonia Vaccine 40+ Years old  Completed  . Hepatitis C Screening  Completed  . HPV VACCINES  Aged Out      Review of Systems  Constitutional:  Negative for chills, fever and weight loss.  Respiratory:  Positive for cough and shortness of breath. Negative for  sputum production.   Cardiovascular:  Positive for chest pain (with turning, non-exertional.) and leg swelling.  Gastrointestinal:  Negative for constipation and diarrhea.  Genitourinary:  Negative for dysuria.    Please see HPI. All other systems reviewed and negative.     Objective:  Physical Exam Vitals reviewed.  Constitutional:      Appearance: Normal appearance.  HENT:     Mouth/Throat:     Mouth: Mucous membranes are moist.     Pharynx: No oropharyngeal exudate.  Eyes:     Extraocular Movements: Extraocular movements intact.     Pupils: Pupils are equal, round, and reactive to light.  Cardiovascular:     Rate and Rhythm: Normal rate and regular rhythm.  Pulmonary:     Effort: Pulmonary effort is normal.     Breath sounds: Normal breath sounds.  Abdominal:     General: Bowel sounds are  normal. There is no distension.     Palpations: Abdomen is soft.     Tenderness: There is no abdominal tenderness.  Musculoskeletal:     Cervical back: Normal range of motion and neck supple.     Right lower leg: Edema present.     Left lower leg: Edema present.  Neurological:     General: No focal deficit present.     Mental Status: He is alert.  Psychiatric:        Mood and Affect: Mood normal.          Assessment & Plan:

## 2022-07-22 NOTE — Assessment & Plan Note (Signed)
He appears to be doing well Will recheck his labs at his f/u visit.

## 2022-07-22 NOTE — Assessment & Plan Note (Signed)
Will recheck his BMP today at advice of his ED f/u

## 2022-07-23 ENCOUNTER — Ambulatory Visit: Payer: Medicare HMO

## 2022-07-24 LAB — BASIC METABOLIC PANEL
BUN/Creatinine Ratio: 9 — ABNORMAL LOW (ref 10–24)
BUN: 10 mg/dL (ref 8–27)
CO2: 19 mmol/L — ABNORMAL LOW (ref 20–29)
Calcium: 9.1 mg/dL (ref 8.6–10.2)
Chloride: 104 mmol/L (ref 96–106)
Creatinine, Ser: 1.12 mg/dL (ref 0.76–1.27)
Glucose: 94 mg/dL (ref 70–99)
Potassium: 3.9 mmol/L (ref 3.5–5.2)
Sodium: 138 mmol/L (ref 134–144)
eGFR: 70 mL/min/{1.73_m2} (ref >59)

## 2022-07-28 ENCOUNTER — Telehealth: Payer: Self-pay

## 2022-07-28 NOTE — Progress Notes (Signed)
  Care Management   Outreach Note  07/28/2022 Name: Brandon Joyce MRN: [id number] DOB: [birth year]  An unsuccessful outreach attempt was made today for a scheduled CCM visit.   Follow Up Plan:  A HIPAA compliant phone message was left for the patient providing contact information and requesting a return call.  The care management team will reach out to the patient again over the next 7 days.  If patient returns call to provider office, please advise to call Embedded Care Management Care Guide Sherlene Shams* at [phone]*  Penne Lash, RMA Care Guide [facility]  [place], [place] [address] Direct Dial: [phone] [email or web address]

## 2022-08-06 NOTE — Progress Notes (Deleted)
Cardiology Clinic Note   Patient Name: Brandon Joyce Date of Encounter: 08/06/2022  Primary Care Provider:  Pcp, No Primary Cardiologist:  Olga Millers, MD  Patient Profile    72 year old male with history of hypertension, ascending thoracic aortic aneurysm, adrenal nodule, chronic dyspnea on exertion, with asthma diagnosis, hyperlipidemia, chronic kidney disease, and HIV.  He is intolerant to beta-blockers due to bradycardia.  Past Medical History    Past Medical History:  Diagnosis Date   Alcoholism (HCC)    Chronic kidney disease    followed by nephro   ED (erectile dysfunction)    HIV disease (HCC)    HLD (hyperlipidemia)    HTN (hypertension)    Pneumonia    Strain of neck muscle 03/23/2017   Past Surgical History:  Procedure Laterality Date   BACK SURGERY     INCISION AND DRAINAGE ABSCESS N/A 12/07/2019   Procedure: INCISION AND DRAINAGE BUTTOCK ABSCESS;  Surgeon: Griselda Miner, MD;  Location: [facility];  Service: General;  Laterality: N/A;   LEFT HEART CATHETERIZATION WITH CORONARY ANGIOGRAM N/A 07/28/2013   Procedure: LEFT HEART CATHETERIZATION WITH CORONARY ANGIOGRAM;  Surgeon: Corky Crafts, MD;  Location: [facility] CATH LAB;  Service: Cardiovascular;  Laterality: N/A;   MASS EXCISION Left 01/25/2020   Procedure: 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;  Surgeon: Griselda Miner, MD;  Location: [facility];  Service: General;  Laterality: Left;   ROTATOR CUFF REPAIR Right 04-27-2015   TONSILLECTOMY      Allergies  Allergies  Allergen Reactions   Other Rash and Other (See Comments)    Other reaction(s): Other (See Comments), Rash Childhood allergy Childhood allergy Causes "headaches"   Sulfa Antibiotics Rash and Other (See Comments)    Childhood allergy Childhood allergy Other reaction(s): Unknown Other reaction(s): Other (See Comments) Childhood allergy Childhood allergy  Other reaction(s): Other (See  Comments) Childhood allergy Other reaction(s): Rash Childhood allergy Other reaction(s): Other (See Comments) Childhood allergy Childhood allergy Other reaction(s): Other (See Comments) Childhood allergy Other reaction(s): Other (See Comments) Childhood allergy   Sulfacetamide Sodium Rash    Other reaction(s): Other (See Comments) Childhood allergy   Sulfasalazine Rash    Other reaction(s): Other (See Comments) Childhood allergy   Atenolol Other (See Comments)    "dropped blood pressure" "dropped blood pressure"   Isosorbide Other (See Comments)    Causes "headaches" Causes "headaches"   Nitrates, Organic     Causes "headaches"   Pentaerythritol Tetranitrate Other (See Comments)    Causes "headaches" Causes "headaches"    History of Present Illness    Mr. Brandon Joyce returns today for ongoing assessment and management of chronic diastolic heart failure, hypertension, with chronic dyspnea.  On last office visit the patient was started on spironolactone 12.5 mg daily as he was unable to take HCTZ or Lasix due to sulfa allergy.  Follow-up BMET revealed a creatinine of 1.12, and potassium of 3.9 on 07/22/2022.  He was also referred back to pulmonologist through [facility].  He was counseled on EtOH abuse and asked to cut back.  Home Medications    Current Outpatient Medications  Medication Sig Dispense Refill   albuterol (VENTOLIN HFA) 108 (90 Base) MCG/ACT inhaler Inhale 2 puffs into the lungs every 6 (six) hours as needed for wheezing or shortness of breath. 8 g 2   amLODipine (NORVASC) 10 MG tablet Take 1 tablet (10 mg total) by mouth daily. 90 tablet 3   atorvastatin (LIPITOR) 20 MG tablet Take  1 tablet by mouth once daily 30 tablet 0   azelastine (ASTELIN) 0.1 % nasal spray Place 1 spray into both nostrils daily. Use in each nostril as directed 30 mL 12   BIKTARVY 50-200-25 MG TABS tablet Take 1 tablet by mouth daily. 90 tablet 5   fluticasone (FLONASE) 50 MCG/ACT nasal spray Place  1 spray into both nostrils daily. 18.2 mL 2   fluticasone-salmeterol (WIXELA INHUB) 100-50 MCG/ACT AEPB Inhale 1 puff into the lungs 2 (two) times daily. 60 each 2   hydrALAZINE (APRESOLINE) 50 MG tablet Take 1 tablet (50 mg total) by mouth 3 (three) times daily. 270 tablet 3   oxymetazoline (AFRIN) 0.05 % nasal spray Place 1 spray into both nostrils Once PRN for congestion.     spironolactone (ALDACTONE) 25 MG tablet Take 0.5 tablets (12.5 mg total) by mouth daily. (Patient not taking: Reported on 07/17/2022) 90 tablet 3   No current facility-administered medications for this visit.     Family History    Family History  Problem Relation Age of Onset   Diverticulitis Mother    Heart disease Mother    Atrial fibrillation Mother    Heart disease Father        CABG   Colon cancer Brother    Lung disease Neg Hx    He indicated that his mother is deceased. He indicated that his father is deceased. He indicated that his brother is alive. He indicated that his maternal grandmother is deceased. He indicated that his maternal grandfather is deceased. He indicated that his paternal grandmother is deceased. He indicated that his paternal grandfather is deceased. He indicated that the status of his neg hx is unknown.  Social History    Social History   Socioeconomic History   Marital status: Married    Spouse name: Not on file   Number of children: 3   Years of education: Not on file   Highest education level: GED or equivalent  Occupational History    Employer: [employer]  Tobacco Use   Smoking status: Never   Smokeless tobacco: Never  Vaping Use   Vaping Use: Never used  Substance and Sexual Activity   Alcohol use: Yes    Alcohol/week: 21.0 standard drinks of alcohol    Types: 21 Standard drinks or equivalent per week    Comment: beer and whiskey - has increased   Drug use: No   Sexual activity: Not Currently    Partners: Female    Birth control/protection: Condom     Comment: accepted condoms  Other Topics Concern   Not on file  Social History Narrative   Not on file   Social Determinants of Health   Financial Resource Strain: Low Risk  (06/09/2022)   Overall Financial Resource Strain (CARDIA)    Difficulty of Paying Living Expenses: Not very hard  Recent Concern: Financial Resource Strain - Medium Risk (06/04/2022)   Overall Financial Resource Strain (CARDIA)    Difficulty of Paying Living Expenses: Somewhat hard  Food Insecurity: No Food Insecurity (06/09/2022)   Hunger Vital Sign    Worried About Running Out of Food in the Last Year: Never true    Ran Out of Food in the Last Year: Never true  Transportation Needs: No Transportation Needs (06/09/2022)   PRAPARE - Administrator, Civil Service (Medical): No    Lack of Transportation (Non-Medical): No  Physical Activity: Insufficiently Active (06/09/2022)   Exercise Vital Sign    Days  of Exercise per Week: 3 days    Minutes of Exercise per Session: 30 min  Stress: No Stress Concern Present (06/09/2022)   [employer] of Occupational Health - Occupational Stress Questionnaire    Feeling of Stress : Only a little  Recent Concern: Stress - Stress Concern Present (06/04/2022)   [employer] of Occupational Health - Occupational Stress Questionnaire    Feeling of Stress : To some extent  Social Connections: Moderately Isolated (06/09/2022)   Social Connection and Isolation Panel [NHANES]    Frequency of Communication with Friends and Family: Three times a week    Frequency of Social Gatherings with Friends and Family: Twice a week    Attends Religious Services: Never    Database administrator or Organizations: No    Attends Banker Meetings: Never    Marital Status: Married  Catering manager Violence: Not At Risk (06/09/2022)   Humiliation, Afraid, Rape, and Kick questionnaire    Fear of Current or Ex-Partner: No    Emotionally Abused: No    Physically Abused: No     Sexually Abused: No     Review of Systems    General:  No chills, fever, night sweats or weight changes.  Cardiovascular:  No chest pain, dyspnea on exertion, edema, orthopnea, palpitations, paroxysmal nocturnal dyspnea. Dermatological: No rash, lesions/masses Respiratory: No cough, dyspnea Urologic: No hematuria, dysuria Abdominal:   No nausea, vomiting, diarrhea, bright red blood per rectum, melena, or hematemesis Neurologic:  No visual changes, wkns, changes in mental status. All other systems reviewed and are otherwise negative except as noted above.     Physical Exam    VS:  There were no vitals taken for this visit. , BMI There is no height or weight on file to calculate BMI.     GEN: Well nourished, well developed, in no acute distress. HEENT: normal. Neck: Supple, no JVD, carotid bruits, or masses. Cardiac: RRR, no murmurs, rubs, or gallops. No clubbing, cyanosis, edema.  Radials/DP/PT 2+ and equal bilaterally.  Respiratory:  Respirations regular and unlabored, clear to auscultation bilaterally. GI: Soft, nontender, nondistended, BS + x 4. MS: no deformity or atrophy. Skin: warm and dry, no rash. Neuro:  Strength and sensation are intact. Psych: Normal affect.  Accessory Clinical Findings    ECG personally reviewed by me today- *** - No acute changes  Lab Results  Component Value Date   WBC 6.3 03/04/2022   WBC 5.9 03/04/2022   HGB 15.5 03/04/2022   HGB 15.7 03/04/2022   HCT 45.6 03/04/2022   HCT 47.5 03/04/2022   MCV 88 03/04/2022   MCV 91 03/04/2022   PLT 226 03/04/2022   PLT 215 03/04/2022   Lab Results  Component Value Date   CREATININE 1.12 07/22/2022   BUN 10 07/22/2022   NA 138 07/22/2022   K 3.9 07/22/2022   CL 104 07/22/2022   CO2 19 (L) 07/22/2022   Lab Results  Component Value Date   ALT 35 03/04/2022   AST 22 03/04/2022   ALKPHOS 64 03/04/2022   BILITOT 0.4 03/04/2022   Lab Results  Component Value Date   CHOL 164 03/04/2022    HDL 37 (L) 03/04/2022   LDLCALC 76 03/04/2022   TRIG 315 (H) 03/04/2022   CHOLHDL 4.4 03/04/2022    Lab Results  Component Value Date   HGBA1C 5.9 (H) 11/11/2018    Review of Prior Studies: CT of the Chest 01/15/2022 IMPRESSION:  1. No acute findings.  2.  2.6 cm indeterminate right adrenal gland nodule. Nonemergent MRI can be performed for further evaluation.  3.  Ascending thoracic aortic aneurysm measuring 4.2 cm in AP dimension, unchanged.    Nuclear medicine stress test 07/06/2013  Normal stress nuclear study. Suspect "false positive" LVH-related ECG changes, but cannot fully exclude the possibility of "balanced ischemia".    Renal Artery Ultrasound 10/27/2019 Mild-to-moderate LEFT-sided hydroureteronephrosis. 2. Complex appearing cystic area with internal debris or calcification in the RIGHT pelvis lateral to the urinary bladder could reflect changes of prior inguinal herniorrhaphy or remote postoperative changes. No surgical history matching this is found in the chart and the findings are atypical based on the size for hernia repair. Consider CT of the abdomen and pelvis for further assessment particularly if there are pelvic symptoms as a complex collection/abscess or post infectious debris containing collection or cystic neoplasm could potentially have this appearance. 3. RIGHT adrenal nodule is suspected just above the RIGHT kidney. Dedicated adrenal protocol may be helpful for further characterization. A nodule with seen in this area on a previous study, images not available for review and density values likely not diagnostic for adenoma at that time. 4. Hepatic steatosis.   Echocardiogram 01/13/2022 Impression   Left Ventricle: Systolic function is normal. EF: 65-70%.   Left Ventricle: There is mild concentric hypertrophy.   Left Ventricle: There is probable stage II diastolic dysfunction.   Aortic Valve: The aortic valve is tricuspid. The leaflets are  mildly thickened and exhibit probably normal excursion. There is moderate sclerosis.  Assessment & Plan   1.  ***     {Are you ordering a CV Procedure (e.g. stress test, cath, DCCV, TEE, etc)?   Press F2        :[id number]}   Signed, Bettey Mare. Liborio Nixon, ANP, AACC   08/06/2022 10:39 AM      Office [phone] Fax [phone]  Notice: This dictation was prepared with Dragon dictation along with smaller phrase technology. Any transcriptional errors that result from this process are unintentional and may not be corrected upon review.

## 2022-08-15 ENCOUNTER — Ambulatory Visit: Payer: Medicare HMO | Admitting: Adult Health

## 2022-09-10 ENCOUNTER — Encounter: Payer: Self-pay | Admitting: *Deleted

## 2022-09-10 ENCOUNTER — Telehealth: Payer: Medicare HMO

## 2022-09-10 ENCOUNTER — Telehealth: Payer: Self-pay | Admitting: *Deleted

## 2022-09-10 NOTE — Progress Notes (Deleted)
HPI: FU hypertension. Nuclear study May of 2015 was normal with ejection fraction 59%. Cardiac catheterization June 2015 showed minimal nonobstructive coronary disease and normal LV function. Had atenolol DCed previously due to bradycardia.  Echocardiogram at [facility] November 2023 showed normal LV function, mild left ventricular hypertrophy, grade 2 diastolic dysfunction.  CT November 2023 at [facility] showed 2.6 cm right adrenal gland nodule and MRI recommended, ascending thoracic aortic aneurysm measuring 4.2 cm.  Since last seen,   Current Outpatient Medications  Medication Sig Dispense Refill   albuterol (VENTOLIN HFA) 108 (90 Base) MCG/ACT inhaler Inhale 2 puffs into the lungs every 6 (six) hours as needed for wheezing or shortness of breath. 8 g 2   amLODipine (NORVASC) 10 MG tablet Take 1 tablet (10 mg total) by mouth daily. 90 tablet 3   atorvastatin (LIPITOR) 20 MG tablet Take 1 tablet by mouth once daily 30 tablet 0   azelastine (ASTELIN) 0.1 % nasal spray Place 1 spray into both nostrils daily. Use in each nostril as directed 30 mL 12   BIKTARVY 50-200-25 MG TABS tablet Take 1 tablet by mouth daily. 90 tablet 5   fluticasone (FLONASE) 50 MCG/ACT nasal spray Place 1 spray into both nostrils daily. 18.2 mL 2   fluticasone-salmeterol (WIXELA INHUB) 100-50 MCG/ACT AEPB Inhale 1 puff into the lungs 2 (two) times daily. 60 each 2   hydrALAZINE (APRESOLINE) 50 MG tablet Take 1 tablet (50 mg total) by mouth 3 (three) times daily. 270 tablet 3   oxymetazoline (AFRIN) 0.05 % nasal spray Place 1 spray into both nostrils Once PRN for congestion.     spironolactone (ALDACTONE) 25 MG tablet Take 0.5 tablets (12.5 mg total) by mouth daily. (Patient not taking: Reported on 07/17/2022) 90 tablet 3   No current facility-administered medications for this visit.     Past Medical History:  Diagnosis Date   Alcoholism (HCC)    Chronic kidney disease    followed by nephro   ED (erectile dysfunction)     HIV disease (HCC)    HLD (hyperlipidemia)    HTN (hypertension)    Pneumonia    Strain of neck muscle 03/23/2017    Past Surgical History:  Procedure Laterality Date   BACK SURGERY     INCISION AND DRAINAGE ABSCESS N/A 12/07/2019   Procedure: INCISION AND DRAINAGE BUTTOCK ABSCESS;  Surgeon: Griselda Miner, MD;  Location: [facility];  Service: General;  Laterality: N/A;   LEFT HEART CATHETERIZATION WITH CORONARY ANGIOGRAM N/A 07/28/2013   Procedure: LEFT HEART CATHETERIZATION WITH CORONARY ANGIOGRAM;  Surgeon: Corky Crafts, MD;  Location: [facility] CATH LAB;  Service: Cardiovascular;  Laterality: N/A;   MASS EXCISION Left 01/25/2020   Procedure: 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;  Surgeon: Griselda Miner, MD;  Location: [facility];  Service: General;  Laterality: Left;   ROTATOR CUFF REPAIR Right 04-27-2015   TONSILLECTOMY      Social History   Socioeconomic History   Marital status: Married    Spouse name: Not on file   Number of children: 3   Years of education: Not on file   Highest education level: GED or equivalent  Occupational History    Employer: [employer]  Tobacco Use   Smoking status: Never   Smokeless tobacco: Never  Vaping Use   Vaping status: Never Used  Substance and Sexual Activity   Alcohol use: Yes    Alcohol/week: 21.0 standard drinks of  alcohol    Types: 21 Standard drinks or equivalent per week    Comment: beer and whiskey - has increased   Drug use: No   Sexual activity: Not Currently    Partners: Female    Birth control/protection: Condom    Comment: accepted condoms  Other Topics Concern   Not on file  Social History Narrative   Not on file   Social Determinants of Health   Financial Resource Strain: Low Risk  (06/09/2022)   Overall Financial Resource Strain (CARDIA)    Difficulty of Paying Living Expenses: Not very hard  Recent Concern: Financial Resource Strain - Medium Risk (06/04/2022)    Overall Financial Resource Strain (CARDIA)    Difficulty of Paying Living Expenses: Somewhat hard  Food Insecurity: No Food Insecurity (06/09/2022)   Hunger Vital Sign    Worried About Running Out of Food in the Last Year: Never true    Ran Out of Food in the Last Year: Never true  Transportation Needs: No Transportation Needs (06/09/2022)   PRAPARE - Administrator, Civil Service (Medical): No    Lack of Transportation (Non-Medical): No  Physical Activity: Insufficiently Active (06/09/2022)   Exercise Vital Sign    Days of Exercise per Week: 3 days    Minutes of Exercise per Session: 30 min  Stress: No Stress Concern Present (06/09/2022)   [employer] of Occupational Health - Occupational Stress Questionnaire    Feeling of Stress : Only a little  Recent Concern: Stress - Stress Concern Present (06/04/2022)   [employer] of Occupational Health - Occupational Stress Questionnaire    Feeling of Stress : To some extent  Social Connections: Moderately Isolated (06/09/2022)   Social Connection and Isolation Panel [NHANES]    Frequency of Communication with Friends and Family: Three times a week    Frequency of Social Gatherings with Friends and Family: Twice a week    Attends Religious Services: Never    Database administrator or Organizations: No    Attends Banker Meetings: Never    Marital Status: Married  Catering manager Violence: Not At Risk (06/09/2022)   Humiliation, Afraid, Rape, and Kick questionnaire    Fear of Current or Ex-Partner: No    Emotionally Abused: No    Physically Abused: No    Sexually Abused: No    Family History  Problem Relation Age of Onset   Diverticulitis Mother    Heart disease Mother    Atrial fibrillation Mother    Heart disease Father        CABG   Colon cancer Brother    Lung disease Neg Hx     ROS: no fevers or chills, productive cough, hemoptysis, dysphasia, odynophagia, melena, hematochezia, dysuria,  hematuria, rash, seizure activity, orthopnea, PND, pedal edema, claudication. Remaining systems are negative.  Physical Exam: Well-developed well-nourished in no acute distress.  Skin is warm and dry.  HEENT is normal.  Neck is supple.  Chest is clear to auscultation with normal expansion.  Cardiovascular exam is regular rate and rhythm.  Abdominal exam nontender or distended. No masses palpated. Extremities show no edema. neuro grossly intact  ECG- personally reviewed  A/P  1 thoracic aortic aneurysm-plan follow-up CTA November 2024.  2 hypertension-blood pressure controlled.  Continue present medical regimen.  3 hyperlipidemia-continue statin.  4 adrenal nodule-I previously asked him to follow-up at [facility] for this issue with his primary care physician.  I again discussed this with him today.  5 HIV-managed by primary care.  Olga Millers, MD

## 2022-09-10 NOTE — Telephone Encounter (Signed)
  Care Management   Visit Note  09/10/2022 Name: Brandon Joyce MRN: [id number] DOB: Jan 19, 1951  Subjective: Brandon Joyce is a 72 y.o. year old male who is a primary care patient of Pcp, No. The Care Management team was consulted for assistance.      Engaged with patient Engaged with patient by telephone.   Assessment: Patient reports he has all medications including Breo Ellipta inhaler and taking as prescribed.  Patient continues seeing Dr. Ninetta Joyce as primary care provider and feels this is working well for him.  Patient reports his left lower leg has been sore over the past few days, no swelling present, pt not sure if he pulled a muscle, pt states he will contact Dr. Ninetta Joyce for guidance as to whether may need to see orthopedics.  Interventions: Evaluation of current treatment plan related to HTN, Asthma, and CHF and patient's adherence to plan as established by provider. Advised patient to continue checking blood pressure at least 3 x per week and keeping a log, pt reports blood pressure has been "okay", encouraged pt to weigh daily and record, pt has not been consistent with weighing, states weight is 212-214 pounds. Provided education to patient re: HTN, Asthma, CHF- reinforced Asthma and HF action plan, importance of adherence to low sodium diet. Reviewed medications with patient and discussed importance of taking as prescribed and timely refills Advised patient, providing education and rationale, to monitor blood pressure daily and record, calling Dr. Ninetta Joyce for findings outside established parameters.  Advised patient, providing education and rationale, to weigh daily and record, calling Dr. Ninetta Joyce  for weight gain of 3lbs overnight or 5 pounds in a week.   Recommendations:   Follow up with Dr. Ninetta Joyce for any concerns you may have, take all medications as prescribed, abstain from alcohol, follow HF and Asthma action plan, call your doctor early on for change in health status/  symptoms.  Consent to Services:  Patient previously consented  Plan: Telephone follow up appointment with care management team member scheduled for:  11/13/22 at 945 am  Brandon Joyce [facility], BSN RN Case Manager [facility] Internal Medicine [phone]

## 2022-09-12 ENCOUNTER — Encounter (HOSPITAL_BASED_OUTPATIENT_CLINIC_OR_DEPARTMENT_OTHER): Payer: Self-pay

## 2022-09-12 ENCOUNTER — Emergency Department (HOSPITAL_BASED_OUTPATIENT_CLINIC_OR_DEPARTMENT_OTHER): Payer: Medicare HMO

## 2022-09-12 ENCOUNTER — Emergency Department (HOSPITAL_BASED_OUTPATIENT_CLINIC_OR_DEPARTMENT_OTHER)
Admission: EM | Admit: 2022-09-12 | Discharge: 2022-09-12 | Disposition: A | Discharge status: Home / Self Care | Payer: Medicare HMO | Attending: Emergency Medicine | Admitting: Emergency Medicine

## 2022-09-12 ENCOUNTER — Other Ambulatory Visit: Payer: Self-pay

## 2022-09-12 DIAGNOSIS — J45909 Unspecified asthma, uncomplicated: Secondary | ICD-10-CM | POA: Insufficient documentation

## 2022-09-12 DIAGNOSIS — I509 Heart failure, unspecified: Secondary | ICD-10-CM | POA: Insufficient documentation

## 2022-09-12 DIAGNOSIS — Z7901 Long term (current) use of anticoagulants: Secondary | ICD-10-CM | POA: Insufficient documentation

## 2022-09-12 DIAGNOSIS — I82492 Acute embolism and thrombosis of other specified deep vein of left lower extremity: Secondary | ICD-10-CM | POA: Diagnosis not present

## 2022-09-12 DIAGNOSIS — Z79899 Other long term (current) drug therapy: Secondary | ICD-10-CM | POA: Insufficient documentation

## 2022-09-12 DIAGNOSIS — I82402 Acute embolism and thrombosis of unspecified deep veins of left lower extremity: Secondary | ICD-10-CM | POA: Diagnosis not present

## 2022-09-12 DIAGNOSIS — Z21 Asymptomatic human immunodeficiency virus [HIV] infection status: Secondary | ICD-10-CM | POA: Insufficient documentation

## 2022-09-12 DIAGNOSIS — Z7951 Long term (current) use of inhaled steroids: Secondary | ICD-10-CM | POA: Diagnosis not present

## 2022-09-12 DIAGNOSIS — I130 Hypertensive heart and chronic kidney disease with heart failure and stage 1 through stage 4 chronic kidney disease, or unspecified chronic kidney disease: Secondary | ICD-10-CM | POA: Insufficient documentation

## 2022-09-12 DIAGNOSIS — M7989 Other specified soft tissue disorders: Secondary | ICD-10-CM | POA: Diagnosis not present

## 2022-09-12 DIAGNOSIS — I82442 Acute embolism and thrombosis of left tibial vein: Secondary | ICD-10-CM | POA: Diagnosis not present

## 2022-09-12 DIAGNOSIS — N189 Chronic kidney disease, unspecified: Secondary | ICD-10-CM | POA: Insufficient documentation

## 2022-09-12 DIAGNOSIS — M79605 Pain in left leg: Secondary | ICD-10-CM | POA: Diagnosis present

## 2022-09-12 DIAGNOSIS — M79662 Pain in left lower leg: Secondary | ICD-10-CM | POA: Diagnosis not present

## 2022-09-12 MED ORDER — APIXABAN 2.5 MG PO TABS
10.0000 mg | ORAL_TABLET | ORAL | Status: AC
  Administered 2022-09-12: 10 mg via ORAL

## 2022-09-12 MED ORDER — OXYCODONE-ACETAMINOPHEN 5-325 MG PO TABS: 1.0000 | ORAL_TABLET | Freq: Three times a day (TID) | ORAL | 0 refills | Status: AC | PRN

## 2022-09-12 MED ORDER — APIXABAN (ELIQUIS) VTE STARTER PACK (10MG AND 5MG): ORAL_TABLET | ORAL | 0 refills | Status: DC

## 2022-09-12 MED ORDER — APIXABAN (ELIQUIS) EDUCATION KIT FOR DVT/PE PATIENTS: PACK | Freq: Once | Status: AC

## 2022-09-12 NOTE — ED Triage Notes (Signed)
Patient having left leg pain for around a week. He stated it starting to feel cold and the color is abnormal.

## 2022-09-12 NOTE — Discharge Instructions (Addendum)
Please read the attached information about DVTs. I reviewed your labs from 1 month ago and your kidney function is normal.  Followup with your primary care provider.   You will not be taking any ibuprofen, Aleve, meloxicam with the Eliquis that you are being started on.  I recommend that you take the pharmacy counseling on this medication.  You are getting the first dose here in the emergency room your next dose will be tomorrow morning. Follow the instructions on the packet.   I have given you the information for a vascular doctor to follow-up with  ______________________________________________________________________________________________________________________________________ Information on my medicine - ELIQUIS (apixaban)  This medication education was reviewed with me or my healthcare representative as part of my discharge preparation.  The pharmacist that spoke with me during my hospital stay was:  Doristine Counter, [facility]  Why was Eliquis prescribed for you? Eliquis was prescribed to treat blood clots that may have been found in the veins of your legs (deep vein thrombosis) or in your lungs (pulmonary embolism) and to reduce the risk of them occurring again.  What do You need to know about Eliquis ? The starting dose is 10 mg (two 5 mg tablets) taken TWICE daily for the FIRST SEVEN (7) DAYS, then on FRIDAY 7/26  the dose is reduced to ONE 5 mg tablet taken TWICE daily.  Eliquis may be taken with or without food.   Try to take the dose about the same time in the morning and in the evening. If you have difficulty swallowing the tablet whole please discuss with your pharmacist how to take the medication safely.  Take Eliquis exactly as prescribed and DO NOT stop taking Eliquis without talking to the doctor who prescribed the medication.  Stopping may increase your risk of developing a new blood clot.  Refill your prescription before you run out.  After discharge, you should have  regular check-up appointments with your healthcare provider that is prescribing your Eliquis.    What do you do if you miss a dose? If a dose of ELIQUIS is not taken at the scheduled time, take it as soon as possible on the same day and twice-daily administration should be resumed. The dose should not be doubled to make up for a missed dose.  Important Safety Information A possible side effect of Eliquis is bleeding. You should call your healthcare provider right away if you experience any of the following: Bleeding from an injury or your nose that does not stop. Unusual colored urine (red or dark brown) or unusual colored stools (red or black). Unusual bruising for unknown reasons. A serious fall or if you hit your head (even if there is no bleeding).  Some medicines may interact with Eliquis and might increase your risk of bleeding or clotting while on Eliquis. To help avoid this, consult your healthcare provider or pharmacist prior to using any new prescription or non-prescription medications, including herbals, vitamins, non-steroidal anti-inflammatory drugs (NSAIDs) and supplements.  This website has more information on Eliquis (apixaban): http://www.eliquis.com/eliquis/home

## 2022-09-12 NOTE — ED Provider Notes (Signed)
[place] [facility] Provider Note   CSN: [id number] Arrival date & time: 09/12/22  1223     History  Chief Complaint  Patient presents with   Leg Pain    Brandon Joyce is a 72 y.o. male.   Leg Pain  Patient is a 72 year old male with past medical history significant for HIV undetectable viral load/normal CD4, HTN, CKD versus AKI seems to have normal labs/creatinine recently, CHF, asthma  Patient is present emergency room today with complaints of 1 week of left calf pain seemingly on the left shin/lateral calf.  No fevers at home.  No trauma to his leg.  No cough chest pain or difficulty breathing. Denies any hemoptysis.  No recent surgeries, hospitalization, long travel, hemoptysis, estrogen containing OCP, cancer history.  No unilateral leg swelling.  No history of PE or VTE.      Home Medications Prior to Admission medications   Medication Sig Start Date End Date Taking? Authorizing Provider  albuterol (VENTOLIN HFA) 108 (90 Base) MCG/ACT inhaler Inhale 2 puffs into the lungs every 6 (six) hours as needed for wheezing or shortness of breath. 03/04/22   Ginnie Smart, MD  amLODipine (NORVASC) 10 MG tablet Take 1 tablet (10 mg total) by mouth daily. 03/26/22 03/21/23  Ginnie Smart, MD  atorvastatin (LIPITOR) 20 MG tablet Take 1 tablet by mouth once daily 12/14/20   Lewayne Bunting, MD  azelastine (ASTELIN) 0.1 % nasal spray Place 1 spray into both nostrils daily. Use in each nostril as directed 07/17/22   Charlott Holler, MD  BIKTARVY 50-200-25 MG TABS tablet Take 1 tablet by mouth daily. 01/28/22   Ginnie Smart, MD  fluticasone (FLONASE) 50 MCG/ACT nasal spray Place 1 spray into both nostrils daily. 07/17/22 07/17/23  Charlott Holler, MD  fluticasone-salmeterol [facility] INHUB) 100-50 MCG/ACT AEPB Inhale 1 puff into the lungs 2 (two) times daily. 06/18/22   Ginnie Smart, MD  hydrALAZINE (APRESOLINE) 50 MG tablet Take 1 tablet  (50 mg total) by mouth 3 (three) times daily. 03/24/22   Lewayne Bunting, MD  oxymetazoline (AFRIN) 0.05 % nasal spray Place 1 spray into both nostrils Once PRN for congestion.    [provider]  spironolactone (ALDACTONE) 25 MG tablet Take 0.5 tablets (12.5 mg total) by mouth daily. Patient not taking: Reported on 07/17/2022 05/09/22 08/07/22  Jodelle Gross, NP      Allergies    Other; Sulfa antibiotics; Sulfacetamide sodium; Sulfasalazine; Atenolol; Isosorbide; Nitrates, organic; and Pentaerythritol tetranitrate    Review of Systems   Review of Systems  Physical Exam Updated Vital Signs BP 103/83 (BP Location: Right Arm)   Pulse 80   Temp 98 F (36.7 C) (Oral)   Resp 16   Ht 5\' 8"  (1.727 m)   Wt 96.2 kg   SpO2 95%   BMI 32.23 kg/m  Physical Exam Vitals and nursing note reviewed.  Constitutional:      General: He is not in acute distress. HENT:     Head: Normocephalic and atraumatic.     Nose: Nose normal.     Mouth/Throat:     Mouth: Mucous membranes are moist.  Eyes:     General: No scleral icterus. Cardiovascular:     Rate and Rhythm: Normal rate and regular rhythm.     Pulses: Normal pulses.     Heart sounds: Normal heart sounds.  Pulmonary:     Effort: Pulmonary effort is normal. No  respiratory distress.     Breath sounds: No wheezing.  Abdominal:     Palpations: Abdomen is soft.     Tenderness: There is no abdominal tenderness.  Musculoskeletal:     Cervical back: Normal range of motion.     Right lower leg: No edema.     Left lower leg: No edema.     Comments: Legs appear grossly symmetric. DP, PT pulses 3+ and symmetric  Skin:    General: Skin is warm and dry.     Capillary Refill: Capillary refill takes less than 2 seconds.  Neurological:     Mental Status: He is alert. Mental status is at baseline.  Psychiatric:        Mood and Affect: Mood normal.        Behavior: Behavior normal.     ED Results / Procedures / Treatments    Labs (all labs ordered are listed, but only abnormal results are displayed) Labs Reviewed - No data to display  EKG None  Radiology US Venous Img Lower Unilateral Left  Result Date: 09/12/2022 CLINICAL DATA:  One-week history of left lateral calf pain and swelling EXAM: LEFT LOWER EXTREMITY VENOUS DOPPLER ULTRASOUND TECHNIQUE: Gray-scale sonography with compression, as well as color and duplex ultrasound, were performed to evaluate the deep venous system(s) from the level of the common femoral vein through the popliteal and proximal calf veins. COMPARISON:  None Available. FINDINGS: VENOUS Normal compressibility of the common femoral, superficial femoral and popliteal veins. Visualized portions of profunda femoral vein and great saphenous vein unremarkable. Doppler waveforms show normal direction of venous flow, normal respiratory plasticity and response to augmentation. Noncompressible anterior tibial vein with short-segment occlusive thrombus underlying the area of mid calf pain. Limited views of the contralateral common femoral vein are unremarkable. OTHER Irregular, avascular hypoechoic focus within the left medial popliteal fossa measures 2.5 x 1.6 x 1.2 cm. Limitations: none IMPRESSION: 1. Short-segment occlusive thrombus within the left anterior tibial vein underlying the area of mid calf pain. 2. Irregular, avascular hypoechoic focus within the left medial popliteal fossa measures 2.5 x 1.6 x 1.2 cm, likely a Baker's cyst. Critical Value/emergent results were called by telephone at the time of interpretation on 09/12/2022 at 3:16 pm to provider HAYLEY NAASZ , who verbally acknowledged these results. Electronically Signed   By: Agustin Cree M.D.   On: 09/12/2022 15:16    Procedures Procedures    Medications Ordered in ED Medications - No data to display  ED Course/ Medical Decision Making/ A&P Clinical Course as of 09/12/22 1531  Fri Sep 12, 2022  1447 Pt is not in room -- is in [place] --  awaiting initial evaluation. [WF]    Clinical Course User Index [WF] Gailen Shelter, [place]                             Medical Decision Making  Patient is a 72 year old male with past medical history significant for HIV undetectable viral load/normal CD4, HTN, CKD versus AKI seems to have normal labs/creatinine recently, CHF, asthma  Patient is present emergency room today with complaints of 1 week of left calf pain seemingly on the left shin/lateral calf.  No fevers at home.  No trauma to his leg.  No cough chest pain or difficulty breathing. Denies any hemoptysis.  No recent surgeries, hospitalization, long travel, hemoptysis, estrogen containing OCP, cancer history.  No unilateral leg swelling.  No history of  PE or VTE.   Physical exam relatively unremarkable.  Patient does have DP PT pulses. Normal vital signs  Lower extremity ultrasound with  +DVT  IMPRESSION:  1. Short-segment occlusive thrombus within the left anterior tibial  vein underlying the area of mid calf pain.  2. Irregular, avascular hypoechoic focus within the left medial  popliteal fossa measures 2.5 x 1.6 x 1.2 cm, likely a Baker's cyst.    Critical Value/emergent results were called by telephone at the time  of interpretation on 09/12/2022 at 3:16 pm to provider HAYLEY NAASZ ,  who verbally acknowledged these results.   I discussed this case with my supervising physician Dr. Suezanne Jacquet who agrees with my plan.  Will discharge patient home at this time.  His most recent labs were drawn 1 month ago which shows creatinine of 1.12.  Patient started on Eliquis.  Given first dose here in the emergency room will follow-up with vein and vascular surgery.  Final Clinical Impression(s) / ED Diagnoses Final diagnoses:  Acute deep vein thrombosis (DVT) of other specified vein of left lower extremity [facility])    Rx / DC Orders ED Discharge Orders     None         Gailen Shelter, [place] 09/12/22 1612    Lonell Grandchild, MD 09/12/22 1954

## 2022-09-16 ENCOUNTER — Encounter: Payer: Self-pay | Admitting: Infectious Diseases

## 2022-09-16 ENCOUNTER — Ambulatory Visit (INDEPENDENT_AMBULATORY_CARE_PROVIDER_SITE_OTHER): Payer: Medicare HMO | Admitting: Infectious Diseases

## 2022-09-16 VITALS — BP 109/64 | HR 62 | Temp 98.1°F | Ht 70.0 in | Wt 215.0 lb

## 2022-09-16 DIAGNOSIS — I82409 Acute embolism and thrombosis of unspecified deep veins of unspecified lower extremity: Secondary | ICD-10-CM | POA: Insufficient documentation

## 2022-09-16 DIAGNOSIS — B20 Human immunodeficiency virus [HIV] disease: Secondary | ICD-10-CM

## 2022-09-16 DIAGNOSIS — I82442 Acute embolism and thrombosis of left tibial vein: Secondary | ICD-10-CM

## 2022-09-16 NOTE — Progress Notes (Signed)
Subjective:    Patient ID: Brandon Joyce, male  DOB: December 26, 1950, 72 y.o.        MRN: [id number]   HPI 72 yo M with HIV+, statin related myalgias, hyperlipidemia, erectile dysfunction, hypertension, ETOH use.  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 He has thoracic aortic aneurysm as well, repeat CTA 01-2022.  Had another work related injury fall 2018, C6 pinched nerve.  He had a nerve block which helped for ~ 48h. He had c-spine surgery 10-23-17.  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    Got penoplasty 07-2017 for ED.  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    Wife is WC bound, prev bed-sore resolved. Has home health nurse helping. Never smoker, wife smokes. She is becoming less mobile (feels like she is a burden).  Wife had lipoma removed from her neck last week.  Has concerns about his parents- Mother with continence issues. She fell and broke her nose this week.  Has help from his sister. Dad deceased 05-Sep-2021.     Was in hospital in [facility] 12-2021 for 2 nights, for asthma,  was given steroid taper as well as albuterol inh. He had CT chest (-).  Had ? Asthma attack. His RVP was (-). TTE 65-70% Grade 2 diastolic dysfunction.    Also followed by [facility] previously, at [facility].   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  He was started on eliquis, took day and half to get his rx.  Still feels like he has a "tooth ache in his L calf still", occas has numbness, cold feeling in his calf. Swelling has improved.  No nose or gum bleeds, no BRBPR.   HIV 1 RNA Quant  Date Value  05/30/2021 NOT DETECTED copies/mL  03/08/2021 Not Detected Copies/mL  05/21/2020 Not Detected Copies/mL   HIV-1 RNA Viral Load (copies/mL)  Date Value  03/04/2022 <20   CD4 T Cell Abs (/uL)  Date Value  05/30/2021 612  05/21/2020 627  08/25/2019 530     Health Maintenance  Topic Date Due  . Medicare  Annual Wellness (AWV)  Never done  . COVID-19 Vaccine (1) Never done  . Zoster Vaccines- Shingrix (1 of 2) Never done  . Colonoscopy  02/27/2019  . INFLUENZA VACCINE  09/25/2022  . DTaP/Tdap/Td (2 - Td or Tdap) 12/30/2026  . Pneumonia Vaccine 58+ Years old  Completed  . Hepatitis C Screening  Completed  . HPV VACCINES  Aged Out      Review of Systems  Constitutional:  Negative for chills and fever.  HENT:  Negative for nosebleeds.   Cardiovascular:  Positive for leg swelling.  Gastrointestinal:  Negative for blood in stool.  Genitourinary:  Negative for hematuria.  Musculoskeletal:  Positive for myalgias.    Please see HPI. All other systems reviewed and negative.     Objective:  Physical Exam Vitals reviewed.  Constitutional:      Appearance: Normal appearance.  HENT:     Mouth/Throat:     Mouth: Mucous membranes are moist.     Pharynx: No oropharyngeal exudate.  Eyes:     Extraocular Movements: Extraocular movements intact.     Pupils: Pupils are equal, round, and reactive to light.  Cardiovascular:     Rate and Rhythm: Normal rate and regular rhythm.  Pulmonary:     Effort: Pulmonary effort is normal.  Breath sounds: Normal breath sounds.  Abdominal:     General: Bowel sounds are normal. There is no distension.     Palpations: Abdomen is soft.     Tenderness: There is no abdominal tenderness.  Musculoskeletal:        General: Swelling and tenderness (,mild on L. cordis visible) present.     Cervical back: Normal range of motion and neck supple.     Left lower leg: Edema present.  Neurological:     Mental Status: He is alert.           Assessment & Plan:

## 2022-09-16 NOTE — Addendum Note (Signed)
Addended by: Jiovanna Frei C on: 09/16/2022 03:12 PM   Modules accepted: Orders

## 2022-09-16 NOTE — Assessment & Plan Note (Signed)
He has been on eliquis for 3 days, still has pain and swelling. I would expect this.  Will also send him to [facility].  Explained to pt and he agrees with plan.  Will see him back in 1 month.

## 2022-09-16 NOTE — Assessment & Plan Note (Signed)
Doing well  No change in ART

## 2022-09-17 ENCOUNTER — Ambulatory Visit: Payer: Medicare HMO | Admitting: Cardiology

## 2022-09-19 ENCOUNTER — Telehealth: Payer: Medicare HMO

## 2022-09-22 ENCOUNTER — Encounter (HOSPITAL_BASED_OUTPATIENT_CLINIC_OR_DEPARTMENT_OTHER): Payer: Self-pay | Admitting: Urology

## 2022-09-22 ENCOUNTER — Emergency Department (HOSPITAL_BASED_OUTPATIENT_CLINIC_OR_DEPARTMENT_OTHER)
Admission: EM | Admit: 2022-09-22 | Discharge: 2022-09-22 | Disposition: A | Discharge status: Home / Self Care | Payer: Medicare HMO | Attending: Emergency Medicine | Admitting: Emergency Medicine

## 2022-09-22 DIAGNOSIS — Z21 Asymptomatic human immunodeficiency virus [HIV] infection status: Secondary | ICD-10-CM | POA: Insufficient documentation

## 2022-09-22 DIAGNOSIS — I2510 Atherosclerotic heart disease of native coronary artery without angina pectoris: Secondary | ICD-10-CM | POA: Diagnosis not present

## 2022-09-22 DIAGNOSIS — Z7901 Long term (current) use of anticoagulants: Secondary | ICD-10-CM | POA: Diagnosis not present

## 2022-09-22 DIAGNOSIS — M79605 Pain in left leg: Secondary | ICD-10-CM

## 2022-09-22 DIAGNOSIS — I82402 Acute embolism and thrombosis of unspecified deep veins of left lower extremity: Secondary | ICD-10-CM | POA: Diagnosis not present

## 2022-09-22 DIAGNOSIS — M79662 Pain in left lower leg: Secondary | ICD-10-CM | POA: Diagnosis not present

## 2022-09-22 DIAGNOSIS — I824Y2 Acute embolism and thrombosis of unspecified deep veins of left proximal lower extremity: Secondary | ICD-10-CM | POA: Insufficient documentation

## 2022-09-22 MED ORDER — OXYCODONE HCL 5 MG PO TABS: 5.0000 mg | ORAL_TABLET | ORAL | 0 refills | Status: AC | PRN

## 2022-09-22 NOTE — ED Notes (Signed)
Pt went home without AVS and did not check out in registration. Provider did speak with patient prior to DC

## 2022-09-22 NOTE — Discharge Instructions (Signed)
You were seen for your leg pain in the emergency department.   At home, please take Tylenol for your pain.  You may take the oxycodone for any breakthrough pain that you have but do not take this with alcohol or while driving since it can cause drowsiness.  Please also use compression stockings every day for your legs which may help with your pain.    Check your MyChart online for the results of any tests that had not resulted by the time you left the emergency department.   Follow-up with your primary doctor in 2-3 days regarding your visit.  Follow-up with vascular surgery as soon as possible.  Return immediately to the emergency department if you experience any of the following: Chest pain, shortness of breath, or worsening pain, or any other concerning symptoms.    Thank you for visiting our Emergency Department. It was a pleasure taking care of you today.

## 2022-09-22 NOTE — ED Triage Notes (Signed)
Pt seen on 7/19 for DVT in left leg and started on blood thinners, states pain still continues, not any better

## 2022-09-22 NOTE — ED Provider Notes (Signed)
[place] [facility] Provider Note   CSN: [id number] Arrival date & time: 09/22/22  1052     History  Chief Complaint  Patient presents with   Leg Pain    Brandon Joyce is a 72 y.o. male.  72 year old male with history of alcoholism, CAD, HIV on ART, and DVT diagnosed on 09/12/2022 presents emergency department with persistent left lower extremity pain.  Patient reports that he started having left lower extremity pain and then went to the emergency department and was diagnosed with DVT in his left leg on 09/12/2022.  Says he is not able to start taking anticoagulation until 1 week ago exactly.  Has been compliant with his Eliquis.  Says that the pain has persisted and is not improving so he wanted to come back to the emergency department for evaluation.  No worsening pain or swelling.  No chest pain or shortness of breath.         Home Medications Prior to Admission medications   Medication Sig Start Date End Date Taking? Authorizing Provider  oxyCODONE (ROXICODONE) 5 MG immediate release tablet Take 1 tablet (5 mg total) by mouth every 4 (four) hours as needed for severe pain. 09/22/22  Yes Rondel Baton, MD  albuterol (VENTOLIN HFA) 108 (90 Base) MCG/ACT inhaler Inhale 2 puffs into the lungs every 6 (six) hours as needed for wheezing or shortness of breath. 03/04/22   Ginnie Smart, MD  amLODipine (NORVASC) 10 MG tablet Take 1 tablet (10 mg total) by mouth daily. 03/26/22 03/21/23  Ginnie Smart, MD  APIXABAN Everlene Balls) VTE STARTER PACK (10MG  AND 5MG ) Take as directed on package: start with two-5mg  tablets twice daily for 7 days. On day 8, switch to one-5mg  tablet twice daily. 09/12/22   Gailen Shelter, PA  atorvastatin (LIPITOR) 20 MG tablet Take 1 tablet by mouth once daily 12/14/20   Lewayne Bunting, MD  azelastine (ASTELIN) 0.1 % nasal spray Place 1 spray into both nostrils daily. Use in each nostril as directed 07/17/22   Charlott Holler, MD  BIKTARVY 50-200-25 MG TABS tablet Take 1 tablet by mouth daily. 01/28/22   Ginnie Smart, MD  fluticasone (FLONASE) 50 MCG/ACT nasal spray Place 1 spray into both nostrils daily. 07/17/22 07/17/23  Charlott Holler, MD  fluticasone-salmeterol [facility] INHUB) 100-50 MCG/ACT AEPB Inhale 1 puff into the lungs 2 (two) times daily. 06/18/22   Ginnie Smart, MD  hydrALAZINE (APRESOLINE) 50 MG tablet Take 1 tablet (50 mg total) by mouth 3 (three) times daily. 03/24/22   Lewayne Bunting, MD  oxyCODONE-acetaminophen (PERCOCET/ROXICET) 5-325 MG tablet Take 1 tablet by mouth every 8 (eight) hours as needed for severe pain. 09/12/22   Gailen Shelter, PA  oxymetazoline (AFRIN) 0.05 % nasal spray Place 1 spray into both nostrils Once PRN for congestion.    [provider]  spironolactone (ALDACTONE) 25 MG tablet Take 0.5 tablets (12.5 mg total) by mouth daily. Patient not taking: Reported on 07/17/2022 05/09/22 08/07/22  Jodelle Gross, NP      Allergies    Other; Sulfa antibiotics; Sulfacetamide sodium; Sulfasalazine; Atenolol; Isosorbide; Nitrates, organic; and Pentaerythritol tetranitrate    Review of Systems   Review of Systems  Physical Exam Updated Vital Signs BP 135/83   Pulse 87   Temp 98.5 F (36.9 C) (Oral)   Resp 18   Ht 5\' 10"  (1.778 m)   Wt 97.5 kg   SpO2 97%  BMI 30.84 kg/m  Physical Exam Vitals and nursing note reviewed.  Constitutional:      General: He is not in acute distress.    Appearance: He is well-developed.  HENT:     Head: Normocephalic and atraumatic.     Right Ear: External ear normal.     Left Ear: External ear normal.     Nose: Nose normal.  Eyes:     Extraocular Movements: Extraocular movements intact.     Conjunctiva/sclera: Conjunctivae normal.     Pupils: Pupils are equal, round, and reactive to light.  Cardiovascular:     Heart sounds: Normal heart sounds.  Pulmonary:     Effort: Pulmonary effort is normal. No  respiratory distress.  Musculoskeletal:     Cervical back: Normal range of motion and neck supple.     Right lower leg: No edema.     Left lower leg: No edema.     Comments: DP pulses 2+ bilaterally.  Compartments of the left lower extremity are soft.  Left lower extremity appears warm well-perfused.  No rashes or skin changes noted.  Full range of motion of left knee and left ankle.  No joint effusions noted.  Skin:    General: Skin is warm and dry.  Neurological:     Mental Status: He is alert. Mental status is at baseline.  Psychiatric:        Mood and Affect: Mood normal.        Behavior: Behavior normal.     ED Results / Procedures / Treatments   Labs (all labs ordered are listed, but only abnormal results are displayed) Labs Reviewed - No data to display  EKG None  Radiology No results found.  Procedures Procedures    Medications Ordered in ED Medications - No data to display  ED Course/ Medical Decision Making/ A&P                             Medical Decision Making Risk Prescription drug management.   [name] is a 72 y.o. male with comorbidities that complicate the patient evaluation including alcoholism, CAD, HIV on ART, and DVT diagnosed on 09/12/2022 presents emergency department with persistent left lower extremity pain.   Initial Ddx:  DVT, phlegmasia cerulea dolens, compartment syndrome, arterial insufficiency  MDM/Course:  Patient presents emergency department with left lower extremity pain in the setting of a recently diagnosed DVT in that leg.  Has been compliant with his Eliquis.  On exam has good pulses.  No signs of phlegmasia at this time.  No worsening of his pain or other concerning findings at this time and I suspect that he may just have persistent pain from his DVT.  Not have any worsening symptoms so do not feel that repeat imaging is warranted at this time.  Upon re-evaluation patient remained stable.  Will have him continue his  Eliquis, use compression stockings, and give him a short course of oxycodone for any breakthrough pain.  Will him follow-up with his primary doctor and vascular surgery to make sure that his pain is improving.  This patient presents to the ED for concern of complaints listed in HPI, this involves an extensive number of treatment options, and is a complaint that carries with it a high risk of complications and morbidity. Disposition including potential need for admission considered.   Dispo: DC Home. Return precautions discussed including, but not limited to, those listed in the AVS.  Allowed pt time to ask questions which were answered fully prior to dc.  Records reviewed ED Visit Notes I have reviewed the patients home medications and made adjustments as needed Social Determinants of health:  Elderly       Final Clinical Impression(s) / ED Diagnoses Final diagnoses:  Left leg pain  Acute deep vein thrombosis (DVT) of proximal vein of left lower extremity (HCC)    Rx / DC Orders ED Discharge Orders          Ordered    oxyCODONE (ROXICODONE) 5 MG immediate release tablet  Every 4 hours PRN        09/22/22 1124              Rondel Baton, MD 09/22/22 1344

## 2022-09-24 ENCOUNTER — Ambulatory Visit: Payer: Medicare HMO | Admitting: Internal Medicine

## 2022-10-01 ENCOUNTER — Ambulatory Visit (INDEPENDENT_AMBULATORY_CARE_PROVIDER_SITE_OTHER): Payer: Medicare HMO

## 2022-10-01 VITALS — Ht 68.0 in | Wt 214.0 lb

## 2022-10-01 DIAGNOSIS — Z0000 Encounter for general adult medical examination without abnormal findings: Secondary | ICD-10-CM

## 2022-10-01 NOTE — Patient Instructions (Signed)
Brandon Joyce , Thank you for taking time to come for your Medicare Wellness Visit. I appreciate your ongoing commitment to your health goals. Please review the following plan we discussed and let me know if I can assist you in the future.   Referrals/Orders/Follow-Ups/Clinician Recommendations: You are over due to have a colonoscopy.  I have informed your PCP.  You are also due for your Shingles vaccines and soon for the Flu vaccines.  You can have these done at your local pharmacy.  I wish you well with your health.    This is a list of the screening recommended for you and due dates:  Health Maintenance  Topic Date Due   Colon Cancer Screening  02/27/2019   Zoster (Shingles) Vaccine (1 of 2) 01/23/2023*   Flu Shot  05/25/2023*   Medicare Annual Wellness Visit  10/01/2023   DTaP/Tdap/Td vaccine (2 - Td or Tdap) 12/30/2026   Pneumonia Vaccine  Completed   Hepatitis C Screening  Completed   HPV Vaccine  Aged Out   COVID-19 Vaccine  Discontinued  *Topic was postponed. The date shown is not the original due date.    Advanced directives: (Declined) Advance directive discussed with you today. Even though you declined this today, please call our office should you change your mind, and we can give you the proper paperwork for you to fill out.  Next Medicare Annual Wellness Visit scheduled for next year: No  Preventive Care 65 Years and Older, Male  Preventive care refers to lifestyle choices and visits with your health care provider that can promote health and wellness. What does preventive care include? A yearly physical exam. This is also called an annual well check. Dental exams once or twice a year. Routine eye exams. Ask your health care provider how often you should have your eyes checked. Personal lifestyle choices, including: Daily care of your teeth and gums. Regular physical activity. Eating a healthy diet. Avoiding tobacco and drug use. Limiting alcohol use. Practicing safe  sex. Taking low doses of aspirin every day. Taking vitamin and mineral supplements as recommended by your health care provider. What happens during an annual well check? The services and screenings done by your health care provider during your annual well check will depend on your age, overall health, lifestyle risk factors, and family history of disease. Counseling  Your health care provider may ask you questions about your: Alcohol use. Tobacco use. Drug use. Emotional well-being. Home and relationship well-being. Sexual activity. Eating habits. History of falls. Memory and ability to understand (cognition). Work and work Astronomer. Screening  You may have the following tests or measurements: Height, weight, and BMI. Blood pressure. Lipid and cholesterol levels. These may be checked every 5 years, or more frequently if you are over 41 years old. Skin check. Lung cancer screening. You may have this screening every year starting at age 88 if you have a 30-pack-year history of smoking and currently smoke or have quit within the past 15 years. Fecal occult blood test (FOBT) of the stool. You may have this test every year starting at age 85. Flexible sigmoidoscopy or colonoscopy. You may have a sigmoidoscopy every 5 years or a colonoscopy every 10 years starting at age 54. Prostate cancer screening. Recommendations will vary depending on your family history and other risks. Hepatitis C blood test. Hepatitis B blood test. Sexually transmitted disease (STD) testing. Diabetes screening. This is done by checking your blood sugar (glucose) after you have not eaten for a  while (fasting). You may have this done every 1-3 years. Abdominal aortic aneurysm (AAA) screening. You may need this if you are a current or former smoker. Osteoporosis. You may be screened starting at age 33 if you are at high risk. Talk with your health care provider about your test results, treatment options, and if  necessary, the need for more tests. Vaccines  Your health care provider may recommend certain vaccines, such as: Influenza vaccine. This is recommended every year. Tetanus, diphtheria, and acellular pertussis (Tdap, Td) vaccine. You may need a Td booster every 10 years. Zoster vaccine. You may need this after age 53. Pneumococcal 13-valent conjugate (PCV13) vaccine. One dose is recommended after age 27. Pneumococcal polysaccharide (PPSV23) vaccine. One dose is recommended after age 45. Talk to your health care provider about which screenings and vaccines you need and how often you need them. This information is not intended to replace advice given to you by your health care provider. Make sure you discuss any questions you have with your health care provider. Document Released: 03/09/2015 Document Revised: 10/31/2015 Document Reviewed: 12/12/2014 Elsevier Interactive Patient Education  2017 [employer].  Fall Prevention in the Home Falls can cause injuries. They can happen to people of all ages. There are many things you can do to make your home safe and to help prevent falls. What can I do on the outside of my home? Regularly fix the edges of walkways and driveways and fix any cracks. Remove anything that might make you trip as you walk through a door, such as a raised step or threshold. Trim any bushes or trees on the path to your home. Use bright outdoor lighting. Clear any walking paths of anything that might make someone trip, such as rocks or tools. Regularly check to see if handrails are loose or broken. Make sure that both sides of any steps have handrails. Any raised decks and porches should have guardrails on the edges. Have any leaves, snow, or ice cleared regularly. Use sand or salt on walking paths during winter. Clean up any spills in your garage right away. This includes oil or grease spills. What can I do in the bathroom? Use night lights. Install grab bars by the toilet  and in the tub and shower. Do not use towel bars as grab bars. Use non-skid mats or decals in the tub or shower. If you need to sit down in the shower, use a plastic, non-slip stool. Keep the floor dry. Clean up any water that spills on the floor as soon as it happens. Remove soap buildup in the tub or shower regularly. Attach bath mats securely with double-sided non-slip rug tape. Do not have throw rugs and other things on the floor that can make you trip. What can I do in the bedroom? Use night lights. Make sure that you have a light by your bed that is easy to reach. Do not use any sheets or blankets that are too big for your bed. They should not hang down onto the floor. Have a firm chair that has side arms. You can use this for support while you get dressed. Do not have throw rugs and other things on the floor that can make you trip. What can I do in the kitchen? Clean up any spills right away. Avoid walking on wet floors. Keep items that you use a lot in easy-to-reach places. If you need to reach something above you, use a strong step stool that has a grab  bar. Keep electrical cords out of the way. Do not use floor polish or wax that makes floors slippery. If you must use wax, use non-skid floor wax. Do not have throw rugs and other things on the floor that can make you trip. What can I do with my stairs? Do not leave any items on the stairs. Make sure that there are handrails on both sides of the stairs and use them. Fix handrails that are broken or loose. Make sure that handrails are as long as the stairways. Check any carpeting to make sure that it is firmly attached to the stairs. Fix any carpet that is loose or worn. Avoid having throw rugs at the top or bottom of the stairs. If you do have throw rugs, attach them to the floor with carpet tape. Make sure that you have a light switch at the top of the stairs and the bottom of the stairs. If you do not have them, ask someone to add  them for you. What else can I do to help prevent falls? Wear shoes that: Do not have high heels. Have rubber bottoms. Are comfortable and fit you well. Are closed at the toe. Do not wear sandals. If you use a stepladder: Make sure that it is fully opened. Do not climb a closed stepladder. Make sure that both sides of the stepladder are locked into place. Ask someone to hold it for you, if possible. Clearly mark and make sure that you can see: Any grab bars or handrails. First and last steps. Where the edge of each step is. Use tools that help you move around (mobility aids) if they are needed. These include: Canes. Walkers. Scooters. Crutches. Turn on the lights when you go into a dark area. Replace any light bulbs as soon as they burn out. Set up your furniture so you have a clear path. Avoid moving your furniture around. If any of your floors are uneven, fix them. If there are any pets around you, be aware of where they are. Review your medicines with your doctor. Some medicines can make you feel dizzy. This can increase your chance of falling. Ask your doctor what other things that you can do to help prevent falls. This information is not intended to replace advice given to you by your health care provider. Make sure you discuss any questions you have with your health care provider. Document Released: 12/07/2008 Document Revised: 07/19/2015 Document Reviewed: 03/17/2014 Elsevier Interactive Patient Education  2017 [employer] Managing Pain Without Opioids Opioids are strong medicines used to treat moderate to severe pain. For some people, especially those who have long-term (chronic) pain, opioids may not be the best choice for pain management due to: Side effects like nausea, constipation, and sleepiness. The risk of addiction (opioid use disorder). The longer you take opioids, the greater your risk of addiction. Pain that lasts for more than 3 months is called chronic pain.  Managing chronic pain usually requires more than one approach and is often provided by a team of health care providers working together (multidisciplinary approach). Pain management may be done at a pain management center or pain clinic. How to manage pain without the use of opioids Use non-opioid medicines Non-opioid medicines for pain may include: Over-the-counter or prescription non-steroidal anti-inflammatory drugs (NSAIDs). These may be the first medicines used for pain. They work well for muscle and bone pain, and they reduce swelling. Acetaminophen. This over-the-counter medicine may work well for milder pain but not swelling.  Antidepressants. These may be used to treat chronic pain. A certain type of antidepressant (tricyclics) is often used. These medicines are given in lower doses for pain than when used for depression. Anticonvulsants. These are usually used to treat seizures but may also reduce nerve (neuropathic) pain. Muscle relaxants. These relieve pain caused by sudden muscle tightening (spasms). You may also use a pain medicine that is applied to the skin as a patch, cream, or gel (topical analgesic), such as a numbing medicine. These may cause fewer side effects than medicines taken by mouth. Do certain therapies as directed Some therapies can help with pain management. They include: Physical therapy. You will do exercises to gain strength and flexibility. A physical therapist may teach you exercises to move and stretch parts of your body that are weak, stiff, or painful. You can learn these exercises at physical therapy visits and practice them at home. Physical therapy may also involve: Massage. Heat wraps or applying heat or cold to affected areas. Electrical signals that interrupt pain signals (transcutaneous electrical nerve stimulation, TENS). Weak lasers that reduce pain and swelling (low-level laser therapy). Signals from your body that help you learn to regulate pain  (biofeedback). Occupational therapy. This helps you to learn ways to function at home and work with less pain. Recreational therapy. This involves trying new activities or hobbies, such as a physical activity or drawing. Mental health therapy, including: Cognitive behavioral therapy (CBT). This helps you learn coping skills for dealing with pain. Acceptance and commitment therapy (ACT) to change the way you think and react to pain. Relaxation therapies, including muscle relaxation exercises and mindfulness-based stress reduction. Pain management counseling. This may be individual, family, or group counseling.  Receive medical treatments Medical treatments for pain management include: Nerve block injections. These may include a pain blocker and anti-inflammatory medicines. You may have injections: Near the spine to relieve chronic back or neck pain. Into joints to relieve back or joint pain. Into nerve areas that supply a painful area to relieve body pain. Into muscles (trigger point injections) to relieve some painful muscle conditions. A medical device placed near your spine to help block pain signals and relieve nerve pain or chronic back pain (spinal cord stimulation device). Acupuncture. Follow these instructions at home Medicines Take over-the-counter and prescription medicines only as told by your health care provider. If you are taking pain medicine, ask your health care providers about possible side effects to watch out for. Do not drive or use heavy machinery while taking prescription opioid pain medicine. Lifestyle  Do not use drugs or alcohol to reduce pain. If you drink alcohol, limit how much you have to: 0-1 drink a day for women who are not pregnant. 0-2 drinks a day for men. Know how much alcohol is in a drink. In the [place] one drink equals one 12 oz bottle of beer (355 mL), one 5 oz glass of wine (148 mL), or one 1 oz glass of hard liquor (44 mL). Do not use any  products that contain nicotine or tobacco. These products include cigarettes, chewing tobacco, and vaping devices, such as e-cigarettes. If you need help quitting, ask your health care provider. Eat a healthy diet and maintain a healthy weight. Poor diet and excess weight may make pain worse. Eat foods that are high in fiber. These include fresh fruits and vegetables, whole grains, and beans. Limit foods that are high in fat and processed sugars, such as fried and sweet foods. Exercise regularly. Exercise lowers  stress and may help relieve pain. Ask your health care provider what activities and exercises are safe for you. If your health care provider approves, join an exercise class that combines movement and stress reduction. Examples include yoga and tai chi. Get enough sleep. Lack of sleep may make pain worse. Lower stress as much as possible. Practice stress reduction techniques as told by your therapist. General instructions Work with all your pain management providers to find the treatments that work best for you. You are an important member of your pain management team. There are many things you can do to reduce pain on your own. Consider joining an online or in-person support group for people who have chronic pain. Keep all follow-up visits. This is important. Where to find more information You can find more information about managing pain without opioids from: American Academy of Pain Medicine: painmed.org Institute for Chronic Pain: instituteforchronicpain.org American Chronic Pain Association: theacpa.org Contact a health care provider if: You have side effects from pain medicine. Your pain gets worse or does not get better with treatments or home therapy. You are struggling with anxiety or depression. Summary Many types of pain can be managed without opioids. Chronic pain may respond better to pain management without opioids. Pain is best managed when you and a team of health care  providers work together. Pain management without opioids may include non-opioid medicines, medical treatments, physical therapy, mental health therapy, and lifestyle changes. Tell your health care providers if your pain gets worse or is not being managed well enough. This information is not intended to replace advice given to you by your health care provider. Make sure you discuss any questions you have with your health care provider. Document Revised: 05/23/2020 Document Reviewed: 05/23/2020 Elsevier Patient Education  2024 [employer].

## 2022-10-01 NOTE — Progress Notes (Signed)
Subjective:   Brandon Joyce is a 72 y.o. male who presents for Medicare Annual/Subsequent preventive examination.  Visit Complete: Virtual  I connected with  Brandon Joyce on 10/01/22 by a audio enabled telemedicine application and verified that I am speaking with the correct person using two identifiers.  Patient Location: Home  Provider Location: Home Office  I discussed the limitations of evaluation and management by telemedicine. The patient expressed understanding and agreed to proceed.  Vital Signs: Vital signs are patient reported.   Review of Systems    Cardiac Risk Factors include: advanced age (>27men, >61 women);male gender;hypertension;Other (see comment);dyslipidemia, Risk factor comments: CHF, CKD, DVT, HIV     Objective:    Today's Vitals   10/01/22 1520 10/01/22 1521  Weight: 214 lb (97.1 kg)   Height: 5\' 8"  (1.727 m)   PainSc:  10-Worst pain ever   Body mass index is 32.54 kg/m.     10/01/2022    3:30 PM 09/22/2022   10:58 AM 09/16/2022    2:11 PM 09/12/2022   12:41 PM 07/22/2022    2:39 PM 06/04/2022    2:37 PM 03/26/2022    1:16 PM  Advanced Directives  Does Patient Have a Medical Advance Directive? No No No No No No No  Would patient like information on creating a medical advance directive? No - Patient declined  No - Patient declined No - Patient declined No - Patient declined No - Patient declined No - Patient declined    Current Medications (verified) Outpatient Encounter Medications as of 10/01/2022  Medication Sig   albuterol (VENTOLIN HFA) 108 (90 Base) MCG/ACT inhaler Inhale 2 puffs into the lungs every 6 (six) hours as needed for wheezing or shortness of breath.   amLODipine (NORVASC) 10 MG tablet Take 1 tablet (10 mg total) by mouth daily.   APIXABAN (ELIQUIS) VTE STARTER PACK (10MG  AND 5MG ) Take as directed on package: start with two-5mg  tablets twice daily for 7 days. On day 8, switch to one-5mg  tablet twice daily.   atorvastatin (LIPITOR) 20 MG  tablet Take 1 tablet by mouth once daily   azelastine (ASTELIN) 0.1 % nasal spray Place 1 spray into both nostrils daily. Use in each nostril as directed   BIKTARVY 50-200-25 MG TABS tablet Take 1 tablet by mouth daily.   fluticasone (FLONASE) 50 MCG/ACT nasal spray Place 1 spray into both nostrils daily.   fluticasone-salmeterol (WIXELA INHUB) 100-50 MCG/ACT AEPB Inhale 1 puff into the lungs 2 (two) times daily.   hydrALAZINE (APRESOLINE) 50 MG tablet Take 1 tablet (50 mg total) by mouth 3 (three) times daily.   oxyCODONE (ROXICODONE) 5 MG immediate release tablet Take 1 tablet (5 mg total) by mouth every 4 (four) hours as needed for severe pain.   oxyCODONE-acetaminophen (PERCOCET/ROXICET) 5-325 MG tablet Take 1 tablet by mouth every 8 (eight) hours as needed for severe pain.   oxymetazoline (AFRIN) 0.05 % nasal spray Place 1 spray into both nostrils Once PRN for congestion.   spironolactone (ALDACTONE) 25 MG tablet Take 0.5 tablets (12.5 mg total) by mouth daily. (Patient not taking: Reported on 07/17/2022)   No facility-administered encounter medications on file as of 10/01/2022.    Allergies (verified) Other; Sulfa antibiotics; Sulfacetamide sodium; Sulfasalazine; Atenolol; Isosorbide; Nitrates, organic; and Pentaerythritol tetranitrate   History: Past Medical History:  Diagnosis Date   Alcoholism (HCC)    Chronic kidney disease    followed by nephro   ED (erectile dysfunction)    HIV disease (HCC)  HLD (hyperlipidemia)    HTN (hypertension)    Pneumonia    Strain of neck muscle 03/23/2017   Past Surgical History:  Procedure Laterality Date   BACK SURGERY     INCISION AND DRAINAGE ABSCESS N/A 12/07/2019   Procedure: INCISION AND DRAINAGE BUTTOCK ABSCESS;  Surgeon: Griselda Miner, MD;  Location: [facility];  Service: General;  Laterality: N/A;   LEFT HEART CATHETERIZATION WITH CORONARY ANGIOGRAM N/A 07/28/2013   Procedure: LEFT HEART CATHETERIZATION WITH CORONARY  ANGIOGRAM;  Surgeon: Corky Crafts, MD;  Location: [facility] CATH LAB;  Service: Cardiovascular;  Laterality: N/A;   MASS EXCISION Left 01/25/2020   Procedure: 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;  Surgeon: Griselda Miner, MD;  Location: [facility];  Service: General;  Laterality: Left;   ROTATOR CUFF REPAIR Right 04-27-2015   TONSILLECTOMY     Family History  Problem Relation Age of Onset   Diverticulitis Mother    Heart disease Mother    Atrial fibrillation Mother    Heart disease Father        CABG   Colon cancer Brother    Lung disease Neg Hx    Social History   Socioeconomic History   Marital status: Married    Spouse name: Helmut Muster   Number of children: 3   Years of education: Not on file   Highest education level: GED or equivalent  Occupational History    Employer: [employer]  Tobacco Use   Smoking status: Never   Smokeless tobacco: Never  Vaping Use   Vaping status: Never Used  Substance and Sexual Activity   Alcohol use: Yes    Alcohol/week: 21.0 standard drinks of alcohol    Types: 21 Standard drinks or equivalent per week    Comment: beer and whiskey - has increased   Drug use: No   Sexual activity: Not Currently    Partners: Female    Birth control/protection: Condom    Comment: accepted condoms  Other Topics Concern   Not on file  Social History Narrative   Not on file   Social Determinants of Health   Financial Resource Strain: Low Risk  (06/09/2022)   Overall Financial Resource Strain (CARDIA)    Difficulty of Paying Living Expenses: Not very hard  Recent Concern: Financial Resource Strain - Medium Risk (06/04/2022)   Overall Financial Resource Strain (CARDIA)    Difficulty of Paying Living Expenses: Somewhat hard  Food Insecurity: No Food Insecurity (10/01/2022)   Hunger Vital Sign    Worried About Running Out of Food in the Last Year: Never true    Ran Out of Food in the Last Year: Never true  Transportation Needs:  No Transportation Needs (10/01/2022)   PRAPARE - Administrator, Civil Service (Medical): No    Lack of Transportation (Non-Medical): No  Physical Activity: Inactive (10/01/2022)   Exercise Vital Sign    Days of Exercise per Week: 3 days    Minutes of Exercise per Session: 0 min  Stress: No Stress Concern Present (10/01/2022)   [employer] of Occupational Health - Occupational Stress Questionnaire    Feeling of Stress : Only a little  Social Connections: Moderately Isolated (10/01/2022)   Social Connection and Isolation Panel [NHANES]    Frequency of Communication with Friends and Family: Three times a week    Frequency of Social Gatherings with Friends and Family: Once a week    Attends Religious Services: Never  Active Member of Clubs or Organizations: No    Attends Banker Meetings: Never    Marital Status: Married    Tobacco Counseling Counseling given: Not Answered   Clinical Intake:  Pre-visit preparation completed: Yes  Pain : 0-10 Pain Score: 10-Worst pain ever Pain Type: Chronic pain Pain Location: Leg (blood clot in Lt leg) Pain Orientation: Left Pain Descriptors / Indicators: Constant Pain Onset: More than a month ago (DVT of lt leg) Pain Frequency: Constant Effect of Pain on Daily Activities: yes/walking     BMI - recorded: 32.54 Nutritional Status: BMI > 30  Obese Nutritional Risks: None Diabetes: No  How often do you need to have someone help you when you read instructions, pamphlets, or other written materials from your doctor or pharmacy?: 1 - Never  Interpreter Needed?: No  Information entered by ::  , RMA   Activities of Daily Living    10/01/2022    3:27 PM 09/16/2022    2:10 PM  In your present state of health, do you have any difficulty performing the following activities:  Hearing? 0 0  Vision? 0 0  Difficulty concentrating or making decisions? 0 1  Walking or climbing stairs? 1 1  Dressing or  bathing? 0 0  Doing errands, shopping? 0 0  Preparing Food and eating ? N   Using the Toilet? N   In the past six months, have you accidently leaked urine? N   Do you have problems with loss of bowel control? N   Managing your Medications? N   Managing your Finances? N   Housekeeping or managing your Housekeeping? N     Patient Care Team: Pcp, No as PCP - General [place] Lacretia Leigh, MD as PCP - Infectious Diseases (Infectious Diseases) Crenshaw, Madolyn Frieze, MD as PCP - Cardiology (Cardiology)  Indicate any recent Medical Services you may have received from other than [facility] providers in the past year (date may be approximate).     Assessment:   This is a routine wellness examination for Brandon Joyce.  Hearing/Vision screen Hearing Screening - Comments:: Denies hearing difficulties   Vision Screening - Comments:: Wears eyeglasses  Dietary issues and exercise activities discussed:     Goals Addressed   None   Depression Screen    10/01/2022    3:34 PM 09/16/2022    2:10 PM 06/09/2022    2:42 PM 06/04/2022    2:36 PM 06/04/2022    2:29 PM 03/26/2022    1:16 PM 03/04/2022    3:00 PM  PHQ 2/9 Scores  PHQ - 2 Score 1 0 0 0 0 0 0  PHQ- 9 Score 2          Fall Risk    10/01/2022    3:30 PM 09/16/2022    2:10 PM 07/22/2022    2:39 PM 06/04/2022    2:37 PM 03/26/2022    1:16 PM  Fall Risk   Falls in the past year? 0 0 0 0 1  Number falls in past yr: 0 0 0 0 0  Injury with Fall? 0 0 0 0 1  Risk for fall due to : No Fall Risks  No Fall Risks No Fall Risks Impaired balance/gait  Follow up Falls prevention discussed Falls evaluation completed;Falls prevention discussed Falls prevention discussed;Falls evaluation completed Falls prevention discussed;Falls evaluation completed Falls evaluation completed;Falls prevention discussed    MEDICARE RISK AT HOME:  Medicare Risk at Home - 10/01/22 1530     Any  stairs in or around the home? No    Home free of loose throw rugs in walkways, pet beds,  electrical cords, etc? Yes    Adequate lighting in your home to reduce risk of falls? Yes    Life alert? No    Use of a cane, walker or w/c? No    Grab bars in the bathroom? Yes    Shower chair or bench in shower? Yes    Elevated toilet seat or a handicapped toilet? Yes             TIMED UP AND GO:  Was the test performed?  No    Cognitive Function:        10/01/2022    3:31 PM  6CIT Screen  What Year? 0 points  What month? 0 points  What time? 0 points  Count back from 20 0 points  Months in reverse 4 points  Repeat phrase 6 points  Total Score 10 points    Immunizations Immunization History  Administered Date(s) Administered   Fluad Quad(high Dose 65+) 03/22/2021   Hepatitis A 12/11/2005, 03/19/2011   Hepatitis B 12/11/2005, 01/23/2006, 06/16/2006   Hepatitis B, ADULT 10/12/2013, 04/17/2014   Influenza Split 11/13/2010, 12/28/2011   Influenza Whole 11/24/2005, 12/29/2006, 11/09/2007, 12/26/2008   Influenza,inj,Quad PF,6+ Mos 11/24/2012, 10/12/2013, 11/27/2014, 02/09/2018, 12/09/2018, 01/28/2022   Influenza,inj,quad, With Preservative 01/08/2017   Influenza-Unspecified 12/06/2015   PPD Test 04/20/2006   Pneumococcal Conjugate-13 02/09/2018   Pneumococcal Polysaccharide-23 08/25/2005, 11/13/2010, 12/09/2018   Tdap 12/29/2016    TDAP status: Up to date  Flu Vaccine status: Up to date  Pneumococcal vaccine status: Up to date  Covid-19 vaccine status: Declined, Education has been provided regarding the importance of this vaccine but patient still declined. Advised may receive this vaccine at local pharmacy or Health Dept.or vaccine clinic. Aware to provide a copy of the vaccination record if obtained from local pharmacy or Health Dept. Verbalized acceptance and understanding.  Qualifies for Shingles Vaccine? Yes   Zostavax completed No   Shingrix Completed?: No.    Education has been provided regarding the importance of this vaccine. Patient has been advised  to call insurance company to determine out of pocket expense if they have not yet received this vaccine. Advised may also receive vaccine at local pharmacy or Health Dept. Verbalized acceptance and understanding.  Screening Tests Health Maintenance  Topic Date Due   COVID-19 Vaccine (1) Never done   Zoster Vaccines- Shingrix (1 of 2) Never done   Colonoscopy  02/27/2019   INFLUENZA VACCINE  09/25/2022   Medicare Annual Wellness (AWV)  10/01/2023   DTaP/Tdap/Td (2 - Td or Tdap) 12/30/2026   Pneumonia Vaccine 43+ Years old  Completed   Hepatitis C Screening  Completed   HPV VACCINES  Aged Out    Health Maintenance  Health Maintenance Due  Topic Date Due   COVID-19 Vaccine (1) Never done   Zoster Vaccines- Shingrix (1 of 2) Never done   Colonoscopy  02/27/2019   INFLUENZA VACCINE  09/25/2022    Colorectal cancer screening: Referral to GI placed sent to provider. Pt aware the office will call re: appt.  Lung Cancer Screening: (Low Dose CT Chest recommended if Age 84-80 years, 20 pack-year currently smoking OR have quit w/in 15years.) does not qualify.   Lung Cancer Screening Referral: N/A  Additional Screening:  Hepatitis C Screening: does qualify; Completed 04/20/2006  Vision Screening: Recommended annual ophthalmology exams for early detection of glaucoma and other disorders  of the eye. Is the patient up to date with their annual eye exam?  Yes   Who is the provider or what is the name of the office in which the patient attends annual eye exams? Patient could not remember name of Doctor. If pt is not established with a provider, would they like to be referred to a provider to establish care? No .   Dental Screening: Recommended annual dental exams for proper oral hygiene   Community Resource Referral / Chronic Care Management: CRR required this visit?  No   CCM required this visit?  No     Plan:     I have personally reviewed and noted the following in the patient's  chart:   Medical and social history Use of alcohol, tobacco or illicit drugs  Current medications and supplements including opioid prescriptions. Patient is currently taking opioid prescriptions. Information provided to patient regarding non-opioid alternatives. Patient advised to discuss non-opioid treatment plan with their provider. Functional ability and status Nutritional status Physical activity Advanced directives List of other physicians Hospitalizations, surgeries, and ER visits in previous 12 months Vitals Screenings to include cognitive, depression, and falls Referrals and appointments  In addition, I have reviewed and discussed with patient certain preventive protocols, quality metrics, and best practice recommendations. A written personalized care plan for preventive services as well as general preventive health recommendations were provided to patient.      L , CMA   10/01/2022   After Visit Summary: (MyChart) Due to this being a telephonic visit, the after visit summary with patients personalized plan was offered to patient via MyChart   Nurse Notes: Patient complains of pain from DVT of Lt leg today going on for at least 1 month.  He is due for Shingrix vaccine and stated he is thinking about it.  He also is due for a colonoscopy.  Patient stated he had an annual eye exam in March of this year but could not think of the name of doctor.

## 2022-10-07 ENCOUNTER — Ambulatory Visit: Payer: Medicare HMO | Admitting: Internal Medicine

## 2022-10-21 ENCOUNTER — Ambulatory Visit (INDEPENDENT_AMBULATORY_CARE_PROVIDER_SITE_OTHER): Payer: Medicare HMO | Admitting: Infectious Diseases

## 2022-10-21 VITALS — BP 135/77 | HR 68 | Temp 98.3°F | Resp 19 | Ht 68.0 in | Wt 214.6 lb

## 2022-10-21 DIAGNOSIS — I82442 Acute embolism and thrombosis of left tibial vein: Secondary | ICD-10-CM | POA: Diagnosis not present

## 2022-10-21 DIAGNOSIS — I10 Essential (primary) hypertension: Secondary | ICD-10-CM | POA: Diagnosis not present

## 2022-10-21 DIAGNOSIS — Z79899 Other long term (current) drug therapy: Secondary | ICD-10-CM | POA: Diagnosis not present

## 2022-10-21 DIAGNOSIS — Z23 Encounter for immunization: Secondary | ICD-10-CM

## 2022-10-21 DIAGNOSIS — B20 Human immunodeficiency virus [HIV] disease: Secondary | ICD-10-CM

## 2022-10-21 DIAGNOSIS — Z113 Encounter for screening for infections with a predominantly sexual mode of transmission: Secondary | ICD-10-CM

## 2022-10-21 MED ORDER — APIXABAN 5 MG PO TABS: 5.0000 mg | ORAL_TABLET | Freq: Two times a day (BID) | ORAL | 3 refills | Status: DC

## 2022-10-21 NOTE — Assessment & Plan Note (Signed)
Consulted with pharm- rec is for minimum 6 months.  I have refilled his rx and he has vasc f/u 11-04-22.

## 2022-10-21 NOTE — Progress Notes (Signed)
Subjective:    Patient ID: Brandon Joyce, male  DOB: 18-Sep-1950, 72 y.o.        MRN: [id number]   HPI 72 yo M with HIV+, statin related myalgias, hyperlipidemia, erectile dysfunction (penoplasty), hypertension, ETOH use.  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 He has thoracic aortic aneurysm as well, repeat CTA 01-2022.  Had another work related injury fall 2018, C6 pinched nerve.  He had a nerve block which helped for ~ 48h. He had c-spine surgery 10-23-17.  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    Wife is WC bound, prev bed-sore resolved. Has home health nurse helping. Never smoker, wife smokes. She is becoming less mobile (feels like she is a burden).  Has concerns about mother with continence issues. Prev falls.  Has help from his sister. Dad deceased 09/01/21.     Was in hospital in [facility] 12-2021 for 2 nights, for asthma, was given steroid taper as well as albuterol inh. He had CT chest (-).  Had ? Asthma attack. His RVP was (-). TTE 65-70% Grade 2 diastolic dysfunction.   Also followed by CV previously, at [facility].  Has CV f/u next month. No recent chest pain.    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  He was started on eliquis, took day and half to get his rx.  He ran out on 10-20-22.  Still having some pain in his leg but getting better.  No nose or gum bleeds, no BRBPR.    HIV 1 RNA Quant  Date Value  05/30/2021 NOT DETECTED copies/mL  03/08/2021 Not Detected Copies/mL  05/21/2020 Not Detected Copies/mL   HIV-1 RNA Viral Load (copies/mL)  Date Value  03/04/2022 <20   CD4 T Cell Abs (/uL)  Date Value  05/30/2021 612  05/21/2020 627  09-02-19 530     Health Maintenance  Topic Date Due   Colonoscopy  02/27/2019   Zoster Vaccines- Shingrix (1 of 2) 01/23/2023 (Originally 07/28/1969)   INFLUENZA VACCINE   05/25/2023 (Originally 09/25/2022)   Medicare Annual Wellness (AWV)  10/01/2023   DTaP/Tdap/Td (2 - Td or Tdap) 12/30/2026   Pneumonia Vaccine 3+ Years old  Completed   Hepatitis C Screening  Completed   HPV VACCINES  Aged Out   COVID-19 Vaccine  Discontinued      Review of Systems  Constitutional:  Negative for chills, fever and weight loss.  Respiratory:  Positive for shortness of breath. Negative for cough.   Cardiovascular:  Negative for chest pain.  Gastrointestinal:  Negative for blood in stool, constipation and diarrhea.  Genitourinary:  Negative for dysuria.  Musculoskeletal:  Positive for myalgias.    Please see HPI. All other systems reviewed and negative.     Objective:  Physical Exam Vitals reviewed.  Constitutional:      Appearance: Normal appearance. He is obese.  HENT:     Mouth/Throat:     Mouth: Mucous membranes are moist.     Pharynx: No oropharyngeal exudate.  Eyes:     Extraocular Movements: Extraocular movements intact.     Pupils: Pupils are equal, round, and reactive to light.  Cardiovascular:     Rate and Rhythm: Normal rate and regular rhythm.  Pulmonary:     Effort: Pulmonary effort is normal.     Breath sounds:  Normal breath sounds.  Abdominal:     General: Bowel sounds are normal. There is no distension.     Palpations: Abdomen is soft.     Tenderness: There is no abdominal tenderness.  Musculoskeletal:        General: Normal range of motion.     Cervical back: Normal range of motion and neck supple.     Comments: Slight increase in size LLE vs RLE.   Neurological:     General: No focal deficit present.     Mental Status: He is alert.  Psychiatric:        Mood and Affect: Mood normal.            Assessment & Plan:

## 2022-10-21 NOTE — Assessment & Plan Note (Addendum)
He is doing well Flu vax today COVID vax at his local pharm. He defers.  Will get CD4 and HIV RNA today.  Offered/refused condoms.  Will see him back in 9 months.

## 2022-10-21 NOTE — Assessment & Plan Note (Signed)
Fairly well controlled today.  

## 2022-10-22 DIAGNOSIS — R069 Unspecified abnormalities of breathing: Secondary | ICD-10-CM | POA: Diagnosis not present

## 2022-10-22 DIAGNOSIS — I10 Essential (primary) hypertension: Secondary | ICD-10-CM | POA: Diagnosis not present

## 2022-10-22 DIAGNOSIS — Z87891 Personal history of nicotine dependence: Secondary | ICD-10-CM | POA: Diagnosis not present

## 2022-10-22 DIAGNOSIS — R231 Pallor: Secondary | ICD-10-CM | POA: Diagnosis not present

## 2022-10-22 DIAGNOSIS — Z743 Need for continuous supervision: Secondary | ICD-10-CM | POA: Diagnosis not present

## 2022-10-22 DIAGNOSIS — R0602 Shortness of breath: Secondary | ICD-10-CM | POA: Diagnosis not present

## 2022-10-22 DIAGNOSIS — Z20822 Contact with and (suspected) exposure to covid-19: Secondary | ICD-10-CM | POA: Diagnosis not present

## 2022-10-22 DIAGNOSIS — I700 Atherosclerosis of aorta: Secondary | ICD-10-CM | POA: Diagnosis not present

## 2022-10-22 DIAGNOSIS — R0603 Acute respiratory distress: Secondary | ICD-10-CM | POA: Diagnosis not present

## 2022-10-22 DIAGNOSIS — R0689 Other abnormalities of breathing: Secondary | ICD-10-CM | POA: Diagnosis not present

## 2022-10-22 LAB — T-HELPER CELLS (CD4) COUNT (NOT AT ARMC)
CD4 % Helper T Cell: 40 % (ref 33–65)
CD4 T Cell Abs: 660 /uL (ref 400–1790)

## 2022-10-23 DIAGNOSIS — I491 Atrial premature depolarization: Secondary | ICD-10-CM | POA: Diagnosis not present

## 2022-10-23 LAB — HIV-1 RNA QUANT-NO REFLEX-BLD
HIV-1 RNA Viral Load Log: 1.602 {Log_copies}/mL
HIV-1 RNA Viral Load: 40 {copies}/mL

## 2022-11-04 ENCOUNTER — Ambulatory Visit: Payer: Medicare HMO | Admitting: Vascular Surgery

## 2022-11-04 ENCOUNTER — Encounter: Payer: Self-pay | Admitting: Vascular Surgery

## 2022-11-04 VITALS — BP 126/79 | HR 87 | Temp 98.6°F | Resp 18 | Ht 68.0 in | Wt 215.9 lb

## 2022-11-04 DIAGNOSIS — I82442 Acute embolism and thrombosis of left tibial vein: Secondary | ICD-10-CM | POA: Diagnosis not present

## 2022-11-04 NOTE — Progress Notes (Signed)
Patient name: Brandon Joyce MRN: [id number] DOB: [birth year] Sex: male  REASON FOR CONSULT: thrombus left anterior tibial vein  HPI: Brandon Joyce is a 72 y.o. male, with hx HIV, HTN, HLD, CKD stage 2 that presents for evaluation of left leg DVT.  He was seen in ED on 7/19 with leg pain and diagnosed with a left anterior tibial vein thrombus consistent with a DVT.  He states he had no associated trauma prior to this.  He had no associated travel.  He had no illnesses.  He has no active malignancies.  He was started on Eliquis and states his leg pain has gotten a lot better.  Swelling in his left leg has improved.  Past Medical History:  Diagnosis Date   Alcoholism (HCC)    Chronic kidney disease    followed by nephro   ED (erectile dysfunction)    HIV disease (HCC)    HLD (hyperlipidemia)    HTN (hypertension)    Pneumonia    Strain of neck muscle 03/23/2017    Past Surgical History:  Procedure Laterality Date   BACK SURGERY     INCISION AND DRAINAGE ABSCESS N/A 12/07/2019   Procedure: INCISION AND DRAINAGE BUTTOCK ABSCESS;  Surgeon: Griselda Miner, MD;  Location: [facility];  Service: General;  Laterality: N/A;   LEFT HEART CATHETERIZATION WITH CORONARY ANGIOGRAM N/A 07/28/2013   Procedure: LEFT HEART CATHETERIZATION WITH CORONARY ANGIOGRAM;  Surgeon: Corky Crafts, MD;  Location: [facility] CATH LAB;  Service: Cardiovascular;  Laterality: N/A;   MASS EXCISION Left 01/25/2020   Procedure: 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;  Surgeon: Griselda Miner, MD;  Location: [facility];  Service: General;  Laterality: Left;   ROTATOR CUFF REPAIR Right 04-27-2015   TONSILLECTOMY      Family History  Problem Relation Age of Onset   Diverticulitis Mother    Heart disease Mother    Atrial fibrillation Mother    Heart disease Father        CABG   Colon cancer Brother    Lung disease Neg Hx     SOCIAL HISTORY: Social History   Socioeconomic History    Marital status: Married    Spouse name: Helmut Muster   Number of children: 3   Years of education: Not on file   Highest education level: GED or equivalent  Occupational History    Employer: [employer]  Tobacco Use   Smoking status: Never   Smokeless tobacco: Never  Vaping Use   Vaping status: Never Used  Substance and Sexual Activity   Alcohol use: Yes    Alcohol/week: 21.0 standard drinks of alcohol    Types: 21 Standard drinks or equivalent per week    Comment: beer and whiskey - has increased   Drug use: No   Sexual activity: Not Currently    Partners: Female    Birth control/protection: Condom    Comment: accepted condoms  Other Topics Concern   Not on file  Social History Narrative   Not on file   Social Determinants of Health   Financial Resource Strain: Low Risk  (06/09/2022)   Overall Financial Resource Strain (CARDIA)    Difficulty of Paying Living Expenses: Not very hard  Recent Concern: Financial Resource Strain - Medium Risk (06/04/2022)   Overall Financial Resource Strain (CARDIA)    Difficulty of Paying Living Expenses: Somewhat hard  Food Insecurity: No Food Insecurity (10/21/2022)   Hunger Vital Sign  Worried About Programme researcher, broadcasting/film/video in the Last Year: Never true    Ran Out of Food in the Last Year: Never true  Transportation Needs: No Transportation Needs (10/21/2022)   PRAPARE - Administrator, Civil Service (Medical): No    Lack of Transportation (Non-Medical): No  Physical Activity: Inactive (10/01/2022)   Exercise Vital Sign    Days of Exercise per Week: 3 days    Minutes of Exercise per Session: 0 min  Stress: No Stress Concern Present (10/01/2022)   [employer] of Occupational Health - Occupational Stress Questionnaire    Feeling of Stress : Only a little  Social Connections: Moderately Isolated (10/01/2022)   Social Connection and Isolation Panel [NHANES]    Frequency of Communication with Friends and Family: Three times a  week    Frequency of Social Gatherings with Friends and Family: Once a week    Attends Religious Services: Never    Database administrator or Organizations: No    Attends Banker Meetings: Never    Marital Status: Married  Catering manager Violence: Not At Risk (10/21/2022)   Humiliation, Afraid, Rape, and Kick questionnaire    Fear of Current or Ex-Partner: No    Emotionally Abused: No    Physically Abused: No    Sexually Abused: No    Allergies  Allergen Reactions   Other Rash and Other (See Comments)    Other reaction(s): Other (See Comments), Rash Childhood allergy Childhood allergy Causes "headaches"   Sulfa Antibiotics Rash and Other (See Comments)    Childhood allergy Childhood allergy Other reaction(s): Unknown Other reaction(s): Other (See Comments) Childhood allergy Childhood allergy  Other reaction(s): Other (See Comments) Childhood allergy Other reaction(s): Rash Childhood allergy Other reaction(s): Other (See Comments) Childhood allergy Childhood allergy Other reaction(s): Other (See Comments) Childhood allergy Other reaction(s): Other (See Comments) Childhood allergy   Sulfacetamide Sodium Rash    Other reaction(s): Other (See Comments) Childhood allergy   Sulfasalazine Rash    Other reaction(s): Other (See Comments) Childhood allergy   Atenolol Other (See Comments)    "dropped blood pressure" "dropped blood pressure"   Isosorbide Other (See Comments)    Causes "headaches" Causes "headaches"   Nitrates, Organic     Causes "headaches"   Pentaerythritol Tetranitrate Other (See Comments)    Causes "headaches" Causes "headaches"    Current Outpatient Medications  Medication Sig Dispense Refill   albuterol (VENTOLIN HFA) 108 (90 Base) MCG/ACT inhaler Inhale 2 puffs into the lungs every 6 (six) hours as needed for wheezing or shortness of breath. 8 g 2   amLODipine (NORVASC) 10 MG tablet Take 1 tablet (10 mg total) by mouth daily. 90  tablet 3   apixaban (ELIQUIS) 5 MG TABS tablet Take 1 tablet (5 mg total) by mouth 2 (two) times daily. 60 tablet 3   atorvastatin (LIPITOR) 20 MG tablet Take 1 tablet by mouth once daily 30 tablet 0   azelastine (ASTELIN) 0.1 % nasal spray Place 1 spray into both nostrils daily. Use in each nostril as directed 30 mL 12   BIKTARVY 50-200-25 MG TABS tablet Take 1 tablet by mouth daily. 90 tablet 5   fluticasone (FLONASE) 50 MCG/ACT nasal spray Place 1 spray into both nostrils daily. 18.2 mL 2   fluticasone-salmeterol (WIXELA INHUB) 100-50 MCG/ACT AEPB Inhale 1 puff into the lungs 2 (two) times daily. 60 each 2   hydrALAZINE (APRESOLINE) 50 MG tablet Take 1 tablet (50 mg total) by mouth  3 (three) times daily. 270 tablet 3   oxyCODONE (ROXICODONE) 5 MG immediate release tablet Take 1 tablet (5 mg total) by mouth every 4 (four) hours as needed for severe pain. 15 tablet 0   oxyCODONE-acetaminophen (PERCOCET/ROXICET) 5-325 MG tablet Take 1 tablet by mouth every 8 (eight) hours as needed for severe pain. (Patient not taking: Reported on 11/04/2022) 6 tablet 0   oxymetazoline (AFRIN) 0.05 % nasal spray Place 1 spray into both nostrils Once PRN for congestion. (Patient not taking: Reported on 11/04/2022)     spironolactone (ALDACTONE) 25 MG tablet Take 0.5 tablets (12.5 mg total) by mouth daily. (Patient not taking: Reported on 07/17/2022) 90 tablet 3   No current facility-administered medications for this visit.    REVIEW OF SYSTEMS:  [X]  denotes positive finding, [ ]  denotes negative finding Cardiac  Comments:  Chest pain or chest pressure:    Shortness of breath upon exertion:    Short of breath when lying flat:    Irregular heart rhythm:        Vascular    Pain in calf, thigh, or hip brought on by ambulation:    Pain in feet at night that wakes you up from your sleep:     Blood clot in your veins:    Leg swelling:         Pulmonary    Oxygen at home:    Productive cough:     Wheezing:          Neurologic    Sudden weakness in arms or legs:     Sudden numbness in arms or legs:     Sudden onset of difficulty speaking or slurred speech:    Temporary loss of vision in one eye:     Problems with dizziness:         Gastrointestinal    Blood in stool:     Vomited blood:         Genitourinary    Burning when urinating:     Blood in urine:        Psychiatric    Major depression:         Hematologic    Bleeding problems:    Problems with blood clotting too easily:        Skin    Rashes or ulcers:        Constitutional    Fever or chills:      PHYSICAL EXAM: Vitals:   11/04/22 1354  BP: 126/79  Pulse: 87  Resp: 18  Temp: 98.6 F (37 C)  TempSrc: Temporal  SpO2: 93%  Weight: 215 lb 14.4 oz (97.9 kg)  Height: 5\' 8"  (1.727 m)    GENERAL: The patient is a well-nourished male, in no acute distress. The vital signs are documented above. CARDIAC: There is a regular rate and rhythm.  VASCULAR:  Bilateral femoral pulses palpable Bilateral DP PT pulses palpable No marked left lower extremity edema No lower extremity tissue loss PULMONARY: No respiratory distress. ABDOMEN: Soft and non-tender. MUSCULOSKELETAL: There are no major deformities or cyanosis. NEUROLOGIC: No focal weakness or paresthesias are detected. PSYCHIATRIC: The patient has a normal affect.  DATA:   Left leg DVT study 09/12/2022 with a short segment thrombus in the left anterior anterior tibial vein  Assessment/Plan:  72 y.o. male, with hx HIV, HTN, HLD, CKD stage 2 that presents for evaluation of left leg DVT.  He was seen in ED on 7/19 with leg pain diagnosed with a  left anterior tibial vein thrombus consistent with a DVT.  I discussed first and foremost this is a small DVT that does not require any percutaneous mechanical thrombectomy or surgical intervention.  This can be treated with anticoagulation alone.  I discussed for a first time provoked DVT we recommend 3 to 6 months of  anticoagulation.  However, although this is his first DVT it appears unprovoked as he has no associated risk factors around the time his DVT was diagnosed.  I discussed he should make sure his cancer screenings are updated.  I will refer him to hematology oncology for hypercoagulable workup.   Cephus Shelling, MD [facility] Office: [phone]

## 2022-11-05 DIAGNOSIS — N400 Enlarged prostate without lower urinary tract symptoms: Secondary | ICD-10-CM | POA: Diagnosis not present

## 2022-11-05 DIAGNOSIS — E278 Other specified disorders of adrenal gland: Secondary | ICD-10-CM | POA: Diagnosis not present

## 2022-11-13 ENCOUNTER — Telehealth: Payer: Self-pay | Admitting: *Deleted

## 2022-11-13 ENCOUNTER — Encounter: Payer: Self-pay | Admitting: *Deleted

## 2022-11-13 ENCOUNTER — Other Ambulatory Visit: Payer: Medicare HMO | Admitting: *Deleted

## 2022-11-13 NOTE — Patient Outreach (Addendum)
Care Management    Visit Note   09/10/2022 Name: Brandon Joyce    MRN: [id number]       DOB: 07-17-1950   Subjective: Brandon Joyce is a 72 y.o. year old male who is a primary care patient of Pcp, No. The Care Management team was consulted for assistance.       Engaged with patient Engaged with patient by telephone.    Assessment: Patient reports he has all medications including Breo Ellipta inhaler and taking as prescribed.  Patient continues seeing Dr. Ninetta Lights as primary care provider and feels this is working well for him.  Patient reports he followed up with vascular for DVT on 11/04/22, states swelling  has resolved and taking Eliquis as prescribed, pt reports he is no longer weighing daily and " can't keep up with it" Patient reports he is having MRI on 11/18/22 at 10 am in [facility] for follow up on "adrenal gland issue"   Interventions: Evaluation of current treatment plan related to HTN, Asthma, and CHF and patient's adherence to plan as established by provider. Advised patient to continue checking blood pressure at least 3 x per week and keeping a log, pt reports blood pressure has been "okay", encouraged pt to weigh daily and record, pt has not been consistent with weighing and has now stopped weighing. HTN, Asthma, CHF- reinforced Asthma and HF action plan, importance of adherence to low sodium diet. Reviewed medications with patient and discussed importance of taking as prescribed and timely refills Advised patient, providing education and rationale, to monitor blood pressure daily and record, calling Dr. Ninetta Lights for findings outside established parameters.  Advised patient, providing education and rationale, to weigh daily and record, calling Dr. Ninetta Lights  for weight gain of 3lbs overnight or 5 pounds in a week, pt verbalizes understanding Reviewed all upcoming scheduled appointments Reviewed plan of care, patient would like to continue with care management as he feels this is helpful  for management of chronic health conditions   Recommendations:   Follow up with Dr. Ninetta Lights for any concerns you may have, take all medications as prescribed, abstain from alcohol, follow HF and Asthma action plan, weigh daily, call your doctor early on for change in health status/ symptoms, attend all scheduled appointments   Consent to Services:  Patient previously consented   Plan: Telephone follow up appointment with care management team member scheduled for:  02/03/23 at 10am

## 2022-11-13 NOTE — Patient Instructions (Addendum)
Visit Information  Thank you for taking time to visit with me today. Please don't hesitate to contact me if I can be of assistance to you before our next scheduled telephone appointment.  Following are the goals we discussed today- Recommendations:   Follow up with Dr. Ninetta Lights for any concerns you may have, take all medications as prescribed, abstain from alcohol, follow HF and Asthma action plan, weigh daily, call your doctor early on for change in health status/ symptoms, attend all scheduled appointments  Our next appointment is by telephone on 02/03/23 at 930 am  Please call the care guide team at [phone] if you need to cancel or reschedule your appointment.   If you are experiencing a Mental Health or Behavioral Health Crisis or need someone to talk to, please call the Suicide and Crisis Lifeline: 988 call the [place] National Suicide Prevention Lifeline: 531 214 7323 or TTY: 512-398-7589 TTY (782)607-0196) to talk to a trained counselor call 1-800-273-TALK (toll free, 24 hour hotline) go to [facility] [address] [place] [phone]) call 911   Patient verbalizes understanding of instructions and care plan provided today and agrees to view in MyChart. Active MyChart status and patient understanding of how to access instructions and care plan via MyChart confirmed with patient.     Telephone follow up appointment with care management team member scheduled for: 02/03/23 at 930 am  [name] [facility], BSN [facility] Health/ Ambulatory Care Management [phone]

## 2022-11-13 NOTE — Patient Outreach (Signed)
Care Management   Follow Up Note   11/13/2022 Name: Brandon Joyce MRN: [id number] DOB: 1951/01/21   Referred by: Pcp, No Reason for referral : Care Coordination (COPD, CHF)   An unsuccessful telephone outreach was attempted today. The patient was referred to the case management team for assistance with care management and care coordination.   Follow Up Plan: Telephone follow up appointment with care management team member scheduled for: upon care guide rescheduling.  [name] [facility], BSN [place]/ Ambulatory Care Management [phone]

## 2022-11-18 DIAGNOSIS — E278 Other specified disorders of adrenal gland: Secondary | ICD-10-CM | POA: Diagnosis not present

## 2022-11-18 DIAGNOSIS — K760 Fatty (change of) liver, not elsewhere classified: Secondary | ICD-10-CM | POA: Diagnosis not present

## 2022-11-18 DIAGNOSIS — N3289 Other specified disorders of bladder: Secondary | ICD-10-CM | POA: Diagnosis not present

## 2022-11-24 ENCOUNTER — Inpatient Hospital Stay: Payer: Medicare HMO | Attending: Medical Oncology | Admitting: Medical Oncology

## 2022-11-24 ENCOUNTER — Inpatient Hospital Stay: Payer: Medicare HMO

## 2022-11-25 ENCOUNTER — Encounter: Payer: Self-pay | Admitting: Medical Oncology

## 2022-12-08 DIAGNOSIS — N400 Enlarged prostate without lower urinary tract symptoms: Secondary | ICD-10-CM | POA: Diagnosis not present

## 2022-12-08 DIAGNOSIS — R0600 Dyspnea, unspecified: Secondary | ICD-10-CM | POA: Diagnosis not present

## 2022-12-11 DIAGNOSIS — J441 Chronic obstructive pulmonary disease with (acute) exacerbation: Secondary | ICD-10-CM | POA: Diagnosis not present

## 2022-12-11 DIAGNOSIS — R062 Wheezing: Secondary | ICD-10-CM | POA: Diagnosis not present

## 2022-12-11 DIAGNOSIS — Z743 Need for continuous supervision: Secondary | ICD-10-CM | POA: Diagnosis not present

## 2022-12-11 DIAGNOSIS — J329 Chronic sinusitis, unspecified: Secondary | ICD-10-CM | POA: Diagnosis not present

## 2022-12-11 DIAGNOSIS — I10 Essential (primary) hypertension: Secondary | ICD-10-CM | POA: Diagnosis not present

## 2022-12-11 DIAGNOSIS — J80 Acute respiratory distress syndrome: Secondary | ICD-10-CM | POA: Diagnosis not present

## 2022-12-11 DIAGNOSIS — J45901 Unspecified asthma with (acute) exacerbation: Secondary | ICD-10-CM | POA: Diagnosis not present

## 2022-12-11 DIAGNOSIS — Z20822 Contact with and (suspected) exposure to covid-19: Secondary | ICD-10-CM | POA: Diagnosis not present

## 2022-12-12 ENCOUNTER — Ambulatory Visit: Payer: Medicare HMO | Admitting: Internal Medicine

## 2022-12-12 DIAGNOSIS — I517 Cardiomegaly: Secondary | ICD-10-CM | POA: Diagnosis not present

## 2022-12-22 DIAGNOSIS — Z880 Allergy status to penicillin: Secondary | ICD-10-CM | POA: Diagnosis not present

## 2022-12-22 DIAGNOSIS — Z743 Need for continuous supervision: Secondary | ICD-10-CM | POA: Diagnosis not present

## 2022-12-22 DIAGNOSIS — R0602 Shortness of breath: Secondary | ICD-10-CM | POA: Diagnosis not present

## 2022-12-22 DIAGNOSIS — J9601 Acute respiratory failure with hypoxia: Secondary | ICD-10-CM | POA: Diagnosis not present

## 2022-12-22 DIAGNOSIS — I517 Cardiomegaly: Secondary | ICD-10-CM | POA: Diagnosis not present

## 2022-12-22 DIAGNOSIS — I498 Other specified cardiac arrhythmias: Secondary | ICD-10-CM | POA: Diagnosis not present

## 2022-12-22 DIAGNOSIS — Z7901 Long term (current) use of anticoagulants: Secondary | ICD-10-CM | POA: Diagnosis not present

## 2022-12-22 DIAGNOSIS — R053 Chronic cough: Secondary | ICD-10-CM | POA: Diagnosis not present

## 2022-12-22 DIAGNOSIS — Z86718 Personal history of other venous thrombosis and embolism: Secondary | ICD-10-CM | POA: Diagnosis not present

## 2022-12-22 DIAGNOSIS — R079 Chest pain, unspecified: Secondary | ICD-10-CM | POA: Diagnosis not present

## 2022-12-22 DIAGNOSIS — R000 Tachycardia, unspecified: Secondary | ICD-10-CM | POA: Diagnosis not present

## 2022-12-22 DIAGNOSIS — Z8249 Family history of ischemic heart disease and other diseases of the circulatory system: Secondary | ICD-10-CM | POA: Diagnosis not present

## 2022-12-22 DIAGNOSIS — Z882 Allergy status to sulfonamides status: Secondary | ICD-10-CM | POA: Diagnosis not present

## 2022-12-22 DIAGNOSIS — Z833 Family history of diabetes mellitus: Secondary | ICD-10-CM | POA: Diagnosis not present

## 2022-12-22 DIAGNOSIS — E7800 Pure hypercholesterolemia, unspecified: Secondary | ICD-10-CM | POA: Diagnosis not present

## 2022-12-22 DIAGNOSIS — I2510 Atherosclerotic heart disease of native coronary artery without angina pectoris: Secondary | ICD-10-CM | POA: Diagnosis not present

## 2022-12-22 DIAGNOSIS — I10 Essential (primary) hypertension: Secondary | ICD-10-CM | POA: Diagnosis not present

## 2022-12-22 DIAGNOSIS — Z87891 Personal history of nicotine dependence: Secondary | ICD-10-CM | POA: Diagnosis not present

## 2022-12-22 DIAGNOSIS — Z7951 Long term (current) use of inhaled steroids: Secondary | ICD-10-CM | POA: Diagnosis not present

## 2022-12-22 DIAGNOSIS — I7121 Aneurysm of the ascending aorta, without rupture: Secondary | ICD-10-CM | POA: Diagnosis not present

## 2022-12-22 DIAGNOSIS — Z881 Allergy status to other antibiotic agents status: Secondary | ICD-10-CM | POA: Diagnosis not present

## 2022-12-22 DIAGNOSIS — Z79899 Other long term (current) drug therapy: Secondary | ICD-10-CM | POA: Diagnosis not present

## 2022-12-22 DIAGNOSIS — I129 Hypertensive chronic kidney disease with stage 1 through stage 4 chronic kidney disease, or unspecified chronic kidney disease: Secondary | ICD-10-CM | POA: Diagnosis not present

## 2022-12-22 DIAGNOSIS — J45909 Unspecified asthma, uncomplicated: Secondary | ICD-10-CM | POA: Diagnosis not present

## 2022-12-22 DIAGNOSIS — Z21 Asymptomatic human immunodeficiency virus [HIV] infection status: Secondary | ICD-10-CM | POA: Diagnosis not present

## 2022-12-22 DIAGNOSIS — R0789 Other chest pain: Secondary | ICD-10-CM | POA: Diagnosis not present

## 2022-12-22 DIAGNOSIS — N1831 Chronic kidney disease, stage 3a: Secondary | ICD-10-CM | POA: Diagnosis not present

## 2022-12-22 DIAGNOSIS — R0902 Hypoxemia: Secondary | ICD-10-CM | POA: Diagnosis not present

## 2022-12-22 DIAGNOSIS — R062 Wheezing: Secondary | ICD-10-CM | POA: Diagnosis not present

## 2022-12-22 DIAGNOSIS — Z888 Allergy status to other drugs, medicaments and biological substances status: Secondary | ICD-10-CM | POA: Diagnosis not present

## 2022-12-22 DIAGNOSIS — J849 Interstitial pulmonary disease, unspecified: Secondary | ICD-10-CM | POA: Diagnosis not present

## 2022-12-22 DIAGNOSIS — Z20822 Contact with and (suspected) exposure to covid-19: Secondary | ICD-10-CM | POA: Diagnosis not present

## 2022-12-23 DIAGNOSIS — R079 Chest pain, unspecified: Secondary | ICD-10-CM | POA: Diagnosis not present

## 2022-12-23 DIAGNOSIS — D3500 Benign neoplasm of unspecified adrenal gland: Secondary | ICD-10-CM | POA: Diagnosis not present

## 2022-12-23 DIAGNOSIS — J9601 Acute respiratory failure with hypoxia: Secondary | ICD-10-CM | POA: Diagnosis not present

## 2022-12-23 DIAGNOSIS — R0789 Other chest pain: Secondary | ICD-10-CM | POA: Diagnosis not present

## 2022-12-28 DIAGNOSIS — R0600 Dyspnea, unspecified: Secondary | ICD-10-CM | POA: Diagnosis not present

## 2022-12-28 DIAGNOSIS — R0602 Shortness of breath: Secondary | ICD-10-CM | POA: Diagnosis not present

## 2022-12-28 DIAGNOSIS — B20 Human immunodeficiency virus [HIV] disease: Secondary | ICD-10-CM | POA: Diagnosis not present

## 2022-12-28 DIAGNOSIS — I10 Essential (primary) hypertension: Secondary | ICD-10-CM | POA: Diagnosis not present

## 2022-12-28 DIAGNOSIS — Z86718 Personal history of other venous thrombosis and embolism: Secondary | ICD-10-CM | POA: Diagnosis not present

## 2022-12-28 DIAGNOSIS — R0689 Other abnormalities of breathing: Secondary | ICD-10-CM | POA: Diagnosis not present

## 2022-12-28 DIAGNOSIS — Z743 Need for continuous supervision: Secondary | ICD-10-CM | POA: Diagnosis not present

## 2022-12-28 DIAGNOSIS — Z981 Arthrodesis status: Secondary | ICD-10-CM | POA: Diagnosis not present

## 2022-12-28 DIAGNOSIS — J9601 Acute respiratory failure with hypoxia: Secondary | ICD-10-CM | POA: Diagnosis not present

## 2022-12-28 DIAGNOSIS — I700 Atherosclerosis of aorta: Secondary | ICD-10-CM | POA: Diagnosis not present

## 2022-12-28 DIAGNOSIS — R0902 Hypoxemia: Secondary | ICD-10-CM | POA: Diagnosis not present

## 2022-12-28 DIAGNOSIS — J45901 Unspecified asthma with (acute) exacerbation: Secondary | ICD-10-CM | POA: Diagnosis not present

## 2022-12-28 DIAGNOSIS — J4541 Moderate persistent asthma with (acute) exacerbation: Secondary | ICD-10-CM | POA: Diagnosis not present

## 2022-12-28 DIAGNOSIS — R062 Wheezing: Secondary | ICD-10-CM | POA: Diagnosis not present

## 2022-12-28 DIAGNOSIS — Z7901 Long term (current) use of anticoagulants: Secondary | ICD-10-CM | POA: Diagnosis not present

## 2022-12-29 DIAGNOSIS — R1310 Dysphagia, unspecified: Secondary | ICD-10-CM | POA: Diagnosis not present

## 2022-12-29 DIAGNOSIS — I252 Old myocardial infarction: Secondary | ICD-10-CM | POA: Diagnosis not present

## 2022-12-29 DIAGNOSIS — I444 Left anterior fascicular block: Secondary | ICD-10-CM | POA: Diagnosis not present

## 2022-12-29 DIAGNOSIS — J4541 Moderate persistent asthma with (acute) exacerbation: Secondary | ICD-10-CM | POA: Diagnosis not present

## 2022-12-29 DIAGNOSIS — R059 Cough, unspecified: Secondary | ICD-10-CM | POA: Diagnosis not present

## 2022-12-31 DIAGNOSIS — R0981 Nasal congestion: Secondary | ICD-10-CM | POA: Diagnosis not present

## 2022-12-31 DIAGNOSIS — J310 Chronic rhinitis: Secondary | ICD-10-CM | POA: Diagnosis not present

## 2022-12-31 DIAGNOSIS — T485X5A Adverse effect of other anti-common-cold drugs, initial encounter: Secondary | ICD-10-CM | POA: Diagnosis not present

## 2023-01-01 ENCOUNTER — Inpatient Hospital Stay: Payer: Medicare HMO | Attending: Medical Oncology | Admitting: Medical Oncology

## 2023-01-01 ENCOUNTER — Inpatient Hospital Stay: Payer: Medicare HMO

## 2023-01-01 DIAGNOSIS — R0602 Shortness of breath: Secondary | ICD-10-CM | POA: Diagnosis not present

## 2023-01-01 DIAGNOSIS — J449 Chronic obstructive pulmonary disease, unspecified: Secondary | ICD-10-CM | POA: Diagnosis not present

## 2023-01-02 DIAGNOSIS — J984 Other disorders of lung: Secondary | ICD-10-CM | POA: Diagnosis not present

## 2023-01-06 ENCOUNTER — Ambulatory Visit: Payer: Medicare HMO | Admitting: Infectious Diseases

## 2023-01-06 ENCOUNTER — Other Ambulatory Visit: Payer: Self-pay

## 2023-01-06 ENCOUNTER — Encounter: Payer: Self-pay | Admitting: Infectious Diseases

## 2023-01-06 VITALS — BP 132/76 | HR 103 | Temp 98.0°F | Ht 68.0 in | Wt 212.6 lb

## 2023-01-06 DIAGNOSIS — I10 Essential (primary) hypertension: Secondary | ICD-10-CM

## 2023-01-06 DIAGNOSIS — J4521 Mild intermittent asthma with (acute) exacerbation: Secondary | ICD-10-CM

## 2023-01-06 DIAGNOSIS — N1831 Chronic kidney disease, stage 3a: Secondary | ICD-10-CM

## 2023-01-06 DIAGNOSIS — B20 Human immunodeficiency virus [HIV] disease: Secondary | ICD-10-CM

## 2023-01-06 DIAGNOSIS — I82442 Acute embolism and thrombosis of left tibial vein: Secondary | ICD-10-CM

## 2023-01-06 DIAGNOSIS — I5032 Chronic diastolic (congestive) heart failure: Secondary | ICD-10-CM

## 2023-01-06 NOTE — Assessment & Plan Note (Signed)
Appreciate his f/u with Pulm and ENT.

## 2023-01-06 NOTE — Assessment & Plan Note (Signed)
He is doing well VL has come down.  Will continue biktarvy Will see him back in 3 months due to his multiple comorbidities.  PCV 20 due 2025 He has gotten flu shot, COVID shot refused (I encouraged him).

## 2023-01-06 NOTE — Assessment & Plan Note (Signed)
Will defer to his PCP to f/u to end of therapy.

## 2023-01-06 NOTE — Progress Notes (Signed)
Subjective:    Patient ID: Brandon Joyce, male  DOB: 10-Mar-1950, 72 y.o.        MRN: [id number]   HPI 72 yo M with HIV+, statin related myalgias, hyperlipidemia, erectile dysfunction (penoplasty), hypertension, ETOH use.  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 TTE 65-70% Grade 2 diastolic dysfunction (12-2021). He has thoracic aortic aneurysm as well, repeat CTA 01-2022.  Had another work related injury fall 2018, C6 pinched nerve.  He had a nerve block which helped for ~ 48h. He had c-spine surgery 10-23-17.  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    Wife is WC bound, prev bed-sore resolved. Has home health nurse helping. Never smoker, wife smokes. Has concerns about mother with continence issues. Continued to fall, won't use walker.  Has help from his sister. Dad deceased 09/21/2021.   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  He was started on eliquis.   Has had 3-4 ED visits since last visit- all for sob. States he has had lung fxn testing (obstructive lung dx), breathing test, told he has airway spasm.  He has also been seen by ENT for nasal obstruction- given flonase, mupirocin (not a surgical candidate). No procedures due to his eliquis. He had f/u u/s which "looked good". He is not sure of end date.  He was started on cough with codiene rx, prednisone, azithro. Cough has improved. Using his inhalers as well.  Has nebulizer at home, supposed to be using bid (not using).   HIV 1 RNA Quant  Date Value  05/30/2021 NOT DETECTED copies/mL  03/08/2021 Not Detected Copies/mL  05/21/2020 Not Detected Copies/mL   HIV-1 RNA Viral Load (copies/mL)  Date Value  10/21/2022 40  03/04/2022 <20   CD4 T Cell Abs (/uL)  Date Value  10/21/2022 660  05/30/2021 612  05/21/2020 627     Health Maintenance  Topic Date Due  . Colonoscopy   02/27/2019  . Zoster Vaccines- Shingrix (1 of 2) 01/23/2023 (Originally 07/28/1969)  . Medicare Annual Wellness (AWV)  10/01/2023  . DTaP/Tdap/Td (2 - Td or Tdap) 12/30/2026  . Pneumonia Vaccine 72+ Years old  Completed  . INFLUENZA VACCINE  Completed  . Hepatitis C Screening  Completed  . HPV VACCINES  Aged Out  . COVID-19 Vaccine  Discontinued      Review of Systems  Constitutional:  Negative for chills, fever and weight loss.  Respiratory:  Positive for cough and shortness of breath.   Cardiovascular:  Positive for leg swelling.  Gastrointestinal:  Negative for constipation and diarrhea.  Genitourinary:  Negative for dysuria.    Please see HPI. All other systems reviewed and negative.     Objective:  Physical Exam Vitals reviewed.  Constitutional:      Appearance: Normal appearance.  HENT:     Mouth/Throat:     Mouth: Mucous membranes are moist.     Pharynx: No oropharyngeal exudate.  Eyes:     Extraocular Movements: Extraocular movements intact.     Pupils: Pupils are equal, round, and reactive to light.  Cardiovascular:     Rate and Rhythm: Normal rate and regular rhythm.  Pulmonary:     Effort: Pulmonary effort is normal.     Breath sounds: Normal breath sounds.  Abdominal:     General: Bowel sounds  are normal. There is no distension.     Palpations: Abdomen is soft.     Tenderness: There is no abdominal tenderness.  Musculoskeletal:        General: Normal range of motion.     Cervical back: Normal range of motion and neck supple.     Right lower leg: No edema.     Left lower leg: No edema.  Neurological:     General: No focal deficit present.     Mental Status: He is alert.          Assessment & Plan:

## 2023-01-06 NOTE — Assessment & Plan Note (Signed)
Appreciate his PCP and CV f/u.

## 2023-01-06 NOTE — Assessment & Plan Note (Signed)
Well controlled 

## 2023-01-06 NOTE — Assessment & Plan Note (Signed)
Lab Results  Component Value Date   CREATININE 1.12 07/22/2022   CREATININE 1.11 06/04/2022   CREATININE 1.29 (H) 03/04/2022   Has been fairly stable.  Will continue on his current art.

## 2023-01-14 ENCOUNTER — Telehealth: Payer: Self-pay | Admitting: *Deleted

## 2023-01-14 DIAGNOSIS — R9431 Abnormal electrocardiogram [ECG] [EKG]: Secondary | ICD-10-CM | POA: Diagnosis not present

## 2023-01-14 DIAGNOSIS — Z79899 Other long term (current) drug therapy: Secondary | ICD-10-CM | POA: Diagnosis not present

## 2023-01-14 DIAGNOSIS — R0603 Acute respiratory distress: Secondary | ICD-10-CM | POA: Diagnosis not present

## 2023-01-14 DIAGNOSIS — Z87891 Personal history of nicotine dependence: Secondary | ICD-10-CM | POA: Diagnosis not present

## 2023-01-14 DIAGNOSIS — I10 Essential (primary) hypertension: Secondary | ICD-10-CM | POA: Diagnosis not present

## 2023-01-14 DIAGNOSIS — R0602 Shortness of breath: Secondary | ICD-10-CM | POA: Diagnosis not present

## 2023-01-14 DIAGNOSIS — Z21 Asymptomatic human immunodeficiency virus [HIV] infection status: Secondary | ICD-10-CM | POA: Diagnosis not present

## 2023-01-14 DIAGNOSIS — R062 Wheezing: Secondary | ICD-10-CM | POA: Diagnosis not present

## 2023-01-14 DIAGNOSIS — Z7901 Long term (current) use of anticoagulants: Secondary | ICD-10-CM | POA: Diagnosis not present

## 2023-01-14 DIAGNOSIS — R0689 Other abnormalities of breathing: Secondary | ICD-10-CM | POA: Diagnosis not present

## 2023-01-14 DIAGNOSIS — Z743 Need for continuous supervision: Secondary | ICD-10-CM | POA: Diagnosis not present

## 2023-01-14 DIAGNOSIS — Z86718 Personal history of other venous thrombosis and embolism: Secondary | ICD-10-CM | POA: Diagnosis not present

## 2023-01-14 DIAGNOSIS — R079 Chest pain, unspecified: Secondary | ICD-10-CM | POA: Diagnosis not present

## 2023-01-14 DIAGNOSIS — J441 Chronic obstructive pulmonary disease with (acute) exacerbation: Secondary | ICD-10-CM | POA: Diagnosis not present

## 2023-01-14 DIAGNOSIS — J9690 Respiratory failure, unspecified, unspecified whether with hypoxia or hypercapnia: Secondary | ICD-10-CM | POA: Diagnosis not present

## 2023-01-14 DIAGNOSIS — J45901 Unspecified asthma with (acute) exacerbation: Secondary | ICD-10-CM | POA: Diagnosis not present

## 2023-01-14 DIAGNOSIS — J4541 Moderate persistent asthma with (acute) exacerbation: Secondary | ICD-10-CM | POA: Diagnosis not present

## 2023-01-14 NOTE — Telephone Encounter (Signed)
Call from patient requesting medication for his Nebulizer.  Was given some from the hospital at discharge. Was told by Dr. Ninetta Lights to continue.  Would like for the prescription to go to the [facility] in [facility] .  [employer].

## 2023-01-14 NOTE — Telephone Encounter (Signed)
I do not see any specific nebulizer medications mentioned in the note, he does say that he is following with PCP and pulmonology,  I wonder if one of those doctors would be able to provide the refill

## 2023-01-14 NOTE — Telephone Encounter (Signed)
Call to patient informed him that he should per the Attending today check with his Pulmonary doctor about getting the medication for his Nebulizer. Has an appointment with his Pulmonary Doctor on tomorrow.  Has enough to last until that visit.  Again stated that Dr. Ninetta Lights told him to continue the Nebulizer.  Patient was informed that Dr,. Ninetta Lights was unavailable at time and to make sure and keep the Pulmonary appointment as this is something they can order for him. As well as their Specialty.

## 2023-01-15 DIAGNOSIS — J9690 Respiratory failure, unspecified, unspecified whether with hypoxia or hypercapnia: Secondary | ICD-10-CM | POA: Diagnosis not present

## 2023-01-15 DIAGNOSIS — J4541 Moderate persistent asthma with (acute) exacerbation: Secondary | ICD-10-CM | POA: Diagnosis not present

## 2023-01-15 DIAGNOSIS — I2510 Atherosclerotic heart disease of native coronary artery without angina pectoris: Secondary | ICD-10-CM | POA: Diagnosis not present

## 2023-01-15 DIAGNOSIS — I517 Cardiomegaly: Secondary | ICD-10-CM | POA: Diagnosis not present

## 2023-01-19 ENCOUNTER — Other Ambulatory Visit: Payer: Self-pay | Admitting: Infectious Diseases

## 2023-01-19 DIAGNOSIS — J471 Bronchiectasis with (acute) exacerbation: Secondary | ICD-10-CM | POA: Diagnosis not present

## 2023-01-19 DIAGNOSIS — J309 Allergic rhinitis, unspecified: Secondary | ICD-10-CM | POA: Diagnosis not present

## 2023-01-19 DIAGNOSIS — B20 Human immunodeficiency virus [HIV] disease: Secondary | ICD-10-CM

## 2023-01-19 DIAGNOSIS — J4551 Severe persistent asthma with (acute) exacerbation: Secondary | ICD-10-CM | POA: Diagnosis not present

## 2023-01-19 NOTE — Telephone Encounter (Signed)
Next appt scheduled 04/08/23 with Dr Ninetta Lights.

## 2023-01-20 ENCOUNTER — Encounter: Payer: Medicare HMO | Admitting: Infectious Diseases

## 2023-01-28 DIAGNOSIS — J309 Allergic rhinitis, unspecified: Secondary | ICD-10-CM | POA: Diagnosis not present

## 2023-01-28 DIAGNOSIS — J4551 Severe persistent asthma with (acute) exacerbation: Secondary | ICD-10-CM | POA: Diagnosis not present

## 2023-01-29 DIAGNOSIS — J4541 Moderate persistent asthma with (acute) exacerbation: Secondary | ICD-10-CM | POA: Diagnosis not present

## 2023-01-29 DIAGNOSIS — I491 Atrial premature depolarization: Secondary | ICD-10-CM | POA: Diagnosis not present

## 2023-01-29 DIAGNOSIS — R079 Chest pain, unspecified: Secondary | ICD-10-CM | POA: Diagnosis not present

## 2023-01-30 DIAGNOSIS — J4541 Moderate persistent asthma with (acute) exacerbation: Secondary | ICD-10-CM | POA: Diagnosis not present

## 2023-01-30 DIAGNOSIS — R079 Chest pain, unspecified: Secondary | ICD-10-CM | POA: Diagnosis not present

## 2023-02-03 ENCOUNTER — Other Ambulatory Visit: Payer: Medicare HMO | Admitting: *Deleted

## 2023-02-03 ENCOUNTER — Ambulatory Visit: Payer: Self-pay

## 2023-02-03 NOTE — Patient Outreach (Signed)
  Care Coordination   Initial Visit Note   02/03/2023 Name: Brandon Joyce MRN: [id number] DOB: Apr 28, 1950  Brandon Joyce is a 72 y.o. year old male who sees Pcp, No for primary care. I spoke with  Brandon Joyce by phone today.  What matters to the patients health and wellness today?  I spoke with Mrs. Brandon Joyce, who informed me that Brandon Joyce was not at home at that time. She indicated that he had momentarily stepped out to go to the church across the street and would return in approximately five minutes. I provided my telephone number so that he could return my call. Upon receiving a call back from Brandon Joyce, he communicated that he is feeling better following his discharge from the hospital. He is utilizing his inhalers and nebulizer, which have contributed to an improvement in his breathing. Brandon Joyce intends to contact his primary care physician, Dr. Ninetta Lights, to schedule an appointment in the near future, as well as his pulmonologist for a subsequent visit. He confirmed that he is adhering to his prescribed medication regimen. Additionally, I have arranged a follow-up appointment with him.     Goals Addressed             This Visit's Progress    I want to continue to Brandon Joyce and CHF       Patient Goals/Self Care Activities: -Patient/Caregiver will take medications as prescribed   -Patient/Caregiver will attend all scheduled provider appointments -Patient/Caregiver will call pharmacy for medication refills 3-7 days in advance of running out of medications -Patient/Caregiver will call provider office for new concerns or questions  -Patient/Caregiver will focus on medication adherence by taking medications as prescribed   Advised patient to track and manage Asthma triggers Provided education about and advised patient to utilize infection prevention strategies to reduce risk of respiratory infection Discussed the importance of adequate rest and management of fatigue with  Asthma Provided education on low sodium diet Discussed importance of daily weight and advised patient to weigh and record daily Discussed the importance of keeping all appointments with provider Asked the patient to consult the doctor about his fluid pill         SDOH assessments and interventions completed:  Yes  SDOH Interventions Today    Flowsheet Row Most Recent Value  SDOH Interventions   Food Insecurity Interventions Intervention Not Indicated  Transportation Interventions Intervention Not Indicated        Care Coordination Interventions:  Yes, provided   Interventions Today    Flowsheet Row Most Recent Value  Chronic Disease   Chronic disease during today's visit Congestive Heart Failure (CHF), Other  [Asthma]  General Interventions   General Interventions Discussed/Reviewed General Interventions Discussed, General Interventions Reviewed  Pharmacy Interventions   Pharmacy Dicussed/Reviewed Pharmacy Topics Discussed, Medications and their functions  Safety Interventions   Safety Discussed/Reviewed Safety Discussed          Follow up plan:  03/24/23  11 am    Encounter Outcome:  Patient Visit Completed   Juanell Fairly RN, BSN, [facility]  [facility], [facility]  Care Coordinator Phone: [phone]

## 2023-02-03 NOTE — Patient Instructions (Signed)
Visit Information  Thank you for taking time to visit with me today. Please don't hesitate to contact me if I can be of assistance to you.   Following are the goals we discussed today:   Goals Addressed             This Visit's Progress    I want to continue to [facility] and CHF       Patient Goals/Self Care Activities: -Patient/Caregiver will take medications as prescribed   -Patient/Caregiver will attend all scheduled provider appointments -Patient/Caregiver will call pharmacy for medication refills 3-7 days in advance of running out of medications -Patient/Caregiver will call provider office for new concerns or questions  -Patient/Caregiver will focus on medication adherence by taking medications as prescribed   Advised patient to track and manage Asthma triggers Provided education about and advised patient to utilize infection prevention strategies to reduce risk of respiratory infection Discussed the importance of adequate rest and management of fatigue with Asthma Provided education on low sodium diet Discussed importance of daily weight and advised patient to weigh and record daily Discussed the importance of keeping all appointments with provider Asked the patient to consult the doctor about his fluid pill         Our next appointment is by telephone on 03/23/22 at 11 am  Please call the care guide team at [phone] if you need to cancel or reschedule your appointment.   If you are experiencing a Mental Health or Behavioral Health Crisis or need someone to talk to, please call 1-800-273-TALK (toll free, 24 hour hotline)  Patient verbalizes understanding of instructions and care plan provided today and agrees to view in MyChart. Active MyChart status and patient understanding of how to access instructions and care plan via MyChart confirmed with patient.     Juanell Fairly RN, BSN, [facility]  [facility], [facility]  Care  Coordinator Phone: [phone]

## 2023-02-04 DIAGNOSIS — J4551 Severe persistent asthma with (acute) exacerbation: Secondary | ICD-10-CM | POA: Diagnosis not present

## 2023-02-04 DIAGNOSIS — J471 Bronchiectasis with (acute) exacerbation: Secondary | ICD-10-CM | POA: Diagnosis not present

## 2023-02-04 DIAGNOSIS — J309 Allergic rhinitis, unspecified: Secondary | ICD-10-CM | POA: Diagnosis not present

## 2023-02-06 DIAGNOSIS — Z1330 Encounter for screening examination for mental health and behavioral disorders, unspecified: Secondary | ICD-10-CM | POA: Diagnosis not present

## 2023-02-06 DIAGNOSIS — I10 Essential (primary) hypertension: Secondary | ICD-10-CM | POA: Diagnosis not present

## 2023-02-06 DIAGNOSIS — R0602 Shortness of breath: Secondary | ICD-10-CM | POA: Diagnosis not present

## 2023-02-06 DIAGNOSIS — E7800 Pure hypercholesterolemia, unspecified: Secondary | ICD-10-CM | POA: Diagnosis not present

## 2023-02-06 DIAGNOSIS — E876 Hypokalemia: Secondary | ICD-10-CM | POA: Diagnosis not present

## 2023-02-07 ENCOUNTER — Other Ambulatory Visit: Payer: Self-pay | Admitting: Infectious Diseases

## 2023-02-07 DIAGNOSIS — I82442 Acute embolism and thrombosis of left tibial vein: Secondary | ICD-10-CM

## 2023-02-09 NOTE — Telephone Encounter (Signed)
 Next appt scheduled 04/08/23 with Dr Ninetta Lights.

## 2023-02-13 ENCOUNTER — Other Ambulatory Visit: Payer: Self-pay | Admitting: Family

## 2023-02-13 DIAGNOSIS — D6859 Other primary thrombophilia: Secondary | ICD-10-CM

## 2023-02-13 DIAGNOSIS — I82442 Acute embolism and thrombosis of left tibial vein: Secondary | ICD-10-CM

## 2023-02-16 ENCOUNTER — Inpatient Hospital Stay: Payer: Medicare HMO | Admitting: Family

## 2023-02-16 ENCOUNTER — Inpatient Hospital Stay: Payer: Medicare HMO | Attending: Medical Oncology

## 2023-02-24 ENCOUNTER — Telehealth: Payer: Self-pay

## 2023-02-24 NOTE — Patient Outreach (Signed)
  Care Coordination   02/24/2023 Name: Brandon Joyce MRN: [id number] DOB: 1950-04-07   Care Coordination Outreach Attempts:  An unsuccessful telephone outreach was attempted today to offer the patient information about available complex care management services.  Follow Up Plan:  Additional outreach attempts will be made to offer the patient complex care management information and services.   Encounter Outcome:  No Answer   Care Coordination Interventions:  No, not indicated    Wilbert Weyman PEAK, BSN, [employer] [place]  [facility], [facility]  Care Coordinator Phone: [phone]

## 2023-04-08 ENCOUNTER — Other Ambulatory Visit (HOSPITAL_COMMUNITY)
Admission: RE | Admit: 2023-04-08 | Discharge: 2023-04-08 | Disposition: A | Discharge status: Home / Self Care | Payer: Medicare HMO | Source: Ambulatory Visit | Attending: Infectious Diseases | Admitting: Infectious Diseases

## 2023-04-08 ENCOUNTER — Ambulatory Visit (INDEPENDENT_AMBULATORY_CARE_PROVIDER_SITE_OTHER): Payer: Medicare HMO | Admitting: Infectious Diseases

## 2023-04-08 ENCOUNTER — Encounter: Payer: Medicare HMO | Admitting: Infectious Diseases

## 2023-04-08 ENCOUNTER — Other Ambulatory Visit: Payer: Self-pay

## 2023-04-08 VITALS — BP 108/68 | HR 67 | Temp 97.6°F | Resp 32 | Ht 68.0 in | Wt 221.8 lb

## 2023-04-08 DIAGNOSIS — I5032 Chronic diastolic (congestive) heart failure: Secondary | ICD-10-CM

## 2023-04-08 DIAGNOSIS — Z113 Encounter for screening for infections with a predominantly sexual mode of transmission: Secondary | ICD-10-CM

## 2023-04-08 DIAGNOSIS — I110 Hypertensive heart disease with heart failure: Secondary | ICD-10-CM

## 2023-04-08 DIAGNOSIS — I10 Essential (primary) hypertension: Secondary | ICD-10-CM | POA: Diagnosis not present

## 2023-04-08 DIAGNOSIS — J4521 Mild intermittent asthma with (acute) exacerbation: Secondary | ICD-10-CM

## 2023-04-08 DIAGNOSIS — B20 Human immunodeficiency virus [HIV] disease: Secondary | ICD-10-CM | POA: Diagnosis not present

## 2023-04-08 DIAGNOSIS — Z23 Encounter for immunization: Secondary | ICD-10-CM | POA: Diagnosis not present

## 2023-04-08 MED ORDER — HYDRALAZINE HCL 50 MG PO TABS: 50.0000 mg | ORAL_TABLET | Freq: Three times a day (TID) | ORAL | 3 refills | Status: AC

## 2023-04-08 MED ORDER — AMLODIPINE BESYLATE 10 MG PO TABS
10.0000 mg | ORAL_TABLET | Freq: Every day | ORAL | 3 refills | Status: AC
Start: 2023-04-08 — End: 2024-04-02

## 2023-04-08 NOTE — Assessment & Plan Note (Signed)
I refilled his BP rx I did not see a "fluid pill" except for spironolactone.  Will get him back in with Dr Jens Som.

## 2023-04-08 NOTE — Addendum Note (Signed)
Addended by: Alinda Sierras on: 04/08/2023 03:45 PM   Modules accepted: Orders

## 2023-04-08 NOTE — Progress Notes (Signed)
Subjective:    Patient ID: Brandon Joyce, male  DOB: [birth year], 73 y.o.        MRN: [id number]   HPI 73 yo M with HIV+, statin related myalgias, hyperlipidemia, erectile dysfunction (penoplasty), hypertension, ETOH use.  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 TTE 65-70% Grade 2 diastolic dysfunction (12-2021). He has thoracic aortic aneurysm as well, repeat CTA 01-2022.  Had another work related injury fall 2018, C6 pinched nerve.  He had a nerve block which helped for ~ 48h. He had c-spine surgery 10-23-17.  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  No problems with ART>    Wife is WC bound, prev bed-sore resolved. Has home health nurse helping. Never smoker, wife smokes. Has concerns about mother with continence issues and falls (none recently). Has a walker now. Currently having issues with vertigo. Has had EEG monitoring as well.  Has help from his sister. Dad deceased 09-22-2021.   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  He was started on eliquis. He's not clear when his stop date is.   States he has had lung fxn testing (obstructive lung dx), breathing test, told he has airway spasm.  He has also been seen by ENT for nasal obstruction- given flonase, mupirocin (not a surgical candidate). No procedures due to his eliquis. He had f/u u/s which "looked good".  He has recently seen Dr Heber Fair Play at [facility] for his asthma.  Has nebulizer at home, supposed to be using bid (not using).      HIV 1 RNA Quant  Date Value  05/30/2021 NOT DETECTED copies/mL  03/08/2021 Not Detected Copies/mL  05/21/2020 Not Detected Copies/mL   HIV-1 RNA Viral Load (copies/mL)  Date Value  10/21/2022 40  03/04/2022 <20   CD4 T Cell Abs (/uL)  Date Value  10/21/2022 660  05/30/2021 612  05/21/2020 627     Health Maintenance  Topic Date Due    Zoster Vaccines- Shingrix (1 of 2) Never done   Medicare Annual Wellness (AWV)  10/01/2023   Colonoscopy  07/18/2026   DTaP/Tdap/Td (2 - Td or Tdap) 12/30/2026   Pneumonia Vaccine 60+ Years old  Completed   INFLUENZA VACCINE  Completed   Hepatitis C Screening  Completed   HPV VACCINES  Aged Out   COVID-19 Vaccine  Discontinued      Review of Systems  Constitutional:  Negative for chills, fever and weight loss.  Respiratory:  Negative for cough and shortness of breath.   Cardiovascular:  Positive for leg swelling.  Gastrointestinal:  Negative for blood in stool, constipation and diarrhea.  Genitourinary:  Positive for frequency. Negative for dysuria.    Please see HPI. All other systems reviewed and negative.     Objective:  Physical Exam Vitals reviewed.  Constitutional:      General: He is not in acute distress.    Appearance: He is not toxic-appearing.  HENT:     Mouth/Throat:     Mouth: Mucous membranes are moist.     Pharynx: No oropharyngeal exudate.  Eyes:     Extraocular Movements: Extraocular movements intact.     Pupils: Pupils are equal, round, and reactive to light.  Cardiovascular:     Rate and Rhythm: Normal rate and regular rhythm.  Pulmonary:     Effort:  Pulmonary effort is normal.     Breath sounds: Normal breath sounds.  Abdominal:     General: Bowel sounds are normal. There is no distension.     Palpations: Abdomen is soft.     Tenderness: There is no abdominal tenderness.  Musculoskeletal:     Cervical back: Normal range of motion and neck supple.     Right lower leg: Edema present.     Left lower leg: Edema present.  Neurological:     Mental Status: He is alert.  Psychiatric:        Mood and Affect: Mood normal.            Assessment & Plan:

## 2023-04-08 NOTE — Assessment & Plan Note (Signed)
Appears to be doing well Will check his labs today.  PCV 20 today Will see him back in 4 months.  Will goto shingles vax at [facility].

## 2023-04-08 NOTE — Assessment & Plan Note (Signed)
Currently well controlled.  Appreciate Dr Alphonzo Lemmings f/u.

## 2023-04-09 LAB — URINE CYTOLOGY ANCILLARY ONLY
Chlamydia: NEGATIVE
Comment: NEGATIVE
Comment: NORMAL
Neisseria Gonorrhea: NEGATIVE

## 2023-04-09 LAB — T-HELPER CELLS (CD4) COUNT (NOT AT ARMC)
CD4 % Helper T Cell: 38 % (ref 33–65)
CD4 T Cell Abs: 658 /uL (ref 400–1790)

## 2023-04-10 LAB — COMPREHENSIVE METABOLIC PANEL
ALT: 29 [IU]/L (ref 0–44)
AST: 21 [IU]/L (ref 0–40)
Albumin: 4.6 g/dL (ref 3.8–4.8)
Alkaline Phosphatase: 64 [IU]/L (ref 44–121)
BUN/Creatinine Ratio: 11 (ref 10–24)
BUN: 12 mg/dL (ref 8–27)
Bilirubin Total: 0.3 mg/dL (ref 0.0–1.2)
CO2: 18 mmol/L — ABNORMAL LOW (ref 20–29)
Calcium: 9.4 mg/dL (ref 8.6–10.2)
Chloride: 104 mmol/L (ref 96–106)
Creatinine, Ser: 1.13 mg/dL (ref 0.76–1.27)
Globulin, Total: 2.3 g/dL (ref 1.5–4.5)
Glucose: 104 mg/dL — ABNORMAL HIGH (ref 70–99)
Potassium: 4.1 mmol/L (ref 3.5–5.2)
Sodium: 139 mmol/L (ref 134–144)
Total Protein: 6.9 g/dL (ref 6.0–8.5)
eGFR: 69 mL/min/{1.73_m2} (ref >59)

## 2023-04-10 LAB — LIPID PANEL
Chol/HDL Ratio: 3.5 {ratio} (ref 0.0–5.0)
Cholesterol, Total: 140 mg/dL (ref 100–199)
HDL: 40 mg/dL (ref >39)
LDL Chol Calc (NIH): 68 mg/dL (ref 0–99)
Triglycerides: 192 mg/dL — ABNORMAL HIGH (ref 0–149)
VLDL Cholesterol Cal: 32 mg/dL (ref 5–40)

## 2023-04-10 LAB — CBC WITH DIFFERENTIAL/PLATELET
Basophils Absolute: 0 10*3/uL (ref 0.0–0.2)
Basos: 1 %
EOS (ABSOLUTE): 0.2 10*3/uL (ref 0.0–0.4)
Eos: 4 %
Hematocrit: 45.5 % (ref 37.5–51.0)
Hemoglobin: 15.4 g/dL (ref 13.0–17.7)
Immature Grans (Abs): 0 10*3/uL (ref 0.0–0.1)
Immature Granulocytes: 1 %
Lymphocytes Absolute: 1.7 10*3/uL (ref 0.7–3.1)
Lymphs: 28 %
MCH: 30 pg (ref 26.6–33.0)
MCHC: 33.8 g/dL (ref 31.5–35.7)
MCV: 89 fL (ref 79–97)
Monocytes Absolute: 0.5 10*3/uL (ref 0.1–0.9)
Monocytes: 9 %
Neutrophils Absolute: 3.5 10*3/uL (ref 1.4–7.0)
Neutrophils: 57 %
Platelets: 265 10*3/uL (ref 150–450)
RBC: 5.13 x10E6/uL (ref 4.14–5.80)
RDW: 13.4 % (ref 11.6–15.4)
WBC: 6 10*3/uL (ref 3.4–10.8)

## 2023-04-10 LAB — HIV-1 RNA QUANT-NO REFLEX-BLD
HIV-1 RNA Viral Load Log: 1.903 {Log_copies}/mL
HIV-1 RNA Viral Load: 80 {copies}/mL

## 2023-04-10 LAB — RPR

## 2023-04-21 ENCOUNTER — Ambulatory Visit: Payer: Self-pay

## 2023-04-21 NOTE — Patient Outreach (Signed)
 Care Coordination   04/21/2023 Name: Brandon Joyce MRN: [id number] DOB: 09/10/1950   Care Coordination Outreach Attempts:  An unsuccessful telephone outreach was attempted today to offer the patient information about available complex care management services.  Follow Up Plan:  Additional outreach attempts will be made to offer the patient complex care management information and services.   Encounter Outcome:  No Answer   Care Coordination Interventions:  No, not indicated    Lonzo Candy, BSN, [facility] [place]  [facility], [facility]  Care Coordinator Phone: [phone]

## 2023-07-29 NOTE — Progress Notes (Signed)
 HPI: FU hypertension. Cardiac catheterization June 2015 showed minimal nonobstructive coronary disease and normal LV function. Had atenolol  DCed previously due to bradycardia.  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  He was treated with Eliquis .  Chest CT at [facility] in October 2024 showed severe coronary calcifications.  Echocardiogram October 2024 at [facility] showed normal LV function, grade 1 diastolic dysfunction.  Nuclear study November 2024 at [facility] showed ejection fraction 60% but no ischemia or infarction.  Since last seen, he does have some dyspnea on exertion.  No orthopnea or PND.  Minimal pedal edema.  No significant exertional chest pain.  Current Outpatient Medications  Medication Sig Dispense Refill   albuterol  (PROVENTIL ) (2.5 MG/3ML) 0.083% nebulizer solution Take 3 mLs (2.5 mg total) by nebulization every 4 (four) hours as needed for wheezing or shortness of breath. 75 mL 2   albuterol  (VENTOLIN  HFA) 108 (90 Base) MCG/ACT inhaler Inhale 2 puffs into the lungs every 6 (six) hours as needed for wheezing or shortness of breath. 8 g 2   amLODipine  (NORVASC ) 10 MG tablet Take 1 tablet (10 mg total) by mouth daily. 90 tablet 3   ascorbic acid (VITAMIN C) 1000 MG tablet Take 1,000 mg by mouth daily.     atorvastatin  (LIPITOR) 20 MG tablet Take 1 tablet by mouth once daily 30 tablet 0   azelastine  (ASTELIN ) 0.1 % nasal spray Place 1 spray into both nostrils daily. Use in each nostril as directed 30 mL 12   BIKTARVY  50-200-25 MG TABS tablet TAKE 1 TABLET DAILY 90 tablet 3   Cholecalciferol 50 MCG (2000 UT) CAPS Take 2,000 Units by mouth daily at 6 (six) AM.     ELIQUIS  5 MG TABS tablet Take 1 tablet by mouth twice daily 60 tablet 0   fluticasone -salmeterol (WIXELA INHUB) 100-50 MCG/ACT AEPB Inhale 1 puff into the lungs 2 (two) times daily. 60 each 2   hydrALAZINE  (APRESOLINE ) 50 MG tablet Take 1 tablet (50 mg total) by mouth 3 (three) times daily. 270  tablet 3   ipratropium (ATROVENT) 0.02 % nebulizer solution Take 2.5 mLs (0.5 mg total) by nebulization every 4 (four) hours as needed for wheezing or shortness of breath. 75 mL 2   fluticasone  (FLONASE ) 50 MCG/ACT nasal spray Place 1 spray into both nostrils daily. 18.2 mL 2   oxyCODONE  (ROXICODONE ) 5 MG immediate release tablet Take 1 tablet (5 mg total) by mouth every 4 (four) hours as needed for severe pain. (Patient not taking: Reported on 11/13/2022) 15 tablet 0   oxyCODONE -acetaminophen  (PERCOCET/ROXICET) 5-325 MG tablet Take 1 tablet by mouth every 8 (eight) hours as needed for severe pain. (Patient not taking: Reported on 11/04/2022) 6 tablet 0   oxymetazoline (AFRIN) 0.05 % nasal spray Place 1 spray into both nostrils Once PRN for congestion. (Patient not taking: Reported on 11/04/2022)     spironolactone  (ALDACTONE ) 25 MG tablet Take 0.5 tablets (12.5 mg total) by mouth daily. (Patient not taking: Reported on 07/17/2022) 90 tablet 3   No current facility-administered medications for this visit.     Past Medical History:  Diagnosis Date   Alcoholism (HCC)    Chronic kidney disease    followed by nephro   ED (erectile dysfunction)    HIV disease (HCC)    HLD (hyperlipidemia)    HTN (hypertension)    Pneumonia    Strain of neck muscle 03/23/2017    Past Surgical History:  Procedure Laterality Date  BACK SURGERY     INCISION AND DRAINAGE ABSCESS N/A 12/07/2019   Procedure: INCISION AND DRAINAGE BUTTOCK ABSCESS;  Surgeon: Caralyn Chandler, MD;  Location: [facility];  Service: General;  Laterality: N/A;   LEFT HEART CATHETERIZATION WITH CORONARY ANGIOGRAM N/A 07/28/2013   Procedure: LEFT HEART CATHETERIZATION WITH CORONARY ANGIOGRAM;  Surgeon: Lucendia Rusk, MD;  Location[facility] C[facility];  Service: Cardiovascular;  Laterality: N/A;   MASS EXCISION Left 01/25/2020   Procedure: 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;  Surgeon: Caralyn Chandler, MD;  Location:  [facility];  Service: General;  Laterality: Left;   ROTATOR CUFF REPAIR Right 04-27-2015   TONSILLECTOMY      Social History   Socioeconomic History   Marital status: Married    Spouse name: Evalyn Hillier   Number of children: 3   Years of education: Not on file   Highest education level: GED or equivalent  Occupational History    Employer: [employer]  Tobacco Use   Smoking status: Never   Smokeless tobacco: Never  Vaping Use   Vaping status: Never Used  Substance and Sexual Activity   Alcohol use: Yes    Alcohol/week: 21.0 standard drinks of alcohol    Types: 21 Standard drinks or equivalent per week    Comment: beer and whiskey - has increased   Drug use: No   Sexual activity: Not Currently    Partners: Female    Birth control/protection: Condom    Comment: accepted condoms  Other Topics Concern   Not on file  Social History Narrative   Not on file   Social Drivers of Health   Financial Resource Strain: Low Risk  (02/06/2023)   Received from [facility]   Overall Financial Resource Strain (CARDIA)    Difficulty of Paying Living Expenses: Not hard at all  Food Insecurity: No Food Insecurity (02/06/2023)   Received from [facility]   Hunger Vital Sign    Within the past 12 months, you worried that your food would run out before you got the money to buy more.: Never true    Within the past 12 months, the food you bought just didn't last and you didn't have money to get more.: Never true  Transportation Needs: No Transportation Needs (02/06/2023)   Received from [facility] - Transportation    Lack of Transportation (Medical): No    Lack of Transportation (Non-Medical): No  Physical Activity: Unknown (10/01/2022)   Exercise Vital Sign    Days of Exercise per Week: Not on file    Minutes of Exercise per Session: 0 min  Recent Concern: Physical Activity - Inactive (10/01/2022)   Exercise Vital Sign    Days of Exercise per Week: 3 days     Minutes of Exercise per Session: 0 min  Stress: No Stress Concern Present (10/01/2022)   [employer] of Occupational Health - Occupational Stress Questionnaire    Feeling of Stress : Only a little  Social Connections: Moderately Isolated (10/01/2022)   Social Connection and Isolation Panel    Frequency of Communication with Friends and Family: Three times a week    Frequency of Social Gatherings with Friends and Family: Once a week    Attends Religious Services: Never    Database administrator or Organizations: No    Attends Banker Meetings: Never    Marital Status: Married  Catering manager Violence: Not At Risk (10/21/2022)  Humiliation, Afraid, Rape, and Kick questionnaire    Fear of Current or Ex-Partner: No    Emotionally Abused: No    Physically Abused: No    Sexually Abused: No    Family History  Problem Relation Age of Onset   Diverticulitis Mother    Heart disease Mother    Atrial fibrillation Mother    Heart disease Father        CABG   Colon cancer Brother    Lung disease Neg Hx     ROS: no fevers or chills, productive cough, hemoptysis, dysphasia, odynophagia, melena, hematochezia, dysuria, hematuria, rash, seizure activity, orthopnea, PND, pedal edema, claudication. Remaining systems are negative.  Physical Exam: Well-developed well-nourished in no acute distress.  Skin is warm and dry.  HEENT is normal.  Neck is supple.  Chest is clear to auscultation with normal expansion.  Cardiovascular exam is regular rate and rhythm.  Abdominal exam nontender or distended. No masses palpated. Extremities show no edema. neuro grossly intact  EKG Interpretation Date/Time:  Tuesday August 11 2023 11:03:53 EDT Ventricular Rate:  70 PR Interval:  166 QRS Duration:  110 QT Interval:  396 QTC Calculation: 427 R Axis:   -22  Text Interpretation: Normal sinus rhythm  Lateral TWI. Cannot rule out Septal infarct Confirmed by Alexandria Angel ([id number]) on  08/11/2023 11:10:21 AM    A/P  1 hypertension-patient's blood pressure is controlled.  Continue present medications.  2 history of thoracic aortic aneurysm-follow-up CTA August 2024 at [facility] showed no aneurysmal dilatation or dissection of the aorta.  3 hyperlipidemia-continue statin.  4 HIV - managed by primary care.  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  6 coronary calcification-he is not having exertional chest pain.  Plan to continue statin.  He is not on aspirin  given need for apixaban .  Alexandria Angel, MD

## 2023-08-03 ENCOUNTER — Other Ambulatory Visit: Payer: Self-pay | Admitting: Infectious Diseases

## 2023-08-03 DIAGNOSIS — J4521 Mild intermittent asthma with (acute) exacerbation: Secondary | ICD-10-CM

## 2023-08-03 NOTE — Telephone Encounter (Unsigned)
 Copied from CRM [id number]. Topic: Clinical - Medication Refill >> Aug 03, 2023 12:21 PM Shamecia H wrote: Medication: albuterol  (VENTOLIN  HFA) 108 (90 Base) MCG/ACT inhaler  Has the patient contacted their pharmacy? Yes (Agent: If no, request that the patient contact the pharmacy for the refill. If patient does not wish to contact the pharmacy document the reason why and proceed with request.) (Agent: If yes, when and what did the pharmacy advise?)  This is the patient's preferred pharmacy:  [id number] - [place], [place] - [address] [place] [address] Phone: [phone] Fax: [phone]  Is this the correct pharmacy for this prescription? Yes If no, delete pharmacy and type the correct one.   Has the prescription been filled recently? Yes  Is the patient out of the medication? Yes  Has the patient been seen for an appointment in the last year OR does the patient have an upcoming appointment? Yes  Can we respond through MyChart? Yes  Agent: Please be advised that Rx refills may take up to 3 business days. We ask that you follow-up with your pharmacy.

## 2023-08-04 NOTE — Telephone Encounter (Signed)
 Attempts to call patient to see if he has seen his Pulmonary doctor.

## 2023-08-05 MED ORDER — ALBUTEROL SULFATE HFA 108 (90 BASE) MCG/ACT IN AERS: 2.0000 | INHALATION_SPRAY | Freq: Four times a day (QID) | RESPIRATORY_TRACT | 2 refills | Status: DC | PRN

## 2023-08-05 NOTE — Telephone Encounter (Signed)
 Patient doesn't have a PCP documented; albuterol  last filled a year ago by his ID physician.  Will refill now but he should either establish with a PCP or f/u for lung issues with pulmonologist to ensure appropriate monitoring. JW 08/05/23

## 2023-08-11 ENCOUNTER — Telehealth: Payer: Self-pay | Admitting: *Deleted

## 2023-08-11 ENCOUNTER — Other Ambulatory Visit: Payer: Self-pay | Admitting: Infectious Diseases

## 2023-08-11 ENCOUNTER — Ambulatory Visit: Payer: Medicare HMO | Attending: Cardiology | Admitting: Cardiology

## 2023-08-11 ENCOUNTER — Encounter: Payer: Self-pay | Admitting: Cardiology

## 2023-08-11 VITALS — BP 110/74 | HR 70 | Ht 68.0 in | Wt 216.2 lb

## 2023-08-11 DIAGNOSIS — E7800 Pure hypercholesterolemia, unspecified: Secondary | ICD-10-CM

## 2023-08-11 DIAGNOSIS — I2510 Atherosclerotic heart disease of native coronary artery without angina pectoris: Secondary | ICD-10-CM

## 2023-08-11 DIAGNOSIS — I10 Essential (primary) hypertension: Secondary | ICD-10-CM | POA: Diagnosis not present

## 2023-08-11 DIAGNOSIS — J4521 Mild intermittent asthma with (acute) exacerbation: Secondary | ICD-10-CM

## 2023-08-11 MED ORDER — ALBUTEROL SULFATE (2.5 MG/3ML) 0.083% IN NEBU: 2.5000 mg | INHALATION_SOLUTION | RESPIRATORY_TRACT | 2 refills | Status: DC | PRN

## 2023-08-11 MED ORDER — ALBUTEROL SULFATE HFA 108 (90 BASE) MCG/ACT IN AERS
2.0000 | INHALATION_SPRAY | Freq: Four times a day (QID) | RESPIRATORY_TRACT | 2 refills | Status: DC | PRN
Start: 2023-08-11 — End: 2023-12-30

## 2023-08-11 MED ORDER — IPRATROPIUM BROMIDE 0.02 % IN SOLN: 0.5000 mg | RESPIRATORY_TRACT | 2 refills | Status: DC | PRN

## 2023-08-11 NOTE — Patient Instructions (Signed)
   Follow-Up: At [facility], you and your health needs are our priority.  As part of our continuing mission to provide you with exceptional heart care, our providers are all part of one team.  This team includes your primary Cardiologist (physician) and Advanced Practice Providers or APPs (Physician Assistants and Nurse Practitioners) who all work together to provide you with the care you need, when you need it.  Your next appointment:   6 month(s)  Provider:   ANY APP      Then, Alexandria Angel, MD will plan to see you again in 12 month(s).

## 2023-08-11 NOTE — Telephone Encounter (Signed)
 Patient dropped by the Clinics today on his way to Cardiology appointment.  Needed refill on his Ipratropium/Albuterol  Nebulizer Solution.  Not on med list.  Patient had referral to Pulmonology-spoke with office stated did nod need there services Has not established with a Pulmonary Physician as of note.

## 2023-08-26 ENCOUNTER — Ambulatory Visit: Admitting: Infectious Diseases

## 2023-08-26 ENCOUNTER — Other Ambulatory Visit: Payer: Self-pay

## 2023-08-26 VITALS — BP 102/66 | HR 99 | Temp 99.5°F | Resp 28 | Ht 68.0 in | Wt 221.8 lb

## 2023-08-26 DIAGNOSIS — I110 Hypertensive heart disease with heart failure: Secondary | ICD-10-CM

## 2023-08-26 DIAGNOSIS — F1020 Alcohol dependence, uncomplicated: Secondary | ICD-10-CM | POA: Diagnosis not present

## 2023-08-26 DIAGNOSIS — I5032 Chronic diastolic (congestive) heart failure: Secondary | ICD-10-CM

## 2023-08-26 DIAGNOSIS — Z79899 Other long term (current) drug therapy: Secondary | ICD-10-CM

## 2023-08-26 DIAGNOSIS — I82442 Acute embolism and thrombosis of left tibial vein: Secondary | ICD-10-CM

## 2023-08-26 DIAGNOSIS — B20 Human immunodeficiency virus [HIV] disease: Secondary | ICD-10-CM

## 2023-08-26 DIAGNOSIS — Z113 Encounter for screening for infections with a predominantly sexual mode of transmission: Secondary | ICD-10-CM

## 2023-08-26 DIAGNOSIS — I10 Essential (primary) hypertension: Secondary | ICD-10-CM

## 2023-08-26 NOTE — Assessment & Plan Note (Signed)
 His BP is good today He is on spiro,  Encourage pt to keep legs up Appreciate eval.

## 2023-08-26 NOTE — Assessment & Plan Note (Signed)
 Still drinking 3-4 beers/day. Encourage to decrease.

## 2023-08-26 NOTE — Assessment & Plan Note (Signed)
 resolved . Stop eliquis .

## 2023-08-26 NOTE — Assessment & Plan Note (Signed)
 -  well controlled today

## 2023-08-26 NOTE — Assessment & Plan Note (Signed)
 He is doing well Had PCV20 given condoms.  No change in ART.  Will see back in 4 months.

## 2023-08-26 NOTE — Progress Notes (Signed)
 Subjective:    Patient ID: Brandon Joyce, male  DOB: Aug 29, 1950, 73 y.o.        MRN: [id number]   HPI 73 yo M with HIV+, statin related myalgias, hyperlipidemia, erectile dysfunction (penoplasty), hypertension, ETOH use.  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 TTE 65-70% Grade 2 diastolic dysfunction (12-2021). He has thoracic aortic aneurysm as well, repeat CTA 01-2022.  Had another work related injury fall 09-28-2016, C6 pinched nerve.  He had a nerve block which helped for ~ 48h. He had c-spine surgery 10-23-17.  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  No change in this.     Atripla ---> biktarvy  (12-2016).  No problems with ART    Wife is WC bound, prev bed-sore resolved. Has home health nurse helping. She has moved in with their daughter after infidelity.    Has concerns about mother with continence issues and falls (none recently). Has a walker now. Currently having issues with vertigo. Has had EEG monitoring as well.  Has help from his sister. Dad deceased 28-Sep-2021.   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  He was started on eliquis . He's not clear when his stop date is.    States he has had lung fxn testing (obstructive lung dx), breathing test, told he has airway spasm.  He has also been seen by ENT for nasal obstruction- given flonase , mupirocin (not a surgical candidate). No procedures due to his eliquis . He had f/u u/s which looked good.  He has recently seen Dr Brent [facility]Quaid at [facility] for his asthma.   Breathing has been ok, has used his nebulizer sparingly. Uses relaxation techniques as well.    He had CV f/u last month and his BP was well controlled.   HIV 1 RNA Quant  Date Value  05/30/2021 NOT DETECTED copies/mL  03/08/2021 Not Detected Copies/mL  05/21/2020 Not Detected Copies/mL   HIV-1 RNA Viral Load (copies/mL)  Date Value  04/08/2023 80  10/21/2022  40  03/04/2022 <20   CD4 T Cell Abs (/uL)  Date Value  04/08/2023 658  10/21/2022 660  05/30/2021 612     Health Maintenance  Topic Date Due  . Zoster Vaccines- Shingrix (1 of 2) Never done  . Medicare Annual Wellness (AWV)  10/01/2023  . INFLUENZA VACCINE  09/25/2023  . Colonoscopy  07/18/2026  . DTaP/Tdap/Td (2 - Td or Tdap) 12/30/2026  . Pneumococcal Vaccine: 50+ Years  Completed  . Hepatitis B Vaccines  Completed  . Hepatitis C Screening  Completed  . HPV VACCINES  Aged Out  . Meningococcal B Vaccine  Aged Out  . COVID-19 Vaccine  Discontinued      Review of Systems  Constitutional:  Negative for chills, fever and weight loss.  Respiratory:  Positive for cough and shortness of breath.   Cardiovascular:  Negative for claudication and leg swelling.  Gastrointestinal:  Negative for constipation and diarrhea.  Genitourinary:  Negative for dysuria.  Psychiatric/Behavioral:  The patient does not have insomnia.     Please see HPI. All other systems reviewed and negative.     Objective:  Physical Exam Vitals reviewed.  Constitutional:      Appearance: Normal appearance. He is obese.  HENT:     Mouth/Throat:     Mouth: Mucous membranes are moist.     Pharynx: No oropharyngeal exudate.  Eyes:     Extraocular Movements: Extraocular movements intact.     Pupils: Pupils are equal, round, and reactive to light.  Cardiovascular:     Rate and Rhythm: Normal rate and regular rhythm.  Pulmonary:     Effort: Pulmonary effort is normal.     Breath sounds: Normal breath sounds.  Abdominal:     General: Bowel sounds are normal. There is no distension.     Palpations: Abdomen is soft.     Tenderness: There is no abdominal tenderness.  Musculoskeletal:        General: Normal range of motion.     Cervical back: Normal range of motion and neck supple.     Right lower leg: Edema present.     Left lower leg: Edema present.  Neurological:     General: No focal deficit  present.     Mental Status: He is alert.           Assessment & Plan:

## 2023-11-22 ENCOUNTER — Emergency Department (HOSPITAL_BASED_OUTPATIENT_CLINIC_OR_DEPARTMENT_OTHER)

## 2023-11-22 ENCOUNTER — Other Ambulatory Visit: Payer: Self-pay

## 2023-11-22 ENCOUNTER — Encounter (HOSPITAL_BASED_OUTPATIENT_CLINIC_OR_DEPARTMENT_OTHER): Payer: Self-pay

## 2023-11-22 ENCOUNTER — Emergency Department (HOSPITAL_BASED_OUTPATIENT_CLINIC_OR_DEPARTMENT_OTHER)
Admission: EM | Admit: 2023-11-22 | Discharge: 2023-11-22 | Disposition: A | Discharge status: Home / Self Care | Attending: Emergency Medicine | Admitting: Emergency Medicine

## 2023-11-22 DIAGNOSIS — Z79899 Other long term (current) drug therapy: Secondary | ICD-10-CM | POA: Insufficient documentation

## 2023-11-22 DIAGNOSIS — S76912A Strain of unspecified muscles, fascia and tendons at thigh level, left thigh, initial encounter: Secondary | ICD-10-CM | POA: Diagnosis not present

## 2023-11-22 DIAGNOSIS — W19XXXA Unspecified fall, initial encounter: Secondary | ICD-10-CM | POA: Diagnosis not present

## 2023-11-22 DIAGNOSIS — I10 Essential (primary) hypertension: Secondary | ICD-10-CM | POA: Insufficient documentation

## 2023-11-22 DIAGNOSIS — M79652 Pain in left thigh: Secondary | ICD-10-CM | POA: Diagnosis not present

## 2023-11-22 DIAGNOSIS — S86912A Strain of unspecified muscle(s) and tendon(s) at lower leg level, left leg, initial encounter: Secondary | ICD-10-CM | POA: Diagnosis not present

## 2023-11-22 DIAGNOSIS — M7989 Other specified soft tissue disorders: Secondary | ICD-10-CM | POA: Insufficient documentation

## 2023-11-22 DIAGNOSIS — M79605 Pain in left leg: Secondary | ICD-10-CM | POA: Diagnosis not present

## 2023-11-22 DIAGNOSIS — Z21 Asymptomatic human immunodeficiency virus [HIV] infection status: Secondary | ICD-10-CM | POA: Insufficient documentation

## 2023-11-22 DIAGNOSIS — S79922A Unspecified injury of left thigh, initial encounter: Secondary | ICD-10-CM | POA: Diagnosis present

## 2023-11-22 MED ORDER — LIDOCAINE 5 % EX PTCH
1.0000 | MEDICATED_PATCH | CUTANEOUS | Status: DC
  Administered 2023-11-22: 1 via TRANSDERMAL
  Filled 2023-11-22: qty 1

## 2023-11-22 MED ORDER — CYCLOBENZAPRINE HCL 10 MG PO TABS: 5.0000 mg | ORAL_TABLET | Freq: Every evening | ORAL | 0 refills | Status: AC | PRN

## 2023-11-22 MED ORDER — LIDOCAINE 5 % EX PTCH: 1.0000 | MEDICATED_PATCH | CUTANEOUS | 0 refills | Status: AC

## 2023-11-22 NOTE — Discharge Instructions (Addendum)
 The ultrasound of your left leg did not show any blood clot in your leg.  The pain in your leg seems to be due to a muscle strain.  Please engage in light physical activity (like walking) to prevent to prevent stiffness. Refrain from bedrest which can make your pain worse.   You may use up to 600mg  ibuprofen  every 6 hours as needed for pain.  Do not exceed 2.4g of ibuprofen  per day.    You may take up to 1000mg  of tylenol  every 6 hours as needed for pain.  Do not take more then 4g per day.    You may use a heating back on your leg to help with the pain.  You have been prescribed a muscle relaxer called Flexeril (cyclobenzaprine). You may take 0.5 - 1 tablet (5-10mg ) before bed as needed for muscle pain. This medication can be sedating. Do not drive or operate heavy machinery after taking this medicine. Do not drink alcohol or take other sedating medications when taking this medicine for safety reasons.  Keep this out of reach of small children.    You have been prescribed lidocaine  patched to help with pain. You may apply one patch to your left thigh for up to 12 hours at a time. Then, you must remove the patch for a full 12 hours before re-applying a new patch.    Please contact your PCP if your back pain does not start to improve over the next week as you may benefit from a PT referral from your PCP.  Your blood pressure was elevated here today at 162/110. Aim to get 30 minutes of exercise daily and limit intake of processed/fast foods. Please take and record your blood pressures at home. Take them at the same time every day. Follow up with your PCP within the next month to discuss if you may need treatment for your blood pressure.    Return to the ER if you have severe worsening pain, numbness in your leg, skin changes, any other new or concerning symptoms

## 2023-11-22 NOTE — ED Provider Notes (Signed)
 [place] [facility] [facility] Provider Note   CSN: [id number] Arrival date & time: 11/22/23  1149     Patient presents with: Leg Pain   Brandon Joyce is a 73 y.o. male with history of left lower extremity DVT, HIV, hypertension, presents with concern for pain in the left lateral thigh that started about a week ago.  Denies any known injuries but was moving trash into a dumpster just prior to pain starting.  Denies any numbness or weakness in the lower extremities.  He reports this felt somewhat similar to his previous DVT, and he wanted to be evaluated for possible blood clot.  Denies any chest pain or shortness of breath.    Leg Pain      Prior to Admission medications   Medication Sig Start Date End Date Taking? Authorizing Provider  cyclobenzaprine (FLEXERIL) 10 MG tablet Take 0.5-1 tablets (5-10 mg total) by mouth at bedtime as needed for muscle spasms. 11/22/23  Yes Veta Palma, PA-C  lidocaine  (LIDODERM ) 5 % Place 1 patch onto the skin daily. Apply one patch to the thigh for up to 12 hours at a time. Then remove patch for a full 12 hours before reapplying a new patch 11/22/23  Yes Veta Palma, PA-C  albuterol  (PROVENTIL ) (2.5 MG/3ML) 0.083% nebulizer solution Take 3 mLs (2.5 mg total) by nebulization every 4 (four) hours as needed for wheezing or shortness of breath. 08/11/23 08/10/24  Eben Reyes BROCKS, MD  albuterol  (VENTOLIN  HFA) 108 (90 Base) MCG/ACT inhaler Inhale 2 puffs into the lungs every 6 (six) hours as needed for wheezing or shortness of breath. 08/11/23   Eben Reyes BROCKS, MD  amLODipine  (NORVASC ) 10 MG tablet Take 1 tablet (10 mg total) by mouth daily. 04/08/23 04/02/24  Eben Reyes BROCKS, MD  ascorbic acid (VITAMIN C) 1000 MG tablet Take 1,000 mg by mouth daily.    [provider]  atorvastatin  (LIPITOR) 20 MG tablet Take 1 tablet by mouth once daily 12/14/20   Pietro Redell RAMAN, MD  azelastine  (ASTELIN ) 0.1 % nasal spray  Place 1 spray into both nostrils daily. Use in each nostril as directed 07/17/22   Meade Verdon RAMAN, MD  BIKTARVY  50-200-25 MG TABS tablet TAKE 1 TABLET DAILY 01/19/23   Eben Reyes BROCKS, MD  Cholecalciferol 50 MCG (2000 UT) CAPS Take 2,000 Units by mouth daily at 6 (six) AM.    [provider]  fluticasone  (FLONASE ) 50 MCG/ACT nasal spray Place 1 spray into both nostrils daily. 07/17/22 07/17/23  Desai, Nikita S, MD  fluticasone -salmeterol (WIXELA INHUB) 100-50 MCG/ACT AEPB Inhale 1 puff into the lungs 2 (two) times daily. 06/18/22   Eben Reyes BROCKS, MD  hydrALAZINE  (APRESOLINE ) 50 MG tablet Take 1 tablet (50 mg total) by mouth 3 (three) times daily. 04/08/23   Eben Reyes BROCKS, MD  ipratropium (ATROVENT ) 0.02 % nebulizer solution Take 2.5 mLs (0.5 mg total) by nebulization every 4 (four) hours as needed for wheezing or shortness of breath. 08/11/23 08/10/24  Eben Reyes BROCKS, MD  oxyCODONE  (ROXICODONE ) 5 MG immediate release tablet Take 1 tablet (5 mg total) by mouth every 4 (four) hours as needed for severe pain. Patient not taking: Reported on 11/13/2022 09/22/22   Yolande Lamar BROCKS, MD  oxyCODONE -acetaminophen  (PERCOCET/ROXICET) 5-325 MG tablet Take 1 tablet by mouth every 8 (eight) hours as needed for severe pain. Patient not taking: Reported on 11/04/2022 09/12/22   Neldon Hamp RAMAN, PA  oxymetazoline (AFRIN) 0.05 % nasal spray Place  1 spray into both nostrils Once PRN for congestion. Patient not taking: Reported on 11/04/2022    [provider]  spironolactone  (ALDACTONE ) 25 MG tablet Take 0.5 tablets (12.5 mg total) by mouth daily. Patient not taking: Reported on 07/17/2022 05/09/22 08/07/22  Jerilynn Lamarr HERO, NP    Allergies: Other; Sulfa antibiotics; Sulfacetamide sodium; Sulfasalazine; Atenolol ; Isosorbide; Nitrates, organic; and Pentaerythritol tetranitrate    Review of Systems  Musculoskeletal:        Left thigh pain    Updated Vital Signs BP (!) 162/110   Pulse  70   Temp 98.3 F (36.8 C)   Resp 18   Wt 97.1 kg   SpO2 94%   BMI 32.54 kg/m   Physical Exam Vitals and nursing note reviewed.  Constitutional:      Appearance: Normal appearance.  HENT:     Head: Atraumatic.  Cardiovascular:     Comments: 2+ pedal pulses in the left lower extremity Pulmonary:     Effort: Pulmonary effort is normal.  Musculoskeletal:       Legs:     Comments: Left lower extremity:  General No obvious deformity. No erythema, edema, contusions, open wounds   Palpation Tender to the musculature over the left lateral thigh.  I do not appreciate any swollen or varicose veins, cystic structures, or any other abnormalities. Non tender over the femur, tibia, patella.  ROM Full hip flexion extension, knee flexion extension, ankle plantarflexion and dorsiflexion bilaterally. Ambulates without difficulty   Neurological:     General: No focal deficit present.     Mental Status: He is alert.     Comments: Sensation: Sensation intact throughout the bilateral lower extremity  Strength: 5/5 strength with resisted knee flexion and extension bilaterally 5/5 strength with resisted ankle plantarflexion and dorsiflexion bilaterally  Psychiatric:        Mood and Affect: Mood normal.        Behavior: Behavior normal.     (all labs ordered are listed, but only abnormal results are displayed) Labs Reviewed - No data to display  EKG: None  Radiology: US  Venous Img Lower  Left (DVT Study) Result Date: 11/22/2023 CLINICAL DATA:  pain EXAM: LEFT LOWER EXTREMITY VENOUS DOPPLER ULTRASOUND TECHNIQUE: Gray-scale sonography with compression, as well as color and duplex ultrasound, were performed to evaluate the deep venous system(s) from the level of the common femoral vein through the popliteal and proximal calf veins. COMPARISON:  D September 11, 2022. FINDINGS: VENOUS Normal compressibility of the common femoral, superficial femoral, and popliteal veins, as well as the  visualized calf veins. Visualized portions of profunda femoral vein and great saphenous vein unremarkable. No filling defects to suggest DVT on grayscale or color Doppler imaging. Doppler waveforms show normal direction of venous flow, normal respiratory plasticity and response to augmentation. Limited views of the contralateral common femoral vein are unremarkable. OTHER Targeted ultrasound was performed of the site of pain in the LEFT lateral thigh. No superficial suspicious cystic or solid mass is seen. Previously described calf thrombus is not visualized on today's exam. Limitations: none IMPRESSION: No evidence of acute LEFT lower extremity DVT. Electronically Signed   By: Corean Salter M.D.   On: 11/22/2023 13:10     Procedures   Medications Ordered in the ED  lidocaine  (LIDODERM ) 5 % 1 patch (1 patch Transdermal Patch Applied 11/22/23 1444)  Medical Decision Making Risk Prescription drug management.     Differential diagnosis includes but is not limited to thrombophlebitis, DVT, cellulitis, cyst, muscle strain, fracture, dislocation  ED Course:  Upon initial evaluation, patient is well-appearing, no acute distress.  Stable vitals aside from elevated blood pressure of 162/110 upon arrival.  Reporting pain to the left lateral thigh.  On exam, he has point tenderness over the musculature of the middle of the left lateral thigh.  No overlying skin change or wounds to suggest infection.  I do not appreciate any swollen or varicose veins to suggest phlebitis. He is neuro vastly intact in the bilateral lower extremities.  He does not have any tenderness of the left femur, ambulating without difficulty, no known injury, low concern for acute fracture.  DVT study of the left lower extremity without evidence of DVT.  Given location of pain over the lateral aspect of the thigh musculature, suspect muscle strain.  Stable and appropriate for discharge  home.  Imaging Studies ordered: I ordered imaging studies including ultrasound left lower extremity I independently visualized the imaging with scope of interpretation limited to determining acute life threatening conditions related to emergency care. Imaging showed no evidence of DVT I agree with the radiologist interpretation  Medications Given: Lidocaine  patch  Impression: Left thigh muscle strain  Disposition:  The patient was discharged home with instructions to take Tylenol  and ibuprofen  as needed for pain.  Lidocaine  patches as needed for pain.  Flexeril before bed as needed for pain.  He understands Flexeril may make him drowsy and do not drink alcohol or drive after taking this medication.  Follow-up with PCP if symptoms are not improving within the next week for recheck and possible physical therapy referral.  Follow-up with PCP within the next month regarding his elevated blood pressure. Return precautions given.    Record Review: External records from outside source obtained and reviewed including CMP from February 2025 with normal creatinine     This chart was dictated using voice recognition software, Dragon. Despite the best efforts of this provider to proofread and correct errors, errors may still occur which can change documentation meaning.       Final diagnoses:  Muscle strain of left thigh, initial encounter    ED Discharge Orders          Ordered    cyclobenzaprine (FLEXERIL) 10 MG tablet  At bedtime PRN        11/22/23 1447    lidocaine  (LIDODERM ) 5 %  Every 24 hours        11/22/23 1447               Veta Palma, PA-C 11/22/23 1455    Dreama Longs, MD 11/23/23 1038

## 2023-11-22 NOTE — ED Triage Notes (Signed)
 Pt concerned he may have another blood clot in left lateral thigh. Pain for 1 week Denies injury. Hx of blood clot. Pain feels the same in upper leg as it did when he had first clot in left lower leg. PCP discontinued blood thinner in June

## 2023-12-09 ENCOUNTER — Telehealth: Payer: Self-pay | Admitting: Cardiology

## 2023-12-09 ENCOUNTER — Other Ambulatory Visit: Payer: Self-pay | Admitting: Infectious Diseases

## 2023-12-09 DIAGNOSIS — J4521 Mild intermittent asthma with (acute) exacerbation: Secondary | ICD-10-CM

## 2023-12-09 NOTE — Telephone Encounter (Signed)
 Calling to get Dr. Pietro recommendation on a good lung dr. Please advise

## 2023-12-09 NOTE — Telephone Encounter (Unsigned)
 Copied from CRM #[id number]. Topic: Clinical - Medication Refill >> Dec 09, 2023  3:35 PM [name] wrote: Medication: albuterol  albuterol  (PROVENTIL ) (2.5 MG/3ML) 0.083% nebulizer solution   Has the patient contacted their pharmacy? Yes (Agent: If no, request that the patient contact the pharmacy for the refill. If patient does not wish to contact the pharmacy document the reason why and proceed with request.) (Agent: If yes, when and what did the pharmacy advise?)  This is the patient's preferred pharmacy:  [id number] - [place], [place] - [address] [place] [address] Phone: [phone] Fax: [phone]   Is this the correct pharmacy for this prescription? Yes If no, delete pharmacy and type the correct one.   Has the prescription been filled recently? No  Is the patient out of the medication? No  Has the patient been seen for an appointment in the last year OR does the patient have an upcoming appointment? Yes  Can we respond through MyChart? Yes  Agent: Please be advised that Rx refills may take up to 3 business days. We ask that you follow-up with your pharmacy.

## 2023-12-10 DIAGNOSIS — J4551 Severe persistent asthma with (acute) exacerbation: Secondary | ICD-10-CM | POA: Diagnosis not present

## 2023-12-10 DIAGNOSIS — E785 Hyperlipidemia, unspecified: Secondary | ICD-10-CM | POA: Diagnosis not present

## 2023-12-10 DIAGNOSIS — N1831 Chronic kidney disease, stage 3a: Secondary | ICD-10-CM | POA: Diagnosis not present

## 2023-12-10 DIAGNOSIS — Z7901 Long term (current) use of anticoagulants: Secondary | ICD-10-CM | POA: Diagnosis not present

## 2023-12-10 DIAGNOSIS — Z20822 Contact with and (suspected) exposure to covid-19: Secondary | ICD-10-CM | POA: Diagnosis not present

## 2023-12-10 DIAGNOSIS — J45901 Unspecified asthma with (acute) exacerbation: Secondary | ICD-10-CM | POA: Diagnosis not present

## 2023-12-10 DIAGNOSIS — Z7951 Long term (current) use of inhaled steroids: Secondary | ICD-10-CM | POA: Diagnosis not present

## 2023-12-10 DIAGNOSIS — I10 Essential (primary) hypertension: Secondary | ICD-10-CM | POA: Diagnosis not present

## 2023-12-10 DIAGNOSIS — R61 Generalized hyperhidrosis: Secondary | ICD-10-CM | POA: Diagnosis not present

## 2023-12-10 DIAGNOSIS — I2510 Atherosclerotic heart disease of native coronary artery without angina pectoris: Secondary | ICD-10-CM | POA: Diagnosis not present

## 2023-12-10 DIAGNOSIS — R0789 Other chest pain: Secondary | ICD-10-CM | POA: Diagnosis not present

## 2023-12-10 DIAGNOSIS — R0600 Dyspnea, unspecified: Secondary | ICD-10-CM | POA: Diagnosis not present

## 2023-12-10 DIAGNOSIS — R0602 Shortness of breath: Secondary | ICD-10-CM | POA: Diagnosis not present

## 2023-12-10 DIAGNOSIS — Z21 Asymptomatic human immunodeficiency virus [HIV] infection status: Secondary | ICD-10-CM | POA: Diagnosis not present

## 2023-12-10 DIAGNOSIS — R000 Tachycardia, unspecified: Secondary | ICD-10-CM | POA: Diagnosis not present

## 2023-12-10 DIAGNOSIS — I129 Hypertensive chronic kidney disease with stage 1 through stage 4 chronic kidney disease, or unspecified chronic kidney disease: Secondary | ICD-10-CM | POA: Diagnosis not present

## 2023-12-10 MED ORDER — ALBUTEROL SULFATE (2.5 MG/3ML) 0.083% IN NEBU: 2.5000 mg | INHALATION_SOLUTION | RESPIRATORY_TRACT | 2 refills | Status: AC | PRN

## 2023-12-10 NOTE — Discharge Summary (Signed)
 ------------------------------------------------------------------------------- Attestation signed by Derick Furnish, MD at 12/13/2023 11:47 AM Attending Attestation: I have reviewed the resident's discharge summary, examined the patient, and discussed the case with the care team. I concur with the documented assessment and plan. The patient's clinical status, diagnostic findings, and management strategy have been appropriately evaluated and addressed.  DC time 35 mins Electronically Signed by: Derick Furnish, MD,  Attending Physician, Internal Medicine / Hospital Medicine 12/13/2023 11:45 AM  -------------------------------------------------------------------------------  [facility] Gen Med 2 Discharge Summary  Name: Brandon Joyce MRN: [id number] Age: 73 yrs DOB: 1950-05-20  Admission: 12/10/2023 Admitting Physician: Derick Furnish, MD  Discharge: 12/12/23  Discharge Physician: Derick Furnish, MD   Admission Diagnoses:  Shortness of breath [R06.02] Acute asthma exacerbation (CMD) [J45.901] Chest pain, unspecified type [R07.9] Asthma exacerbation attacks (CMD) [J45.901]   Discharge Diagnoses:  Acute asthma exacberation Hypertension  Admission Condition: poor Discharged Condition: good  Indication for Hospitalization and Brief Hospital Course: For full details, please see H&P, progress notes, consult notes and ancillary notes. Briefly, Brandon Joyce is a 73 y.o. male with a medical history significant for CAD, HLD, CKD stage III, HIV on Biktarvy , asthma with recurrent exacerbations . He presented on 12/10/2023 with a chief complaint of SOB.  He was treated for this complaint. The details of his hospital stay are summarized below in a problem based format.   Asthma exacerbation Patient with a history of recurrent asthma exacerbation and follows pulmonary outpatient. Treated in the ED with DuoNebs x 3 and IV steroid with major improvements. Admitted for  observation. CXR was unremarkable. Unremarkable labs and covid/flu swab. VBG and eosinophils WNL. As needed DuoNeb was ordered. He was s/p 125 mg IV methylprednisolone  then continued on prednisone  40 mg daily. He had a persistent oxygen requirement on Oct-17, so additional studies for atypical organisms and a CT-PE were ordered. He was also given furosemide  trial with 40mg  IV x1. The CT-PE was negative for acute abnormality, and all studies were pending or negative at time of discharge. On Oct-18, patient was able to be weaned off O2, passing a walk test with O2 sat at or greater than 90%. Given his clinical improvement, patient was agreeable for discharge and sent home with follow-up to PCP. He was sent with the remainder of his prednisone  taper (3 additional days of pred 40mg ). He was continued on his home inhaler regimen.  HTN Patient home medication includes amlodipine  10 mg and hydralazine  50 mg TID.  Unknown reason why he is on this regimen. Admission BP elevated SBP 180. Had been resumed home medication amlodipine  and hydralazine  with PCP f/u outpatient.  The patient's chronic medical conditions were treated accordingly per the patient's home medication regimen.  The patient's medication reconciliation (with changes made to chronic medications), follow-up appointments, discharge orders, instructions and significant lab and diagnostic studies are noted below.   On the morning of Oct-18, the patient was deemed medically ready for discharge from the hospital. The hospital course and treatment plan going forward were discussed with the patient and his family, and all questions were answered.  The patient will be discharged home and was instructed to follow up as below.  He was hemodynamically stable and in no acute distress at the time of discharge.   Discharge Follow-up Action Items: Follow up visits: PCP in 1-2 weeks Please follow up on labs as appropriate Please ensure follow up on outstanding  labs: aspergillus, T-helper cell counts, histoplasma, blastomyces, coccidioides  Please ensure  subspecialty follow up as indicated. Pulmonology in 1-2 months Medication changes: Prednisone  40 mg PO daily x 3 days   Disposition: Home or Self Care home  Patient was seen and examined by the attending physician on day of discharge, and was deemed appropriate for discharge.    Patient's Ordered Code Status: Full Code  Consults: IP CONSULT TO HOSPITALIST  Discharge Orders     Full Code     Lifting Limits:     Details:    Lifting Limits: No lifting limits   Regular diet     Details:    Diet type: Regular       Patient's Ordered Code Status: Full Code  Consults: IP CONSULT TO HOSPITALIST  CBC:  Results from last 7 days  Lab Units 12/12/23 0131 12/11/23 0112 12/10/23 0321  WHITE BLOOD CELL COUNT 10*3/uL 11.49* 11.00 6.79  HEMOGLOBIN g/dL 84.4 84.8 83.9  HEMATOCRIT % 46.7 44.1 48.2  PLATELET COUNT 10*3/uL 247 253 248   CMP:  Results from last 7 days  Lab Units 12/12/23 0131 12/11/23 0112 12/10/23 0321  SODIUM mmol/L 135* 135* 136  POTASSIUM mmol/L 4.1 4.3 4.2  CHLORIDE mmol/L 103 103 103  CO2 mmol/L 24 23 25   BUN mg/dL 20 21 12   CREATININE mg/dL 8.78 8.78 8.91  CALCIUM  mg/dL 9.0 9.1 9.1  MAGNESIUM mg/dL 2.1 2.1  --   BILIRUBIN TOTAL mg/dL  --   --  0.4  AST U/L  --   --  26  ALT U/L  --   --  27  TOTAL PROTEIN g/dL  --   --  7.4  ALBUMIN g/dL  --   --  4.6  ANION GAP mmol/L 8 9 8     Disposition: Home or Self Care   Patient Instructions:    Discharge Medications     New Medications      Sig Disp Refill Start End  predniSONE  20 mg tablet Commonly known as: DELTASONE   Take 2 tablets (40 mg total) by mouth daily for 3 days.  6 tablet  0  December 13, 2023        Medications To Continue      Sig Disp Refill Start End  * albuterol  2.5 mg /3 mL (0.083 %) nebulizer solution  Take 2.5 mg by nebulization every 4 (four) hours as needed for  wheezing.   0     * albuterol  HFA 90 mcg/actuation inhaler Commonly known as: PROVENTIL  HFA;VENTOLIN  HFA;PROAIR  HFA  Inhale 2 puffs every 6 (six) hours as needed for wheezing.  1 each  0     amLODIPine  10 mg tablet Commonly known as: NORVASC   Take 10 mg by mouth daily.   0     ascorbic acid 1,000 mg tablet Commonly known as: VITAMIN C  Take 1,000 mg by mouth daily.   0     atorvastatin  20 mg tablet Commonly known as: LIPITOR  Take 20 mg by mouth at bedtime.   0     bictegrav-emtricit-tenofov ala 50-200-25 mg Tab per tablet Commonly known as: BIKTARVY   Take 1 tablet by mouth daily.   0     budesonide -formoteroL  160-4.5 mcg/actuation inhaler Commonly known as: Symbicort   Inhale 2 puffs  in the morning and 2 puffs before bedtime. And an additional 1 puff as needed for shortness of breath or wheezing; up to 4 as needed puffs as day..  10.2 g  11     hydrALAZINE  50 mg tablet Commonly known as: APRESOLINE   Take 50 mg by mouth 3 (three) times a day.   0     ipratropium-albuteroL  0.5-2.5 mg/3 mL nebulizer solution Commonly known as: DUO-NEB  Take 3 mL by nebulization every 6 (six) hours as needed for wheezing.  360 mL  0     montelukast 10 mg tablet Commonly known as: Singulair  Take 1 tablet (10 mg total) by mouth at bedtime.  90 tablet  3        * * There are duplicate medications prescribed to the patient           Discharge Orders     Full Code     Lifting Limits:     Details:    Lifting Limits: No lifting limits   Regular diet     Details:    Diet type: Regular      Follow-up  No future appointments.   To request medical records from this hospitalization, please refer to [email or web address] or call [phone].  Electronically signed by: Norleen Clem Apley, MD 12/12/2023    *Some images could not be shown.

## 2023-12-10 NOTE — Telephone Encounter (Signed)
Unable to reach pt or leave a message mailbox is full 

## 2023-12-11 DIAGNOSIS — R7989 Other specified abnormal findings of blood chemistry: Secondary | ICD-10-CM | POA: Diagnosis not present

## 2023-12-11 DIAGNOSIS — J9811 Atelectasis: Secondary | ICD-10-CM | POA: Diagnosis not present

## 2023-12-12 DIAGNOSIS — J4551 Severe persistent asthma with (acute) exacerbation: Secondary | ICD-10-CM | POA: Diagnosis not present

## 2023-12-14 ENCOUNTER — Telehealth: Payer: Self-pay

## 2023-12-14 NOTE — Transitions of Care (Post Inpatient/ED Visit) (Signed)
   12/14/2023  Name: Brandon Joyce MRN: [id number] DOB: 09-15-1950  Today's TOC FU Call Status: Today's TOC FU Call Status:: Unsuccessful Call (1st Attempt) Unsuccessful Call (1st Attempt) Date: 12/14/23  Attempted to reach the patient regarding the most recent Inpatient/ED visit.  Follow Up Plan: Additional outreach attempts will be made to reach the patient to complete the Transitions of Care (Post Inpatient/ED visit) call.   Medford Balboa, BSN, RN [place]  [facility] - [employer] [facility] RN Care Manager [phone]

## 2023-12-15 ENCOUNTER — Telehealth: Payer: Self-pay

## 2023-12-15 NOTE — Telephone Encounter (Signed)
Unable to reach pt or leave a message  

## 2023-12-15 NOTE — Transitions of Care (Post Inpatient/ED Visit) (Signed)
   12/15/2023  Name: Brandon Joyce MRN: [id number] DOB: [birth year]  Today's TOC FU Call Status: Today's TOC FU Call Status:: Unsuccessful Call (2nd Attempt) Unsuccessful Call (1st Attempt) Date: 12/14/23 Unsuccessful Call (2nd Attempt) Date: 12/15/23  Attempted to reach the patient regarding the most recent Inpatient/ED visit.  Follow Up Plan: Additional outreach attempts will be made to reach the patient to complete the Transitions of Care (Post Inpatient/ED visit) call.   Medford Balboa, BSN, RN [place]  [facility] - [employer] [facility] RN Care Manager [phone]

## 2023-12-16 ENCOUNTER — Telehealth: Payer: Self-pay

## 2023-12-16 NOTE — Transitions of Care (Post Inpatient/ED Visit) (Signed)
   12/16/2023  Name: Keigo Whalley MRN: [id number] DOB: 1951/01/31  Today's TOC FU Call Status: Today's TOC FU Call Status:: Unsuccessful Call (3rd Attempt) Unsuccessful Call (1st Attempt) Date: 12/14/23 Unsuccessful Call (2nd Attempt) Date: 12/15/23 Unsuccessful Call (3rd Attempt) Date: 12/16/23  Attempted to reach the patient regarding the most recent Inpatient/ED visit.  Follow Up Plan: No further outreach attempts will be made at this time. We have been unable to contact the patient.  Medford Balboa, BSN, RN [place]  [facility] - [employer] [facility] RN Care Manager [phone]

## 2023-12-24 NOTE — Telephone Encounter (Signed)
 Unable to reach pt or leave a message mailbox is fulkl.

## 2023-12-30 ENCOUNTER — Ambulatory Visit: Admitting: Infectious Diseases

## 2023-12-30 ENCOUNTER — Other Ambulatory Visit (HOSPITAL_COMMUNITY)
Admission: RE | Admit: 2023-12-30 | Discharge: 2023-12-30 | Disposition: A | Discharge status: Home / Self Care | Source: Ambulatory Visit | Attending: Infectious Diseases | Admitting: Infectious Diseases

## 2023-12-30 VITALS — BP 129/84 | HR 101 | Wt 214.0 lb

## 2023-12-30 DIAGNOSIS — J449 Chronic obstructive pulmonary disease, unspecified: Secondary | ICD-10-CM | POA: Diagnosis not present

## 2023-12-30 DIAGNOSIS — B20 Human immunodeficiency virus [HIV] disease: Secondary | ICD-10-CM

## 2023-12-30 DIAGNOSIS — G4700 Insomnia, unspecified: Secondary | ICD-10-CM | POA: Diagnosis not present

## 2023-12-30 DIAGNOSIS — Z23 Encounter for immunization: Secondary | ICD-10-CM

## 2023-12-30 DIAGNOSIS — Z79899 Other long term (current) drug therapy: Secondary | ICD-10-CM | POA: Diagnosis not present

## 2023-12-30 DIAGNOSIS — Z7901 Long term (current) use of anticoagulants: Secondary | ICD-10-CM

## 2023-12-30 DIAGNOSIS — Z7951 Long term (current) use of inhaled steroids: Secondary | ICD-10-CM

## 2023-12-30 DIAGNOSIS — F1020 Alcohol dependence, uncomplicated: Secondary | ICD-10-CM | POA: Diagnosis not present

## 2023-12-30 DIAGNOSIS — I82442 Acute embolism and thrombosis of left tibial vein: Secondary | ICD-10-CM

## 2023-12-30 DIAGNOSIS — J4521 Mild intermittent asthma with (acute) exacerbation: Secondary | ICD-10-CM

## 2023-12-30 DIAGNOSIS — J441 Chronic obstructive pulmonary disease with (acute) exacerbation: Secondary | ICD-10-CM

## 2023-12-30 DIAGNOSIS — Z113 Encounter for screening for infections with a predominantly sexual mode of transmission: Secondary | ICD-10-CM | POA: Insufficient documentation

## 2023-12-30 DIAGNOSIS — F5104 Psychophysiologic insomnia: Secondary | ICD-10-CM

## 2023-12-30 MED ORDER — AZELASTINE HCL 0.1 % NA SOLN: 1.0000 | Freq: Every day | NASAL | 12 refills | Status: AC

## 2023-12-30 MED ORDER — IPRATROPIUM BROMIDE 0.02 % IN SOLN: 0.5000 mg | RESPIRATORY_TRACT | 2 refills | Status: AC | PRN

## 2023-12-30 MED ORDER — FLUTICASONE PROPIONATE 50 MCG/ACT NA SUSP: 1.0000 | Freq: Every day | NASAL | 2 refills | Status: AC

## 2023-12-30 MED ORDER — ALBUTEROL SULFATE HFA 108 (90 BASE) MCG/ACT IN AERS: 2.0000 | INHALATION_SPRAY | Freq: Four times a day (QID) | RESPIRATORY_TRACT | 2 refills | Status: AC | PRN

## 2023-12-30 MED ORDER — BUDESONIDE-FORMOTEROL FUMARATE 80-4.5 MCG/ACT IN AERO: 2.0000 | INHALATION_SPRAY | Freq: Two times a day (BID) | RESPIRATORY_TRACT | 12 refills | Status: DC

## 2023-12-30 NOTE — Assessment & Plan Note (Signed)
 Has been drinking less Not as interested as prior.

## 2023-12-30 NOTE — Assessment & Plan Note (Addendum)
 Refilled his nebs and his symbicort  Flu vax today Encouraged him to get covid shot Encouraged him to f/u with Dr Alaine.  Rtc in 6 months

## 2023-12-30 NOTE — Assessment & Plan Note (Signed)
Has completed.

## 2023-12-30 NOTE — Assessment & Plan Note (Signed)
 He appears to be doing well No change in meds Flu vax today Labs today Rtc in 6 months.

## 2023-12-30 NOTE — Progress Notes (Signed)
 Subjective:    Patient ID: Brandon Joyce, male  DOB: Sep 07, 1950, 73 y.o.        MRN: [id number]   HPI 73 yo M with HIV+, statin related myalgias, hyperlipidemia, erectile dysfunction (penoplasty), hypertension, ETOH use.  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 TTE 65-70% Grade 2 diastolic dysfunction (12-2021). He has thoracic aortic aneurysm as well, repeat CTA 01-2022.  Had another work related injury fall 2018, C6 pinched nerve.  He had a nerve block which helped for ~ 48h. He had c-spine surgery 10-23-17.  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     Atripla ---> biktarvy  (12-2016).  No problems with ART    Wife is WC bound, prev bed-sore resolved. Has home health nurse helping. She has moved in with their daughter after infidelity.  He is getting divorced and has a girlfriend.    Has concerns about mother with continence issues and falls (none recently). Has a walker now. Currently having issues with vertigo. Has had EEG monitoring as well.  Has help from his sister. Dad deceased 01-Oct-2021.   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  He was started on eliquis . He's not clear when his stop date is (October- Jan 2025).    States he has had lung fxn testing (obstructive lung dx), breathing test, told he has airway spasm.  He has also been seen by ENT for nasal obstruction- given flonase , mupirocin (not a surgical candidate). No procedures due to his eliquis . He had f/u u/s which looked good.  He has recently seen Dr Brent McQuaid at [facility] for his asthma.     Was seen in ED at [facility] 10-16 for worsening COPD, he was admitted for 3 days- home with symbicort , prednisone  taper. He was also hypertensive.    HIV 1 RNA Quant  Date Value  05/30/2021 NOT DETECTED copies/mL  03/08/2021 Not Detected Copies/mL  05/21/2020 Not Detected Copies/mL   HIV-1 RNA Viral Load (copies/mL)  Date  Value  04/08/2023 80  10/21/2022 40  03/04/2022 <20   CD4 T Cell Abs (/uL)  Date Value  04/08/2023 658  10/21/2022 660  05/30/2021 612     Health Maintenance  Topic Date Due  . Zoster Vaccines- Shingrix (1 of 2) Never done  . Influenza Vaccine  09/25/2023  . Medicare Annual Wellness (AWV)  10/01/2023  . Colonoscopy  07/18/2026  . DTaP/Tdap/Td (2 - Td or Tdap) 12/30/2026  . Pneumococcal Vaccine: 50+ Years  Completed  . Hepatitis C Screening  Completed  . Meningococcal B Vaccine  Aged Out  . Hepatitis B Vaccines 19-59 Average Risk  Discontinued  . COVID-19 Vaccine  Discontinued      Review of Systems  Constitutional:  Negative for chills, fever and weight loss.  HENT:  Positive for congestion.   Respiratory:  Negative for cough, sputum production and shortness of breath (at night when he has nasal congestion.).   Gastrointestinal:  Negative for constipation and diarrhea.  Genitourinary:  Negative for dysuria.    Please see HPI. All other systems reviewed and negative.     Objective:  Physical Exam Vitals reviewed.  Constitutional:      Appearance: Normal appearance. He is obese.  HENT:     Mouth/Throat:     Mouth: Mucous membranes are moist.     Pharynx: No oropharyngeal exudate.  Eyes:  Extraocular Movements: Extraocular movements intact.     Pupils: Pupils are equal, round, and reactive to light.  Cardiovascular:     Rate and Rhythm: Normal rate and regular rhythm.  Pulmonary:     Effort: Pulmonary effort is normal.     Breath sounds: Decreased air movement present.  Abdominal:     General: Bowel sounds are normal. There is no distension.     Palpations: Abdomen is soft.     Tenderness: There is no abdominal tenderness.  Musculoskeletal:        General: Normal range of motion.     Cervical back: Normal range of motion and neck supple.     Right lower leg: No edema.     Left lower leg: No edema.  Neurological:     General: No focal deficit present.      Mental Status: He is alert.           Assessment & Plan:

## 2023-12-30 NOTE — Assessment & Plan Note (Signed)
 Improved (he attributes to prednisone )

## 2023-12-31 LAB — T-HELPER CELLS (CD4) COUNT (NOT AT ARMC)
CD4 % Helper T Cell: 39 % (ref 33–65)
CD4 T Cell Abs: 587 /uL (ref 400–1790)

## 2023-12-31 LAB — URINE CYTOLOGY ANCILLARY ONLY
Chlamydia: NEGATIVE
Comment: NEGATIVE
Comment: NORMAL
Neisseria Gonorrhea: NEGATIVE

## 2024-01-01 LAB — CBC
Hematocrit: 49.9 % (ref 37.5–51.0)
Hemoglobin: 16 g/dL (ref 13.0–17.7)
MCH: 28 pg (ref 26.6–33.0)
MCHC: 32.1 g/dL (ref 31.5–35.7)
MCV: 87 fL (ref 79–97)
Platelets: 280 x10E3/uL (ref 150–450)
RBC: 5.71 x10E6/uL (ref 4.14–5.80)
RDW: 14.4 % (ref 11.6–15.4)
WBC: 6.4 x10E3/uL (ref 3.4–10.8)

## 2024-01-01 LAB — COMPREHENSIVE METABOLIC PANEL WITH GFR
ALT: 24 IU/L (ref 0–44)
AST: 16 IU/L (ref 0–40)
Albumin: 4.6 g/dL (ref 3.8–4.8)
Alkaline Phosphatase: 64 IU/L (ref 47–123)
BUN/Creatinine Ratio: 13 (ref 10–24)
BUN: 15 mg/dL (ref 8–27)
Bilirubin Total: 0.3 mg/dL (ref 0.0–1.2)
CO2: 19 mmol/L — ABNORMAL LOW (ref 20–29)
Calcium: 9.7 mg/dL (ref 8.6–10.2)
Chloride: 106 mmol/L (ref 96–106)
Creatinine, Ser: 1.17 mg/dL (ref 0.76–1.27)
Globulin, Total: 2.3 g/dL (ref 1.5–4.5)
Glucose: 106 mg/dL — ABNORMAL HIGH (ref 70–99)
Potassium: 4.5 mmol/L (ref 3.5–5.2)
Sodium: 141 mmol/L (ref 134–144)
Total Protein: 6.9 g/dL (ref 6.0–8.5)
eGFR: 66 mL/min/1.73 (ref >59)

## 2024-01-01 LAB — LIPID PANEL
Chol/HDL Ratio: 4.9 ratio (ref 0.0–5.0)
Cholesterol, Total: 182 mg/dL (ref 100–199)
HDL: 37 mg/dL — ABNORMAL LOW (ref >39)
LDL Chol Calc (NIH): 86 mg/dL (ref 0–99)
Triglycerides: 359 mg/dL — ABNORMAL HIGH (ref 0–149)
VLDL Cholesterol Cal: 59 mg/dL — ABNORMAL HIGH (ref 5–40)

## 2024-01-01 LAB — RPR: RPR Ser Ql: NONREACTIVE

## 2024-01-01 LAB — HIV-1 RNA QUANT-NO REFLEX-BLD: HIV-1 RNA Viral Load: <20 {copies}/mL

## 2024-01-10 DIAGNOSIS — R110 Nausea: Secondary | ICD-10-CM | POA: Diagnosis not present

## 2024-01-10 DIAGNOSIS — R9431 Abnormal electrocardiogram [ECG] [EKG]: Secondary | ICD-10-CM | POA: Diagnosis not present

## 2024-01-10 DIAGNOSIS — I10 Essential (primary) hypertension: Secondary | ICD-10-CM | POA: Diagnosis not present

## 2024-01-10 DIAGNOSIS — R42 Dizziness and giddiness: Secondary | ICD-10-CM | POA: Diagnosis not present

## 2024-01-10 DIAGNOSIS — R0602 Shortness of breath: Secondary | ICD-10-CM | POA: Diagnosis not present

## 2024-01-10 DIAGNOSIS — Z79899 Other long term (current) drug therapy: Secondary | ICD-10-CM | POA: Diagnosis not present

## 2024-01-10 DIAGNOSIS — Z87891 Personal history of nicotine dependence: Secondary | ICD-10-CM | POA: Diagnosis not present

## 2024-01-10 DIAGNOSIS — R55 Syncope and collapse: Secondary | ICD-10-CM | POA: Diagnosis not present

## 2024-01-11 ENCOUNTER — Other Ambulatory Visit: Payer: Self-pay | Admitting: Infectious Diseases

## 2024-01-11 DIAGNOSIS — B20 Human immunodeficiency virus [HIV] disease: Secondary | ICD-10-CM

## 2024-01-25 ENCOUNTER — Telehealth: Payer: Self-pay | Admitting: Infectious Diseases

## 2024-01-25 DIAGNOSIS — J4521 Mild intermittent asthma with (acute) exacerbation: Secondary | ICD-10-CM

## 2024-01-25 NOTE — Telephone Encounter (Unsigned)
 Copied from CRM #[id number]. Topic: Clinical - Medication Refill >> Jan 25, 2024 11:50 AM Debby BROCKS wrote: Medication: budesonide -formoterol  (SYMBICORT ) 80-4.5 MCG/ACT inhaler  Has the patient contacted their pharmacy? Yes (Agent: If no, request that the patient contact the pharmacy for the refill. If patient does not wish to contact the pharmacy document the reason why and proceed with request.) (Agent: If yes, when and what did the pharmacy advise?)  This is the patient's preferred pharmacy:  [id number] - [place], [place] - [address] [place] [address] Phone: [phone] Fax: [phone]  Is this the correct pharmacy for this prescription? Yes If no, delete pharmacy and type the correct one.   Has the prescription been filled recently? No  Is the patient out of the medication? No  Has the patient been seen for an appointment in the last year OR does the patient have an upcoming appointment? Yes  Can we respond through MyChart? Yes  Agent: Please be advised that Rx refills may take up to 3 business days. We ask that you follow-up with your pharmacy.

## 2024-01-26 ENCOUNTER — Other Ambulatory Visit (HOSPITAL_BASED_OUTPATIENT_CLINIC_OR_DEPARTMENT_OTHER): Payer: Self-pay

## 2024-01-26 MED ORDER — BUDESONIDE-FORMOTEROL FUMARATE 80-4.5 MCG/ACT IN AERO
2.0000 | INHALATION_SPRAY | Freq: Two times a day (BID) | RESPIRATORY_TRACT | 12 refills | Status: AC
  Filled 2024-01-26: qty 10.2, 30d supply, fill #0

## 2024-01-29 ENCOUNTER — Other Ambulatory Visit (HOSPITAL_COMMUNITY): Payer: Self-pay

## 2024-02-09 ENCOUNTER — Encounter: Payer: Self-pay | Admitting: *Deleted

## 2024-03-29 ENCOUNTER — Telehealth: Payer: Self-pay | Admitting: Infectious Diseases

## 2024-03-29 NOTE — Telephone Encounter (Signed)
 Please review message below.  Attempted to contact patient, but no answer and mailbox is full.  If patient calls back please transfer call directly to the office.   Copied from CRM #[id number]. Topic: Appointments - Scheduling Inquiry for Clinic >> Mar 23, 2024  4:27 PM Brandon Joyce wrote: Reason for CRM: Patient called to schedule follow up with Doctor Eben. No new symptoms but needs to discuss something with him. Did not want to provide additional information - states it was personal. Thank You
